# Patient Record
Sex: Female | Born: 1960
Health system: Southern US, Community
[De-identification: ages and names within clinical notes are randomized; demographics above are authoritative.]

## PROBLEM LIST (undated history)

## (undated) DIAGNOSIS — Z9221 Personal history of antineoplastic chemotherapy: Secondary | ICD-10-CM

## (undated) DIAGNOSIS — R682 Dry mouth, unspecified: Secondary | ICD-10-CM

## (undated) DIAGNOSIS — C8590 Non-Hodgkin lymphoma, unspecified, unspecified site: Secondary | ICD-10-CM

## (undated) DIAGNOSIS — Z923 Personal history of irradiation: Secondary | ICD-10-CM

## (undated) HISTORY — PX: TONSILLECTOMY: SUR1361

## (undated) HISTORY — PX: BREAST EXCISIONAL BIOPSY: SUR124

## (undated) HISTORY — PX: CHOLECYSTECTOMY: SHX55

---

## 2001-11-02 ENCOUNTER — Encounter: Admission: RE | Admit: 2001-11-02 | Discharge: 2001-11-02 | Payer: Self-pay | Admitting: Obstetrics and Gynecology

## 2001-11-02 ENCOUNTER — Encounter: Payer: Self-pay | Admitting: Obstetrics and Gynecology

## 2001-12-27 ENCOUNTER — Other Ambulatory Visit: Admission: RE | Admit: 2001-12-27 | Discharge: 2001-12-27 | Payer: Self-pay | Admitting: Obstetrics and Gynecology

## 2002-12-04 ENCOUNTER — Encounter: Admission: RE | Admit: 2002-12-04 | Discharge: 2002-12-04 | Payer: Self-pay | Admitting: Family Medicine

## 2003-01-09 ENCOUNTER — Other Ambulatory Visit: Admission: RE | Admit: 2003-01-09 | Discharge: 2003-01-09 | Payer: Self-pay | Admitting: Obstetrics and Gynecology

## 2004-02-04 ENCOUNTER — Encounter: Admission: RE | Admit: 2004-02-04 | Discharge: 2004-02-04 | Payer: Self-pay | Admitting: Family Medicine

## 2004-10-05 ENCOUNTER — Other Ambulatory Visit: Admission: RE | Admit: 2004-10-05 | Discharge: 2004-10-05 | Payer: Self-pay | Admitting: Obstetrics and Gynecology

## 2005-05-12 ENCOUNTER — Encounter: Admission: RE | Admit: 2005-05-12 | Discharge: 2005-05-12 | Payer: Self-pay | Admitting: Family Medicine

## 2005-06-16 ENCOUNTER — Encounter: Admission: RE | Admit: 2005-06-16 | Discharge: 2005-06-16 | Payer: Self-pay | Admitting: Family Medicine

## 2006-07-05 ENCOUNTER — Encounter: Admission: RE | Admit: 2006-07-05 | Discharge: 2006-07-05 | Payer: Self-pay | Admitting: Family Medicine

## 2006-11-12 ENCOUNTER — Emergency Department (HOSPITAL_COMMUNITY): Admission: EM | Admit: 2006-11-12 | Discharge: 2006-11-12 | Payer: Self-pay | Admitting: Emergency Medicine

## 2007-07-13 ENCOUNTER — Encounter: Admission: RE | Admit: 2007-07-13 | Discharge: 2007-07-13 | Payer: Self-pay | Admitting: Family Medicine

## 2010-02-15 ENCOUNTER — Encounter: Payer: Self-pay | Admitting: Family Medicine

## 2010-11-04 LAB — CBC
HCT: 35.5 — ABNORMAL LOW
MCHC: 33.1
MCV: 87.2
Platelets: 275
WBC: 5.4

## 2010-11-04 LAB — I-STAT 8, (EC8 V) (CONVERTED LAB)
Acid-Base Excess: 1
Chloride: 104
HCT: 38
Operator id: 265201
pCO2, Ven: 50.1 — ABNORMAL HIGH

## 2010-11-04 LAB — DIFFERENTIAL
Basophils Relative: 1
Eosinophils Relative: 2
Lymphocytes Relative: 48 — ABNORMAL HIGH
Monocytes Absolute: 0.4
Monocytes Relative: 7

## 2010-11-04 LAB — POCT CARDIAC MARKERS
CKMB, poc: 4.8
Myoglobin, poc: 35
Operator id: 265201
Troponin i, poc: <0.05

## 2010-11-04 LAB — POCT I-STAT CREATININE: Operator id: 265201

## 2010-12-22 ENCOUNTER — Other Ambulatory Visit: Payer: Self-pay | Admitting: Family Medicine

## 2010-12-22 ENCOUNTER — Ambulatory Visit
Admission: RE | Admit: 2010-12-22 | Discharge: 2010-12-22 | Disposition: A | Discharge status: Home / Self Care | Payer: Federal, State, Local not specified - PPO | Source: Ambulatory Visit | Attending: Family Medicine | Admitting: Family Medicine

## 2010-12-22 DIAGNOSIS — R059 Cough, unspecified: Secondary | ICD-10-CM

## 2010-12-22 DIAGNOSIS — R05 Cough: Secondary | ICD-10-CM

## 2011-03-11 ENCOUNTER — Other Ambulatory Visit: Payer: Self-pay | Admitting: Family Medicine

## 2011-03-11 DIAGNOSIS — Z1231 Encounter for screening mammogram for malignant neoplasm of breast: Secondary | ICD-10-CM

## 2011-03-17 ENCOUNTER — Ambulatory Visit
Admission: RE | Admit: 2011-03-17 | Discharge: 2011-03-17 | Disposition: A | Discharge status: Home / Self Care | Payer: Federal, State, Local not specified - PPO | Source: Ambulatory Visit | Attending: Family Medicine | Admitting: Family Medicine

## 2011-03-17 DIAGNOSIS — Z1231 Encounter for screening mammogram for malignant neoplasm of breast: Secondary | ICD-10-CM

## 2011-04-28 ENCOUNTER — Ambulatory Visit (INDEPENDENT_AMBULATORY_CARE_PROVIDER_SITE_OTHER): Payer: Federal, State, Local not specified - PPO | Admitting: Obstetrics and Gynecology

## 2011-04-28 DIAGNOSIS — Z01419 Encounter for gynecological examination (general) (routine) without abnormal findings: Secondary | ICD-10-CM

## 2011-04-28 DIAGNOSIS — Z124 Encounter for screening for malignant neoplasm of cervix: Secondary | ICD-10-CM

## 2012-08-28 ENCOUNTER — Other Ambulatory Visit: Payer: Self-pay

## 2012-08-28 DIAGNOSIS — Z1231 Encounter for screening mammogram for malignant neoplasm of breast: Secondary | ICD-10-CM

## 2012-09-08 ENCOUNTER — Ambulatory Visit
Admission: RE | Admit: 2012-09-08 | Discharge: 2012-09-08 | Disposition: A | Discharge status: Home / Self Care | Payer: Federal, State, Local not specified - PPO | Source: Ambulatory Visit

## 2012-09-08 DIAGNOSIS — Z1231 Encounter for screening mammogram for malignant neoplasm of breast: Secondary | ICD-10-CM

## 2013-04-18 ENCOUNTER — Ambulatory Visit (INDEPENDENT_AMBULATORY_CARE_PROVIDER_SITE_OTHER): Payer: Federal, State, Local not specified - PPO | Admitting: Podiatry

## 2013-04-18 ENCOUNTER — Encounter: Payer: Self-pay | Admitting: Podiatry

## 2013-04-18 VITALS — BP 122/73 | HR 69 | Resp 20 | Ht 63.0 in | Wt 198.0 lb

## 2013-04-18 DIAGNOSIS — M722 Plantar fascial fibromatosis: Secondary | ICD-10-CM

## 2013-04-18 NOTE — Patient Instructions (Addendum)

## 2013-04-19 NOTE — Progress Notes (Signed)
Subjective:     Patient ID: Charlotte Davidson, female   DOB: 1960/11/21, 53 y.o.   MRN: [id number]  HPI patient states that she is still getting discomfort in her heels and that she admits she shouldn't been here several months ago she forgot about the appointment and has not been able to pick up her orthotics   Review of Systems     Objective:   Physical Exam Neurovascular status intact with discomfort in the plantar heel region of both feet at the insertion of the tendon the calcaneus of a moderate nature    Assessment:     Continued chronic plantar fasciitis of the heel region both feet that has not responded so far to conservative care    Plan:     Orthotics dispensed with instructions and reviewed physical therapy and stretching exercises. Dispensed night splint with instructions on usage to try to reduce the inflammation she continues to experience on a chronic basis reappoint 4 weeks

## 2013-05-16 ENCOUNTER — Encounter: Payer: Self-pay | Admitting: Podiatry

## 2013-05-16 ENCOUNTER — Ambulatory Visit (INDEPENDENT_AMBULATORY_CARE_PROVIDER_SITE_OTHER): Payer: Federal, State, Local not specified - PPO | Admitting: Podiatry

## 2013-05-16 VITALS — BP 122/73 | HR 76 | Resp 12

## 2013-05-16 DIAGNOSIS — M722 Plantar fascial fibromatosis: Secondary | ICD-10-CM

## 2013-05-17 NOTE — Progress Notes (Signed)
Subjective:     Patient ID: Charlotte Davidson, female   DOB: [birth year], 53 y.o.   MRN: [id number]  HPI patient states that both of my heels are feeling quite a bit better with mild discomfort with excessive ambulation but I am walking with a better heel toe gait   Review of Systems     Objective:   Physical Exam Neurovascular status intact with minimal discomfort to palpation plantar heel bilateral and orthotics fitting well    Assessment:     Plantar fasciitis bilateral which is improving with conservative care    Plan:     Advised on physical therapy continued anti-inflammatories and orthotics and gradual increase in activity. Reappoint as needed

## 2013-10-18 ENCOUNTER — Other Ambulatory Visit: Payer: Self-pay

## 2013-10-18 DIAGNOSIS — Z1231 Encounter for screening mammogram for malignant neoplasm of breast: Secondary | ICD-10-CM

## 2013-10-25 ENCOUNTER — Ambulatory Visit
Admission: RE | Admit: 2013-10-25 | Discharge: 2013-10-25 | Disposition: A | Discharge status: Home / Self Care | Payer: Federal, State, Local not specified - PPO | Source: Ambulatory Visit

## 2013-10-25 DIAGNOSIS — Z1231 Encounter for screening mammogram for malignant neoplasm of breast: Secondary | ICD-10-CM

## 2013-10-26 ENCOUNTER — Ambulatory Visit: Payer: Federal, State, Local not specified - PPO

## 2014-10-29 ENCOUNTER — Other Ambulatory Visit: Payer: Self-pay

## 2014-10-29 DIAGNOSIS — Z1231 Encounter for screening mammogram for malignant neoplasm of breast: Secondary | ICD-10-CM

## 2014-11-08 ENCOUNTER — Ambulatory Visit
Admission: RE | Admit: 2014-11-08 | Discharge: 2014-11-08 | Disposition: A | Discharge status: Home / Self Care | Payer: 59 | Source: Ambulatory Visit

## 2014-11-08 DIAGNOSIS — Z1231 Encounter for screening mammogram for malignant neoplasm of breast: Secondary | ICD-10-CM

## 2015-01-22 ENCOUNTER — Other Ambulatory Visit: Payer: Self-pay | Admitting: Adult Health

## 2015-01-22 DIAGNOSIS — N6310 Unspecified lump in the right breast, unspecified quadrant: Secondary | ICD-10-CM

## 2015-01-24 ENCOUNTER — Other Ambulatory Visit: Payer: Self-pay | Admitting: Adult Health

## 2015-01-24 ENCOUNTER — Ambulatory Visit
Admission: RE | Admit: 2015-01-24 | Discharge: 2015-01-24 | Disposition: A | Discharge status: Home / Self Care | Payer: 59 | Source: Ambulatory Visit | Attending: Internal Medicine | Admitting: Internal Medicine

## 2015-01-24 DIAGNOSIS — N6310 Unspecified lump in the right breast, unspecified quadrant: Secondary | ICD-10-CM

## 2015-01-24 DIAGNOSIS — N6001 Solitary cyst of right breast: Secondary | ICD-10-CM

## 2015-01-24 HISTORY — PX: BREAST CYST ASPIRATION: SHX578

## 2015-01-27 LAB — CULTURE, ROUTINE-ABSCESS

## 2015-02-07 ENCOUNTER — Other Ambulatory Visit: Payer: 59

## 2015-02-10 ENCOUNTER — Ambulatory Visit
Admission: RE | Admit: 2015-02-10 | Discharge: 2015-02-10 | Disposition: A | Discharge status: Home / Self Care | Payer: 59 | Source: Ambulatory Visit | Attending: Internal Medicine | Admitting: Internal Medicine

## 2015-02-10 ENCOUNTER — Other Ambulatory Visit: Payer: Self-pay | Admitting: Adult Health

## 2015-02-10 ENCOUNTER — Ambulatory Visit: Payer: Self-pay | Admitting: Surgery

## 2015-02-10 DIAGNOSIS — N6310 Unspecified lump in the right breast, unspecified quadrant: Secondary | ICD-10-CM

## 2015-02-10 NOTE — H&P (Signed)
[facility]  Location: [facility] Patient #: [id number] DOB: 12/05/1960 Married / Language: English / Race: Black or African American Female   History of Present Illness    The patient is a 55 year old female who presents with a breast abscess.  Her PCP is Dr. Susie Cassette.  She comes by herself.   The patient was seen in [place] on 24 January 2015 for a right breast mass. She she underwent an ultrasound and mammogram. There was a 3.5 x 2.2 cm hypoechoic fluid collection at the 12 o'clock position felt to be secondary to an abscess. She underwent an aspiration. She was placed on doxycycline. Her final cultures revealed Proteus mirabilis.   She was seen back at the [place] today (02/10/2015). Repeat US showed 4.2 cm x 4.3 cm mass felt to be consistent with an abscess. She also had some enlarged right axillary lymph nodes felt to be reactive. She was referred to our office. I am seeing her in the Urgent Office. The [facility] did not push her [place] to Epic.  She has no history of prior breast abscess. She had a negative mammogram in Oct 2016 at the [facility]. She has no family history of breast cancer.  I discussed with her about continued medical vs surgical options. She has failed aspiration and antibiotics and I think needs open drainage. The mass is big enough and deep enough that I think this would best be done in the OR. I spoke with Dr. Dalbert Batman, who is our surgeon of the week at [facility], and he will do her tomorrow. Because of her allergies, I put her on cipro x 10 days. I discussed the potential complications of surgery, including, but not limited to, bleeding, recurrent abscess, nerve injury, and cosmetic changes.  Past Medical History: 1. Morbid obesity 2. Pulled her back yesterday at church. It is still bothering her. She has not seen anyone for this. 3. Remote history of cholecystectomy by Dr.  Bubba Camp  Allergies: Sulfa and Penicillin - these are remote  Social History: Married. She works at the [employer]. She has two children  Other Problems Charlotte Davidson, Charlotte Davidson; 02/10/2015 4:23 PM) Arthritis Asthma Bladder Problems Cholelithiasis Heart murmur  Past Surgical History Charlotte Davidson, Charlotte Davidson; 02/10/2015 4:23 PM) Cesarean Section - 1 Gallbladder Surgery - Laparoscopic Oral Surgery Tonsillectomy  Diagnostic Studies History Charlotte Davidson, Charlotte Davidson; 02/10/2015 4:23 PM) Colonoscopy 1-5 years ago Mammogram within last year  Allergies Charlotte Davidson, Charlotte Davidson; 02/10/2015 4:24 PM) Latex Exam Gloves *MEDICAL DEVICES AND SUPPLIES* Sulfa Antibiotics  Medication History Charlotte Davidson, Charlotte Davidson; 02/10/2015 4:25 PM) ZyrTEC Allergy (10MG  Capsule, Oral) Active. Flonase (50MCG/ACT Suspension, Nasal) Active. Medications Reconciled Multivitamins (Oral) Active.  Social History Charlotte Davidson, Charlotte Davidson; 02/10/2015 4:23 PM) Alcohol use Remotely quit alcohol use. Caffeine use Coffee, Tea. No drug use Tobacco use Never smoker.  Family History Charlotte Davidson, [place]; 02/10/2015 4:23 PM) Arthritis Mother. Cerebrovascular Accident Mother. Cervical Cancer Mother. Heart disease in female family member before age 60 Hypertension Mother. Migraine Headache Daughter.  Pregnancy / Birth History Charlotte Davidson, Charlotte Davidson; 02/10/2015 4:23 PM) Age at menarche 77 years. Gravida 2 Maternal age 62-30 Para 2  Review of Systems (Charlotte Davidson; 02/10/2015 4:23 PM) General Not Present- Appetite Loss, Chills, Fatigue, Fever, Night Sweats, Weight Gain and Weight Loss. Skin Not Present- Change in Wart/Mole, Dryness, Hives, Jaundice, New Lesions, Non-Healing Wounds, Rash and Ulcer. HEENT Present- Sinus Pain and Wears glasses/contact lenses. Not Present- Earache, Hearing Loss,  Hoarseness, Nose Bleed, Oral Ulcers, Ringing in the Ears, Seasonal Allergies, Sore Throat, Visual Disturbances and Yellow Eyes. Respiratory  Present- Snoring. Not Present- Bloody sputum, Chronic Cough, Difficulty Breathing and Wheezing. Breast Present- Breast Pain. Not Present- Breast Mass, Nipple Discharge and Skin Changes. Cardiovascular Not Present- Chest Pain, Difficulty Breathing Lying Down, Leg Cramps, Palpitations, Rapid Heart Rate, Shortness of Breath and Swelling of Extremities. Gastrointestinal Not Present- Abdominal Pain, Bloating, Bloody Stool, Change in Bowel Habits, Chronic diarrhea, Constipation, Difficulty Swallowing, Excessive gas, Gets full quickly at meals, Hemorrhoids, Indigestion, Nausea, Rectal Pain and Vomiting. Female Genitourinary Present- Frequency. Not Present- Nocturia, Painful Urination, Pelvic Pain and Urgency. Musculoskeletal Present- Back Pain. Not Present- Joint Pain, Joint Stiffness, Muscle Pain, Muscle Weakness and Swelling of Extremities. Neurological Present- Headaches. Not Present- Decreased Memory, Fainting, Numbness, Seizures, Tingling, Tremor, Trouble walking and Weakness. Psychiatric Not Present- Anxiety, Bipolar, Change in Sleep Pattern, Depression, Fearful and Frequent crying. Endocrine Present- Hot flashes. Not Present- Cold Intolerance, Excessive Hunger, Hair Changes, Heat Intolerance and New Diabetes. Hematology Not Present- Easy Bruising, Excessive bleeding, Gland problems, HIV and Persistent Infections.  Vitals (Charlotte Davidson; 02/10/2015 4:23 PM) 02/10/2015 4:23 PM Weight: 216 lb Height: 64in Body Surface Area: 2.02 m Body Mass Index: 37.08 kg/m  Temp.: 66F(Temporal)  Pulse: 81 (Regular)  BP: 124/76 (Sitting, Left Arm, Standard)  Physical Exam  General: Obese AA obese F alert and generally healthy appearing. HEENT: Normal. Pupils equal.  Neck: Supple. No mass. No thyroid mass. Lymph Nodes: No supraclavicular or cervical or axillary nodes.  Lungs: Clear to auscultation and symmetric breath sounds. Heart: RRR. No murmur or rub.  Breasts: Right - 5 cm mass in upper  right breast just beyond the areola. The skin is red, the mass is tender. Consistent with an abscess.  But I think some of this is chronic indurated tissue. Left - no mass  Abdomen: Soft. No mass. No tenderness. No hernia. Normal bowel sounds.  Extremities: Good strength and ROM in upper and lower extremities.  Neurologic: Grossly intact to motor and sensory function. Psychiatric: Has normal mood and affect. Behavior is normal.  Assessment & Plan  1.  ABSCESS OF BREAST, RIGHT (N61.1)  Impression: Failed aspiration and antibiotics.   For I&D tomorrow at [facility] with Dr. Dalbert Batman.  Current Plans:  Started Ciprofloxacin HCl 500MG , 1 (one) Tablet two times daily, #20, 02/10/2015, No Refill.   Go to short stay at [facility] at 7 AM tomorrow. Your surgery is on for 9 Am tomorrow with Dr. Leane Para.  2.  Back pain - acute  Alphonsa Overall, MD, [facility] Surgery Pager: [phone] Office phone:  [phone]

## 2015-02-11 ENCOUNTER — Encounter (HOSPITAL_COMMUNITY): Payer: Self-pay | Admitting: *Deleted

## 2015-02-11 ENCOUNTER — Ambulatory Visit (HOSPITAL_COMMUNITY): Payer: 59 | Admitting: Anesthesiology

## 2015-02-11 ENCOUNTER — Encounter (HOSPITAL_COMMUNITY)
Admission: RE | Disposition: A | Discharge status: Home / Self Care | Payer: Self-pay | Source: Ambulatory Visit | Attending: General Surgery

## 2015-02-11 ENCOUNTER — Ambulatory Visit (HOSPITAL_COMMUNITY)
Admission: RE | Admit: 2015-02-11 | Discharge: 2015-02-11 | Disposition: A | Discharge status: Home / Self Care | Payer: 59 | Source: Ambulatory Visit | Attending: General Surgery | Admitting: General Surgery

## 2015-02-11 DIAGNOSIS — M1990 Unspecified osteoarthritis, unspecified site: Secondary | ICD-10-CM | POA: Insufficient documentation

## 2015-02-11 DIAGNOSIS — Z6837 Body mass index (BMI) 37.0-37.9, adult: Secondary | ICD-10-CM | POA: Diagnosis not present

## 2015-02-11 DIAGNOSIS — N611 Abscess of the breast and nipple: Secondary | ICD-10-CM | POA: Diagnosis present

## 2015-02-11 DIAGNOSIS — J45909 Unspecified asthma, uncomplicated: Secondary | ICD-10-CM | POA: Insufficient documentation

## 2015-02-11 DIAGNOSIS — M549 Dorsalgia, unspecified: Secondary | ICD-10-CM | POA: Insufficient documentation

## 2015-02-11 HISTORY — PX: BREAST SURGERY: SHX581

## 2015-02-11 HISTORY — PX: INCISION AND DRAINAGE ABSCESS: SHX5864

## 2015-02-11 LAB — CBC WITH DIFFERENTIAL/PLATELET
BASOS ABS: 0 10*3/uL (ref 0.0–0.1)
BASOS PCT: 0 %
Eosinophils Absolute: 0.1 10*3/uL (ref 0.0–0.7)
Eosinophils Relative: 1 %
HEMATOCRIT: 38.6 % (ref 36.0–46.0)
HEMOGLOBIN: 12.5 g/dL (ref 12.0–15.0)
LYMPHS PCT: 32 %
Lymphs Abs: 2.4 10*3/uL (ref 0.7–4.0)
MCH: 27.5 pg (ref 26.0–34.0)
MCHC: 32.4 g/dL (ref 30.0–36.0)
MCV: 85 fL (ref 78.0–100.0)
MONO ABS: 0.5 10*3/uL (ref 0.1–1.0)
MONOS PCT: 7 %
NEUTROS ABS: 4.3 10*3/uL (ref 1.7–7.7)
NEUTROS PCT: 60 %
Platelets: 291 10*3/uL (ref 150–400)
RBC: 4.54 MIL/uL (ref 3.87–5.11)
RDW: 13.1 % (ref 11.5–15.5)
WBC: 7.3 10*3/uL (ref 4.0–10.5)

## 2015-02-11 SURGERY — INCISION AND DRAINAGE, ABSCESS
Anesthesia: General | Site: Breast | Laterality: Right

## 2015-02-11 MED ORDER — CHLORHEXIDINE GLUCONATE 4 % EX LIQD: 1.0000 "application " | Freq: Once | CUTANEOUS | Status: DC

## 2015-02-11 MED ORDER — LACTATED RINGERS IV SOLN
INTRAVENOUS | Status: DC
  Administered 2015-02-11: 11:00:00 via INTRAVENOUS
  Administered 2015-02-11: 1000 mL via INTRAVENOUS

## 2015-02-11 MED ORDER — ONDANSETRON HCL 4 MG/2ML IJ SOLN
INTRAMUSCULAR | Status: AC
  Filled 2015-02-11: qty 2

## 2015-02-11 MED ORDER — MIDAZOLAM HCL 5 MG/5ML IJ SOLN
INTRAMUSCULAR | Status: DC | PRN
  Administered 2015-02-11 (×3): 1 mg via INTRAVENOUS

## 2015-02-11 MED ORDER — MIDAZOLAM HCL 2 MG/2ML IJ SOLN
INTRAMUSCULAR | Status: AC
  Filled 2015-02-11: qty 2

## 2015-02-11 MED ORDER — PROPOFOL 10 MG/ML IV BOLUS
INTRAVENOUS | Status: AC
  Filled 2015-02-11: qty 20

## 2015-02-11 MED ORDER — FENTANYL CITRATE (PF) 100 MCG/2ML IJ SOLN
INTRAMUSCULAR | Status: AC
  Filled 2015-02-11: qty 2

## 2015-02-11 MED ORDER — FENTANYL CITRATE (PF) 100 MCG/2ML IJ SOLN: 25.0000 ug | INTRAMUSCULAR | Status: DC | PRN

## 2015-02-11 MED ORDER — 0.9 % SODIUM CHLORIDE (POUR BTL) OPTIME
TOPICAL | Status: DC | PRN
  Administered 2015-02-11: 2000 mL

## 2015-02-11 MED ORDER — ONDANSETRON HCL 4 MG/2ML IJ SOLN
INTRAMUSCULAR | Status: DC | PRN
  Administered 2015-02-11 (×2): 2 mg via INTRAVENOUS

## 2015-02-11 MED ORDER — LIDOCAINE HCL (CARDIAC) 20 MG/ML IV SOLN
INTRAVENOUS | Status: DC | PRN
  Administered 2015-02-11: 75 mg via INTRAVENOUS

## 2015-02-11 MED ORDER — SODIUM CHLORIDE 0.9 % IV SOLN: INTRAVENOUS | Status: DC

## 2015-02-11 MED ORDER — ACETAMINOPHEN 650 MG RE SUPP
650.0000 mg | RECTAL | Status: DC | PRN
  Filled 2015-02-11: qty 1

## 2015-02-11 MED ORDER — FENTANYL CITRATE (PF) 100 MCG/2ML IJ SOLN
INTRAMUSCULAR | Status: DC | PRN
  Administered 2015-02-11 (×2): 50 ug via INTRAVENOUS

## 2015-02-11 MED ORDER — FENTANYL CITRATE (PF) 100 MCG/2ML IJ SOLN
25.0000 ug | INTRAMUSCULAR | Status: DC | PRN
  Administered 2015-02-11: 25 ug via INTRAVENOUS

## 2015-02-11 MED ORDER — DEXAMETHASONE SODIUM PHOSPHATE 10 MG/ML IJ SOLN
INTRAMUSCULAR | Status: AC
  Filled 2015-02-11: qty 1

## 2015-02-11 MED ORDER — OXYCODONE HCL 5 MG PO TABS: 5.0000 mg | ORAL_TABLET | Freq: Once | ORAL | Status: DC | PRN

## 2015-02-11 MED ORDER — ACETAMINOPHEN 325 MG PO TABS: 650.0000 mg | ORAL_TABLET | ORAL | Status: DC | PRN

## 2015-02-11 MED ORDER — PROPOFOL 10 MG/ML IV BOLUS
INTRAVENOUS | Status: DC | PRN
  Administered 2015-02-11: 200 mg via INTRAVENOUS

## 2015-02-11 MED ORDER — CIPROFLOXACIN IN D5W 400 MG/200ML IV SOLN
INTRAVENOUS | Status: AC
  Filled 2015-02-11: qty 200

## 2015-02-11 MED ORDER — OXYCODONE HCL 5 MG/5ML PO SOLN
5.0000 mg | Freq: Once | ORAL | Status: DC | PRN
  Filled 2015-02-11: qty 5

## 2015-02-11 MED ORDER — ONDANSETRON HCL 4 MG/2ML IJ SOLN
4.0000 mg | Freq: Once | INTRAMUSCULAR | Status: AC | PRN
  Administered 2015-02-11: 4 mg via INTRAVENOUS
  Filled 2015-02-11: qty 2

## 2015-02-11 MED ORDER — SODIUM CHLORIDE 0.9 % IJ SOLN: 3.0000 mL | Freq: Two times a day (BID) | INTRAMUSCULAR | Status: DC

## 2015-02-11 MED ORDER — SODIUM CHLORIDE 0.9 % IJ SOLN: 3.0000 mL | INTRAMUSCULAR | Status: DC | PRN

## 2015-02-11 MED ORDER — CIPROFLOXACIN IN D5W 400 MG/200ML IV SOLN
400.0000 mg | INTRAVENOUS | Status: AC
  Administered 2015-02-11: 400 mg via INTRAVENOUS

## 2015-02-11 MED ORDER — OXYCODONE HCL 5 MG PO TABS: 5.0000 mg | ORAL_TABLET | ORAL | Status: DC | PRN

## 2015-02-11 MED ORDER — HYDROCODONE-ACETAMINOPHEN 5-325 MG PO TABS: 1.0000 | ORAL_TABLET | Freq: Four times a day (QID) | ORAL | Status: DC | PRN

## 2015-02-11 MED ORDER — DEXAMETHASONE SODIUM PHOSPHATE 10 MG/ML IJ SOLN
INTRAMUSCULAR | Status: DC | PRN
  Administered 2015-02-11: 10 mg via INTRAVENOUS

## 2015-02-11 MED ORDER — BUPIVACAINE-EPINEPHRINE (PF) 0.5% -1:200000 IJ SOLN
INTRAMUSCULAR | Status: AC
  Filled 2015-02-11: qty 30

## 2015-02-11 MED ORDER — FENTANYL CITRATE (PF) 250 MCG/5ML IJ SOLN
INTRAMUSCULAR | Status: AC
Start: 2015-02-11 — End: 2015-02-11
  Filled 2015-02-11: qty 5

## 2015-02-11 MED ORDER — SODIUM CHLORIDE 0.9 % IV SOLN: 250.0000 mL | INTRAVENOUS | Status: DC | PRN

## 2015-02-11 MED ORDER — BUPIVACAINE-EPINEPHRINE 0.5% -1:200000 IJ SOLN
INTRAMUSCULAR | Status: DC | PRN
  Administered 2015-02-11: 10 mL

## 2015-02-11 MED ORDER — PHENYLEPHRINE HCL 10 MG/ML IJ SOLN
INTRAMUSCULAR | Status: DC | PRN
  Administered 2015-02-11 (×4): 40 ug via INTRAVENOUS

## 2015-02-11 MED ORDER — LIDOCAINE HCL (CARDIAC) 20 MG/ML IV SOLN
INTRAVENOUS | Status: AC
  Filled 2015-02-11: qty 5

## 2015-02-11 SURGICAL SUPPLY — 30 items
BINDER BREAST XLRG (GAUZE/BANDAGES/DRESSINGS) ×1 IMPLANT
BLADE SURG 15 STRL LF DISP TIS (BLADE) ×1 IMPLANT
BLADE SURG 15 STRL SS (BLADE) ×2
BNDG GAUZE ELAST 4 BULKY (GAUZE/BANDAGES/DRESSINGS) IMPLANT
COVER SURGICAL LIGHT HANDLE (MISCELLANEOUS) ×2 IMPLANT
DECANTER SPIKE VIAL GLASS SM (MISCELLANEOUS) IMPLANT
DRAPE LAPAROSCOPIC ABDOMINAL (DRAPES) IMPLANT
DRSG PAD ABDOMINAL 8X10 ST (GAUZE/BANDAGES/DRESSINGS) ×1 IMPLANT
ELECT PENCIL ROCKER SW 15FT (MISCELLANEOUS) ×2 IMPLANT
ELECT REM PT RETURN 9FT ADLT (ELECTROSURGICAL) ×2
ELECTRODE REM PT RTRN 9FT ADLT (ELECTROSURGICAL) ×1 IMPLANT
GAUZE SPONGE 4X4 12PLY STRL (GAUZE/BANDAGES/DRESSINGS) IMPLANT
GLOVE SURG SIGNA 7.5 PF LTX (GLOVE) ×2 IMPLANT
GOWN STRL REUS W/TWL XL LVL3 (GOWN DISPOSABLE) ×4 IMPLANT
KIT BASIN OR (CUSTOM PROCEDURE TRAY) ×2 IMPLANT
NDL HYPO 25X1 1.5 SAFETY (NEEDLE) IMPLANT
NEEDLE HYPO 25X1 1.5 SAFETY (NEEDLE) IMPLANT
PACK GENERAL/GYN (CUSTOM PROCEDURE TRAY) ×1 IMPLANT
PACKING VAGINAL (PACKING) ×1 IMPLANT
SPONGE LAP 18X18 X RAY DECT (DISPOSABLE) ×2 IMPLANT
SUT MNCRL AB 4-0 PS2 18 (SUTURE) IMPLANT
SUT VIC AB 3-0 SH 27 (SUTURE)
SUT VIC AB 3-0 SH 27XBRD (SUTURE) IMPLANT
SWAB COLLECTION DEVICE MRSA (MISCELLANEOUS) IMPLANT
SWAB CULTURE ESWAB REG 1ML (MISCELLANEOUS) IMPLANT
SYR 20CC LL (SYRINGE) ×2 IMPLANT
SYR BULB IRRIGATION 50ML (SYRINGE) ×2 IMPLANT
TOWEL OR 17X26 10 PK STRL BLUE (TOWEL DISPOSABLE) ×2 IMPLANT
TOWEL OR NON WOVEN STRL DISP B (DISPOSABLE) ×2 IMPLANT
YANKAUER SUCT BULB TIP NO VENT (SUCTIONS) ×2 IMPLANT

## 2015-02-11 NOTE — Transfer of Care (Signed)
Immediate Anesthesia Transfer of Care Note  Patient: Charlotte Davidson  Procedure(s) Performed: Procedure(s): INCISION AND DRAINAGE BREAST ABSCESS, RIGHT  BREAST BIOPSY (Right)  Patient Location: PACU  Anesthesia Type:General  Level of Consciousness: awake, oriented, patient cooperative, lethargic and responds to stimulation  Airway & Oxygen Therapy: Patient Spontanous Breathing and Patient connected to face mask oxygen  Post-op Assessment: Report given to RN, Post -op Vital signs reviewed and stable and Patient moving all extremities  Post vital signs: Reviewed and stable  Last Vitals:  Filed Vitals:   02/11/15 0727  BP: 127/81  Pulse: 88  Temp: 36.8 C  Resp: 18    Complications: No apparent anesthesia complications

## 2015-02-11 NOTE — Interval H&P Note (Signed)
History and Physical Interval Note:  02/11/2015 8:40 AM  Charlotte Davidson  has presented today for surgery, with the diagnosis of right breast abscess  The various methods of treatment have been discussed with the patient and family. After consideration of risks, benefits and other options for treatment, the patient has consented to  Procedure(s): INCISION AND DRAINAGE BREAST ABSCESS (Right) as a surgical intervention .  The patient's history has been reviewed, patient examined, no change in status, stable for surgery.  I have reviewed the patient's chart and labs.  Questions were answered to the patient's satisfaction.   She has a large area of cellulitis and induration in the right breast in the upper periareolar area.  This has not responded to multiple interventions.  Dr. Lucia Gaskins saw her in the office yesterday and asked me to drain this in the OR today. I have explained to her that there are risk of bleeding, recurrent infection, cosmetic deformity, skin necrosis, numbness, chronic pain. I also told her that we are going to do a biopsy although I do not think this is cancer.  Mammograms are reportedly normal in October 2016.  This has been a recent process and so is almost certainly inflammatory.  She understands these issues.  All of her questions are answered.  She agrees with this plan.   Adin Hector

## 2015-02-11 NOTE — Anesthesia Postprocedure Evaluation (Signed)
Anesthesia Post Note  Patient: Charlotte Davidson  Procedure(s) Performed: Procedure(s) (LRB): INCISION AND DRAINAGE BREAST ABSCESS, RIGHT  BREAST BIOPSY (Right)  Patient location during evaluation: PACU Anesthesia Type: General Level of consciousness: awake and awake and alert Pain management: pain level controlled Vital Signs Assessment: post-procedure vital signs reviewed and stable Respiratory status: spontaneous breathing and nonlabored ventilation Cardiovascular status: blood pressure returned to baseline and stable Anesthetic complications: no    Last Vitals:  Filed Vitals:   02/11/15 1117 02/11/15 1316  BP: 116/64 108/63  Pulse: 70 75  Temp: 36.7 C 36.6 C  Resp: 12 16    Last Pain:  Filed Vitals:   02/11/15 1316  PainSc: 4                  Adie Vilar COKER

## 2015-02-11 NOTE — Discharge Instructions (Signed)
Leave all of the bandages in place into you are seen in the office on Thursday or Friday  If there is any significant bleeding, give [place] a call  You will need to take sponge baths into you see Dr. Lucia Gaskins later this weekGeneral Anesthesia, Adult, Care After Refer to this sheet in the next few weeks. These instructions provide you with information on caring for yourself after your procedure. Your health care provider may also give you more specific instructions. Your treatment has been planned according to current medical practices, but problems sometimes occur. Call your health care provider if you have any problems or questions after your procedure. WHAT TO EXPECT AFTER THE PROCEDURE After the procedure, it is typical to experience:  Sleepiness.  Nausea and vomiting. HOME CARE INSTRUCTIONS  For the first 24 hours after general anesthesia:  Have a responsible person with you.  Do not drive a car. If you are alone, do not take public transportation.  Do not drink alcohol.  Do not take medicine that has not been prescribed by your health care provider.  Do not sign important papers or make important decisions.  You may resume a normal diet and activities as directed by your health care provider.  Change bandages (dressings) as directed.  If you have questions or problems that seem related to general anesthesia, call the hospital and ask for the anesthetist or anesthesiologist on call. SEEK MEDICAL CARE IF:  You have nausea and vomiting that continue the day after anesthesia.  You develop a rash. SEEK IMMEDIATE MEDICAL CARE IF:   You have difficulty breathing.  You have chest pain.  You have any allergic problems.   This information is not intended to replace advice given to you by your health care provider. Make sure you discuss any questions you have with your health care provider.   Document Released: 04/19/2000 Document Revised: 02/01/2014 Document Reviewed:  05/12/2011 Elsevier Interactive Patient Education [employer].

## 2015-02-11 NOTE — H&P (View-Only) (Signed)
[facility]  Location: [facility] Patient #: [id number] DOB: [birth year] Married / Language: English / Race: Black or African American Female   History of Present Illness    The patient is a 55 year old female who presents with a breast abscess.  Her PCP is Dr. Susie Cassette.  She comes by herself.   The patient was seen in [place] on 24 January 2015 for a right breast mass. She she underwent an ultrasound and mammogram. There was a 3.5 x 2.2 cm hypoechoic fluid collection at the 12 o'clock position felt to be secondary to an abscess. She underwent an aspiration. She was placed on doxycycline. Her final cultures revealed Proteus mirabilis.   She was seen back at the [place] today (02/10/2015). Repeat US showed 4.2 cm x 4.3 cm mass felt to be consistent with an abscess. She also had some enlarged right axillary lymph nodes felt to be reactive. She was referred to our office. I am seeing her in the Urgent Office. The [facility] Center did not push her [place] to Epic.  She has no history of prior breast abscess. She had a negative mammogram in Oct 2016 at the [facility]. She has no family history of breast cancer.  I discussed with her about continued medical vs surgical options. She has failed aspiration and antibiotics and I think needs open drainage. The mass is big enough and deep enough that I think this would best be done in the OR. I spoke with Dr. Dalbert Batman, who is our surgeon of the week at [facility], and he will do her tomorrow. Because of her allergies, I put her on cipro x 10 days. I discussed the potential complications of surgery, including, but not limited to, bleeding, recurrent abscess, nerve injury, and cosmetic changes.  Past Medical History: 1. Morbid obesity 2. Pulled her back yesterday at church. It is still bothering her. She has not seen anyone for this. 3. Remote history of cholecystectomy by Dr.  Bubba Camp  Allergies: Sulfa and Penicillin - these are remote  Social History: Married. She works at the [employer]. She has two children  Other Problems Marjean Donna, [place]; 02/10/2015 4:23 PM) Arthritis Asthma Bladder Problems Cholelithiasis Heart murmur  Past Surgical History Marjean Donna, CMA; 02/10/2015 4:23 PM) Cesarean Section - 1 Gallbladder Surgery - Laparoscopic Oral Surgery Tonsillectomy  Diagnostic Studies History Marjean Donna, CMA; 02/10/2015 4:23 PM) Colonoscopy 1-5 years ago Mammogram within last year  Allergies Marjean Donna, CMA; 02/10/2015 4:24 PM) Latex Exam Gloves *MEDICAL DEVICES AND SUPPLIES* Sulfa Antibiotics  Medication History Marjean Donna, CMA; 02/10/2015 4:25 PM) ZyrTEC Allergy (10MG  Capsule, Oral) Active. Flonase (50MCG/ACT Suspension, Nasal) Active. Medications Reconciled Multivitamins (Oral) Active.  Social History Marjean Donna, [place]; 02/10/2015 4:23 PM) Alcohol use Remotely quit alcohol use. Caffeine use Coffee, Tea. No drug use Tobacco use Never smoker.  Family History Marjean Donna, [place]; 02/10/2015 4:23 PM) Arthritis Mother. Cerebrovascular Accident Mother. Cervical Cancer Mother. Heart disease in female family member before age 72 Hypertension Mother. Migraine Headache Daughter.  Pregnancy / Birth History Marjean Donna, [place]; 02/10/2015 4:23 PM) Age at menarche 21 years. Gravida 2 Maternal age 68-30 Para 2  Review of Systems ([place]; 02/10/2015 4:23 PM) General Not Present- Appetite Loss, Chills, Fatigue, Fever, Night Sweats, Weight Gain and Weight Loss. Skin Not Present- Change in Wart/Mole, Dryness, Hives, Jaundice, New Lesions, Non-Healing Wounds, Rash and Ulcer. HEENT Present- Sinus Pain and Wears glasses/contact lenses. Not Present- Earache, Hearing Loss,  Hoarseness, Nose Bleed, Oral Ulcers, Ringing in the Ears, Seasonal Allergies, Sore Throat, Visual Disturbances and Yellow Eyes. Respiratory  Present- Snoring. Not Present- Bloody sputum, Chronic Cough, Difficulty Breathing and Wheezing. Breast Present- Breast Pain. Not Present- Breast Mass, Nipple Discharge and Skin Changes. Cardiovascular Not Present- Chest Pain, Difficulty Breathing Lying Down, Leg Cramps, Palpitations, Rapid Heart Rate, Shortness of Breath and Swelling of Extremities. Gastrointestinal Not Present- Abdominal Pain, Bloating, Bloody Stool, Change in Bowel Habits, Chronic diarrhea, Constipation, Difficulty Swallowing, Excessive gas, Gets full quickly at meals, Hemorrhoids, Indigestion, Nausea, Rectal Pain and Vomiting. Female Genitourinary Present- Frequency. Not Present- Nocturia, Painful Urination, Pelvic Pain and Urgency. Musculoskeletal Present- Back Pain. Not Present- Joint Pain, Joint Stiffness, Muscle Pain, Muscle Weakness and Swelling of Extremities. Neurological Present- Headaches. Not Present- Decreased Memory, Fainting, Numbness, Seizures, Tingling, Tremor, Trouble walking and Weakness. Psychiatric Not Present- Anxiety, Bipolar, Change in Sleep Pattern, Depression, Fearful and Frequent crying. Endocrine Present- Hot flashes. Not Present- Cold Intolerance, Excessive Hunger, Hair Changes, Heat Intolerance and New Diabetes. Hematology Not Present- Easy Bruising, Excessive bleeding, Gland problems, HIV and Persistent Infections.  Vitals (Sonya Bynum CMA; 02/10/2015 4:23 PM) 02/10/2015 4:23 PM Weight: 216 lb Height: 64in Body Surface Area: 2.02 m Body Mass Index: 37.08 kg/m  Temp.: 56F(Temporal)  Pulse: 81 (Regular)  BP: 124/76 (Sitting, Left Arm, Standard)  Physical Exam  General: Obese AA obese F alert and generally healthy appearing. HEENT: Normal. Pupils equal.  Neck: Supple. No mass. No thyroid mass. Lymph Nodes: No supraclavicular or cervical or axillary nodes.  Lungs: Clear to auscultation and symmetric breath sounds. Heart: RRR. No murmur or rub.  Breasts: Right - 5 cm mass in upper  right breast just beyond the areola. The skin is red, the mass is tender. Consistent with an abscess.  But I think some of this is chronic indurated tissue. Left - no mass  Abdomen: Soft. No mass. No tenderness. No hernia. Normal bowel sounds.  Extremities: Good strength and ROM in upper and lower extremities.  Neurologic: Grossly intact to motor and sensory function. Psychiatric: Has normal mood and affect. Behavior is normal.  Assessment & Plan  1.  ABSCESS OF BREAST, RIGHT (N61.1)  Impression: Failed aspiration and antibiotics.   For I&D tomorrow at [facility] with Dr. Dalbert Batman.  Current Plans:  Started Ciprofloxacin HCl 500MG , 1 (one) Tablet two times daily, #20, 02/10/2015, No Refill.   Go to short stay at [facility] at 7 AM tomorrow. Your surgery is on for 9 Am tomorrow with Dr. Leane Para.  2.  Back pain - acute  Alphonsa Overall, MD, [facility] Surgery Pager: [phone] Office phone:  [phone]

## 2015-02-11 NOTE — Anesthesia Procedure Notes (Signed)
Procedure Name: LMA Insertion Date/Time: 02/11/2015 9:28 AM Performed by: Ofilia Neas Pre-anesthesia Checklist: Patient identified, Emergency Drugs available, Suction available, Timeout performed and Patient being monitored Patient Re-evaluated:Patient Re-evaluated prior to inductionOxygen Delivery Method: Circle system utilized Preoxygenation: Pre-oxygenation with 100% oxygen Intubation Type: IV induction LMA: LMA inserted LMA Size: 4.0 Number of attempts: 1 Placement Confirmation: positive ETCO2 and breath sounds checked- equal and bilateral Tube secured with: Tape Dental Injury: Teeth and Oropharynx as per pre-operative assessment

## 2015-02-11 NOTE — Op Note (Signed)
Patient Name:           Charlotte Davidson   Date of Surgery:        02/11/2015  Pre op Diagnosis:    Recurrent right breast abscess    Post op Diagnosis:    Same  Procedure:                 Incision and drainage of right breast abscess, right breast biopsy  Surgeon:                     Edsel Petrin. Dalbert Batman, M.D., FACS  Assistant:                      Or staff  Operative Indications:   The patient was seen in [place] on 24 January 2015 for a right breast mass. She she underwent an ultrasound and mammogram. There was a 3.5 x 2.2 cm hypoechoic fluid collection at the 12 o'clock position felt to be secondary to an abscess. She underwent an aspiration. She was placed on doxycycline. Her final cultures revealed Proteus mirabilis.  She was seen back at the [facility] today (02/10/2015). Repeat US showed 4.2 cm x 4.3 cm mass felt to be consistent with an abscess. She also had some enlarged right axillary lymph nodes felt to be reactive. She was referred to our office.  She was seen in the office by Dr. Alphonsa Overall yesterday, who felt that she needed to undergo drainage in the operating room.  He called me and I agreed to assume her care today.  Dr. Lucia Gaskins has placed her on Cipro because she is allergic to penicillin and sulfa drugs.. She has no history of prior breast abscess. She had a negative mammogram in Oct 2016 at the [facility]. She has no family history of breast cancer.   Operative Findings:       The patient had a deep breast abscess which was yellowish tan cloudy purulent fluid.  I evacuated about 100 mL or more and cultures were taken.  I also biopsied the wall of the abscess cavity which felt like benign inflammatory tissue.  Procedure in Detail:          Following the induction of general LMA anesthesia the patient's right breast was prepped and draped in a sterile fashion.  Surgical timeout was performed.  Intravenous antibodies were given.   0.5% Marcaine with epinephrine was used as local infiltration anesthetic.  The abscess was palpable mostly above but a little bit below the areolar margin in the upper outer quadrant.  I made incision at the areolar margin and took the dissection down and found the abscess deep within the breast.  This was completely evacuated and all of the cultures were broken up and cultures were taken.  Hemostasis was very good and she was electrocautery.  I irrigated the wound extensively.  I did a generous biopsy of the upper rim of the breast parenchyma and sent that for routine histology.  Wound was irrigated further.  Once I was satisfied with hemostasis and wound exploration I packed it with 2 image vaginal packing.  Observation revealed hemostasis.  Bulky bandage and breast binder were placed.  The patient tolerated the procedure well was taken to PACU in stable condition.  EBL 25 mL.  Counts correct.  Complications none.     Edsel Petrin. Dalbert Batman, M.D., FACS General and Minimally Invasive Surgery Breast and Colorectal Surgery  02/11/2015  10:00 AM

## 2015-02-11 NOTE — Anesthesia Preprocedure Evaluation (Addendum)
Anesthesia Evaluation  Patient identified by MRN, date of birth, ID band Patient awake    Reviewed: Allergy & Precautions, NPO status   Airway Mallampati: II  TM Distance: >3 FB Neck ROM: Full    Dental  (+) Teeth Intact, Dental Advisory Given   Pulmonary    breath sounds clear to auscultation       Cardiovascular  Rhythm:Regular Rate:Normal     Neuro/Psych    GI/Hepatic   Endo/Other    Renal/GU      Musculoskeletal   Abdominal   Peds  Hematology   Anesthesia Other Findings   Reproductive/Obstetrics                            Anesthesia Physical Anesthesia Plan  ASA: III  Anesthesia Plan: General   Post-op Pain Management:    Induction: Intravenous  Airway Management Planned: LMA  Additional Equipment:   Intra-op Plan:   Post-operative Plan: Extubation in OR  Informed Consent: I have reviewed the patients History and Physical, chart, labs and discussed the procedure including the risks, benefits and alternatives for the proposed anesthesia with the patient or authorized representative who has indicated his/her understanding and acceptance.   Dental advisory given  Plan Discussed with: CRNA and Anesthesiologist  Anesthesia Plan Comments:        Anesthesia Quick Evaluation

## 2015-02-12 NOTE — Progress Notes (Signed)
Quick Note:  Inform patient of Pathology report,. Tell her that the breast tissue showed benign findings. No evidence of malignancy. This is good news. She had surgery on Tuesday to drain an abscess and biopsy of a breast mass.  hmi ______

## 2015-02-13 LAB — CULTURE, ROUTINE-ABSCESS

## 2015-02-28 ENCOUNTER — Other Ambulatory Visit: Payer: 59

## 2015-09-24 ENCOUNTER — Other Ambulatory Visit: Payer: Self-pay | Admitting: Otolaryngology

## 2015-09-24 DIAGNOSIS — J392 Other diseases of pharynx: Secondary | ICD-10-CM | POA: Insufficient documentation

## 2015-09-24 DIAGNOSIS — H6982 Other specified disorders of Eustachian tube, left ear: Secondary | ICD-10-CM | POA: Insufficient documentation

## 2015-09-24 DIAGNOSIS — R590 Localized enlarged lymph nodes: Secondary | ICD-10-CM | POA: Insufficient documentation

## 2015-09-24 DIAGNOSIS — J3489 Other specified disorders of nose and nasal sinuses: Secondary | ICD-10-CM | POA: Insufficient documentation

## 2015-09-26 ENCOUNTER — Other Ambulatory Visit: Payer: Self-pay | Admitting: Otolaryngology

## 2015-09-26 DIAGNOSIS — R590 Localized enlarged lymph nodes: Secondary | ICD-10-CM

## 2015-09-30 ENCOUNTER — Ambulatory Visit
Admission: RE | Admit: 2015-09-30 | Discharge: 2015-09-30 | Disposition: A | Discharge status: Home / Self Care | Payer: 59 | Source: Ambulatory Visit | Attending: Otolaryngology | Admitting: Otolaryngology

## 2015-09-30 ENCOUNTER — Other Ambulatory Visit: Payer: Self-pay | Admitting: Hematology and Oncology

## 2015-09-30 ENCOUNTER — Telehealth: Payer: Self-pay | Admitting: Hematology and Oncology

## 2015-09-30 DIAGNOSIS — R590 Localized enlarged lymph nodes: Secondary | ICD-10-CM

## 2015-09-30 MED ORDER — IOPAMIDOL (ISOVUE-300) INJECTION 61%
75.0000 mL | Freq: Once | INTRAVENOUS | Status: AC | PRN
  Administered 2015-09-30: 75 mL via INTRAVENOUS

## 2015-09-30 NOTE — Telephone Encounter (Signed)
Pt aware of np appt on 10/01/15@11 :30

## 2015-10-01 ENCOUNTER — Ambulatory Visit (HOSPITAL_BASED_OUTPATIENT_CLINIC_OR_DEPARTMENT_OTHER): Payer: 59 | Admitting: Hematology and Oncology

## 2015-10-01 ENCOUNTER — Telehealth: Payer: Self-pay | Admitting: Hematology and Oncology

## 2015-10-01 ENCOUNTER — Encounter: Payer: Self-pay | Admitting: Hematology and Oncology

## 2015-10-01 ENCOUNTER — Telehealth: Payer: Self-pay | Admitting: *Deleted

## 2015-10-01 ENCOUNTER — Other Ambulatory Visit: Payer: Self-pay | Admitting: Hematology and Oncology

## 2015-10-01 DIAGNOSIS — C8331 Diffuse large B-cell lymphoma, lymph nodes of head, face, and neck: Secondary | ICD-10-CM | POA: Diagnosis not present

## 2015-10-01 NOTE — Assessment & Plan Note (Signed)
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 We discussed the importance of staging of disease. The patient is morbidly obese which would make bone marrow biopsy technically difficult. I placed order for PET CT scan for staging. If PET CT scan showed localized disease in the head and neck region, I would not proceed with bone marrow biopsy. I have placed order for PET scan, echocardiogram, blood work, chemotherapy teaching class, port placement and I plan to see her back next week to review all test results. I plan to start her on cycle one of RCHOP chemotherapy on 10/13/2015.

## 2015-10-01 NOTE — Telephone Encounter (Signed)
AVS REPORT AND SCHD GIVEN PER 10/01/15 LOS. MSG SENT TO CHEMO SCHD PER 10/13/15 rchop TO BE ADDED.

## 2015-10-01 NOTE — Progress Notes (Signed)
[facility] NOTE  Patient Care Team: Haywood Pao, MD as PCP - General (Internal Medicine) Heath Lark, MD as Consulting Physician (Hematology and Oncology) Jodi Marble, MD as Consulting Physician (Otolaryngology)  CHIEF COMPLAINTS/PURPOSE OF CONSULTATION:  Newly diagnosed lymphoma  HISTORY OF PRESENTING ILLNESS:  Charlotte Davidson 55 y.o. female is here because of recent diagnosis of lymphoma. She is here today accompanied by her husband, Charlotte Davidson.  The patient is in excellent health until approximately 3 weeks ago. She complained of congestion with mild hearing problem over the left ear, diffuse swelling over the right neck and some nasal drainage. These symptoms has been going on for about 3 weeks. She was subsequently referred to ENT for evaluation. She had evaluation on 09/24/2015 with flexible laryngoscopy. A nasopharyngeal mass is noted and biopsy was obtained. She also have fine-needle aspirate of the right neck mass. Subsequently, CT imaging was performed. Summary of oncologic history is as follows:   Diffuse large B-cell lymphoma of lymph nodes of neck ([place])   09/24/2015 Pathology Results    Final Cytologic Interpretation [id number]: Right level II neck mass, Fine Needle Aspiration I (smears and Thinprep): Malignant cells present, most consistent with large cell lymphoma.       09/24/2015 Pathology Results    [phone] The nasopharyngeal biopsy shows lymphoid tissue with diffuse effacement of the normal nodal architecture by large cells with vesicular nuclei, prominent nucleoli, and scant cytoplasm. The large cells are positive for CD20, BCL-6, BCL-2, CD5, and MUM1 by immunohistochemistry. They are negative for CD30, CD3, CD10, cyclinD1 and SOX11. In situ hybridization for EBV mRNA is negative. Ki-67 reveals a proliferation rate of approximately 60%. By flow cytometry, the atypical cells express CD19, CD5, and CD23 (see below). The expression of CD5 and  CD23 by flow cytometry is suggestive of small lymphocytic lymphoma/ chronic lymphocytic leukemia (SLL/CLL) transforming to a diffuse large B-cell lymphoma (DLBCL); however, there was no evidence of SLL/CLL in the flow sample. Correlation with peripheral blood/ marrow findings would be useful to determine if this lymphoma arose from background SLL/CLL or is a de novo CD5+ DLBCL. De novo CD5+ DLBCL is frequently associated with an aggressive clinical course and poor response to chemotherapy. A pleomorphic mantle cell lymphoma is less likely since the cells are negative for Cyclin D1 and SOX11. FISH studies are pending and will be reported as an addendum.       09/30/2015 Imaging    CT scan of neck showed bulky RIGHT neck lymphadenopathy concerning for metastatic disease. 8 mm submucosal RIGHT base of tongue mass, considering ipsilateral lymphadenopathy, this is suspicious for primary head and neck cancer. Isodense LEFT palatine tonsillar mass versus focal lymphoid hyperplasia resulting in LEFT postobstructive middle ear/mastoid effusion.       Currently, she continues to have very mild deficit of hearing over the left side. She denies recent cough. She denies diagnosed dysphagia, abnormal night sweats, weight loss or skin itching. She denies other areas of lymphadenopathy  MEDICAL HISTORY:  No past medical history on file.  SURGICAL HISTORY: Past Surgical History:  Procedure Laterality Date  . BREAST SURGERY  01.17.2017   incise and drain right breast abscess  . CESAREAN SECTION  09.10.1996  . CHOLECYSTECTOMY    . INCISION AND DRAINAGE ABSCESS Right 02/11/2015   Procedure: INCISION AND DRAINAGE BREAST ABSCESS, RIGHT  BREAST BIOPSY;  Surgeon: Fanny Skates, MD;  Location: [facility];  Service: General;  Laterality: Right;  . TONSILLECTOMY  SOCIAL HISTORY: Social History   Social History  . Marital status: Married    Spouse name: Charlotte Davidson  . Number of children: 2  . Years of education:  N/A   Occupational History  . Tour manager    Social History Main Topics  . Smoking status: Never Smoker  . Smokeless tobacco: Never Used  . Alcohol use No  . Drug use: No  . Sexual activity: Not on file   Other Topics Concern  . Not on file   Social History Narrative  . No narrative on file    FAMILY HISTORY: Family History  Problem Relation Age of Onset  . Cancer Father     ?throat ca  . Stroke Mother   . Hypertension Mother     ALLERGIES:  is allergic to latex; penicillins; and sulfa antibiotics.  MEDICATIONS:  Current Outpatient Prescriptions  Medication Sig Dispense Refill  . Ascorbic Acid (VITAMIN C PO) Take 1 tablet by mouth daily.    . Cholecalciferol (VITAMIN D PO) Take 1 tablet by mouth daily.    . Multiple Vitamins-Minerals (MULTIVITAMIN WITH MINERALS) tablet Take 1 tablet by mouth daily.     No current facility-administered medications for this visit.     REVIEW OF SYSTEMS:   Constitutional: Denies fevers, chills or abnormal night sweats Eyes: Denies blurriness of vision, double vision or watery eyes Ears, nose, mouth, throat, and face: Denies mucositis or sore throat Respiratory: Denies cough, dyspnea or wheezes Cardiovascular: Denies palpitation, chest discomfort or lower extremity swelling Gastrointestinal:  Denies nausea, heartburn or change in bowel habits Skin: Denies abnormal skin rashes Neurological:Denies numbness, tingling or new weaknesses Behavioral/Psych: Mood is stable, no new changes  All other systems were reviewed with the patient and are negative.  PHYSICAL EXAMINATION: ECOG PERFORMANCE STATUS: 1 - Symptomatic but completely ambulatory  Vitals:   10/01/15 1152  BP: 129/78  Pulse: 70  Resp: 18  Temp: 98 F (36.7 C)   Filed Weights   10/01/15 1152  Weight: 214 lb (97.1 kg)    GENERAL:alert, no distress and comfortable. She is obese SKIN: skin color, texture, turgor are normal, no rashes or significant lesions EYES:  normal, conjunctiva are pink and non-injected, sclera clear OROPHARYNX:no exudate, no erythema and lips, buccal mucosa, and tongue normal  NECK: supple, thyroid normal size, non-tender, without nodularity LYMPH:  She has significant palpable lymphadenopathy bilaterally and also fullness on examination over the left axilla  LUNGS: clear to auscultation and percussion with normal breathing effort HEART: regular rate & rhythm and no murmurs and no lower extremity edema ABDOMEN:abdomen soft, non-tender and normal bowel sounds Musculoskeletal:no cyanosis of digits and no clubbing  PSYCH: alert & oriented x 3 with fluent speech NEURO: no focal motor/sensory deficits  LABORATORY DATA:  I have reviewed the data as listed Lab Results  Component Value Date   WBC 7.3 02/11/2015   HGB 12.5 02/11/2015   HCT 38.6 02/11/2015   MCV 85.0 02/11/2015   PLT 291 02/11/2015   No results for input(s): NA, K, CL, CO2, GLUCOSE, BUN, CREATININE, CALCIUM, GFRNONAA, GFRAA, PROT, ALBUMIN, AST, ALT, ALKPHOS, BILITOT, BILIDIR, IBILI in the last 8760 hours.  RADIOGRAPHIC STUDIES: I have personally reviewed the radiological images as listed and agreed with the findings in the report. Ct Soft Tissue Neck W Contrast  Result Date: 09/30/2015 CLINICAL DATA:  RIGHT submandibular swelling for 2 weeks, sinonasal mass seen on endoscopy. Evaluate cervical lymphadenopathy. EXAM: CT NECK WITH CONTRAST TECHNIQUE: Multidetector CT imaging of the  neck was performed using the standard protocol following the bolus administration of intravenous contrast. CONTRAST:  21m ISOVUE-300 IOPAMIDOL (ISOVUE-300) INJECTION 61% COMPARISON:  None. FINDINGS: Pharynx and larynx: Enlarged adenoidal soft tissues. LEFT palatine tonsillar submucosal isodense mass, approximate 21 x 19 x 28 mm (volume = 5800 mm^3). There is partial effacement of the airway, which remains patent. 8 mm hypodense mass RIGHT base of tongue (axial 45/96). Normal appearance of the  larynx. Salivary glands: Normal. Thyroid: Normal. Lymph nodes: Bulky predominantly RIGHT-sided lymphadenopathy, including RIGHT level IIa 28 x 29 mm nodal conglomeration and 22 x 30 mm RIGHT level IIb nodal conglomeration with necrosis. 11 mm short axis RIGHT lateral pharyngeal lymph node. Smaller though pathologically enlarged RIGHT level 3 lymph nodes. Vascular: Mass effect and partial effacement of the RIGHT internal jugular vein which remains patent. Medial displacement of the RIGHT carotid artery due to mass effect. Limited intracranial: Normal. Visualized orbits: Normal. Mastoids and visualized paranasal sinuses: LEFT middle ear in mastoid effusion. Mucosal thickening bilateral sphenoid sinuses. Skeleton: Moderate C3-4 thru C5-6 degenerative discs. No destructive bony lesions. Upper chest: Lung apices are clear. No superior mediastinal lymphadenopathy. IMPRESSION: Bulky RIGHT neck lymphadenopathy concerning for metastatic disease. 8 mm submucosal RIGHT base of tongue mass, considering ipsilateral lymphadenopathy, this is suspicious for primary head and neck cancer. Isodense LEFT palatine tonsillar mass versus focal lymphoid hyperplasia resulting in LEFT postobstructive middle ear/mastoid effusion. These results will be called to the ordering clinician or representative by the Radiologist Assistant, and communication documented in the PACS or zVision Dashboard. Electronically Signed   By: CElon AlasM.D.   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 We discussed the importance of staging of disease. The patient is morbidly obese which would make bone marrow biopsy technically difficult. I placed order for PET CT scan for staging. If PET CT scan showed localized disease in the head and neck region, I would not proceed  with bone marrow biopsy. I have placed order for PET scan, echocardiogram, blood work, chemotherapy teaching class, port placement and I plan to see her back next week to review all test results. I plan to start her on cycle one of RCHOP chemotherapy on 10/13/2015.   Orders Placed This Encounter  Procedures  . NM PET Image Initial (PI) Skull Base To Thigh    Standing Status:   Future    Standing Expiration Date:   11/04/2016    Order Specific Question:   Reason for exam:    Answer:   lymphoma staging    Order Specific Question:   Preferred imaging location?    Answer:   [facility]  . IR Fluoro Guide CV Line Right    Indicate type of CVC ordering    Standing Status:   Future    Standing Expiration Date:   11/30/2016    Order Specific Question:   Reason for exam:    Answer:   need port for chemo    Order Specific Question:   Is the patient pregnant?    Answer:   No    Order Specific Question:   [facility]    [facility] CBC with Differential/Platelet    Standing Status:   Future    Standing Expiration Date:   11/04/2016  . Comprehensive metabolic  panel    Standing Status:   Future    Standing Expiration Date:   11/04/2016  . Lactate dehydrogenase    Standing Status:   Future    Standing Expiration Date:   11/04/2016  . Hepatitis B core antibody, IgM    Standing Status:   Future    Standing Expiration Date:   11/04/2016  . Hepatitis B surface antibody    Standing Status:   Future    Standing Expiration Date:   11/04/2016  . Hepatitis B surface antigen    Standing Status:   Future    Standing Expiration Date:   11/04/2016  . Uric acid    Standing Status:   Future    Standing Expiration Date:   11/04/2016  . Flow Cytometry    CLL    Standing Status:   Future    Standing Expiration Date:   11/04/2016  . ECHOCARDIOGRAM COMPLETE    Standing Status:   Future    Standing Expiration Date:   12/30/2016    Order Specific Question:    Where should this test be performed    Answer:   [facility]    Order Specific Question:   Complete or Limited study?    Answer:   Limited    Order Specific Question:   With Image Enhancing Agent or without Image Enhancing Agent?    Answer:   Without Image Enhancing Agent    Order Specific Question:   Contraindication for Image Enhancing Agent?    Answer:   Per Appointment Conversion    Order Specific Question:   Reason for exam-Echo    Answer:   Chemo  V67.2 / Z09     All questions were answered. The patient knows to call the clinic with any problems, questions or concerns. I spent 60 minutes counseling the patient face to face. The total time spent in the appointment was 80 minutes and more than 50% was on counseling.     [facility], [place], MD 10/01/2015 2:54 PM

## 2015-10-01 NOTE — Telephone Encounter (Signed)
Per LOS I have scheduled appts and notified scheduler 

## 2015-10-02 ENCOUNTER — Telehealth: Payer: Self-pay | Admitting: *Deleted

## 2015-10-02 NOTE — Telephone Encounter (Signed)
1. PET Scan scheduled at [facility] 9/13 at 10:30 am.  NPO after Midnight (including no sugar, candy),  Arrive at [facility] Admitting desk in the [place] at 10 am.   2.  [facility] scheduled at [facility] IR on [place] 9/14 at 8:30 am.  NPO after MIdnight.  Arrive to [place] in [employer] at 7 am.  Need a driver home.  S/w Heather in IR at [facility].  They will draw labs ordered by Dr. Alvy Bimler for 9/15.   Lab appt here on 9/15 canceled.  3.  LVM for Echo to schedule same day as PAC

## 2015-10-02 NOTE — Telephone Encounter (Signed)
Echo scheduled for tomorrow at [place].  Pt to arrive to Admitting at 10:45 am.   Called pt and gave her information on all her appts dates/time/ locations and instructions.  Pt verbalized understanding.

## 2015-10-02 NOTE — Telephone Encounter (Signed)
Duplicate, opened note in error

## 2015-10-03 ENCOUNTER — Telehealth: Payer: Self-pay | Admitting: *Deleted

## 2015-10-03 ENCOUNTER — Ambulatory Visit (HOSPITAL_COMMUNITY)
Admission: RE | Admit: 2015-10-03 | Discharge: 2015-10-03 | Disposition: A | Discharge status: Home / Self Care | Payer: 59 | Source: Ambulatory Visit | Attending: Hematology and Oncology | Admitting: Hematology and Oncology

## 2015-10-03 DIAGNOSIS — C8331 Diffuse large B-cell lymphoma, lymph nodes of head, face, and neck: Secondary | ICD-10-CM | POA: Diagnosis not present

## 2015-10-03 DIAGNOSIS — J45909 Unspecified asthma, uncomplicated: Secondary | ICD-10-CM | POA: Diagnosis not present

## 2015-10-03 DIAGNOSIS — R011 Cardiac murmur, unspecified: Secondary | ICD-10-CM | POA: Insufficient documentation

## 2015-10-03 DIAGNOSIS — I517 Cardiomegaly: Secondary | ICD-10-CM | POA: Insufficient documentation

## 2015-10-03 DIAGNOSIS — Z09 Encounter for follow-up examination after completed treatment for conditions other than malignant neoplasm: Secondary | ICD-10-CM | POA: Diagnosis present

## 2015-10-03 NOTE — Progress Notes (Signed)
  Echocardiogram 2D Echocardiogram has been performed.  Darlina Sicilian M 10/03/2015, 11:34 AM

## 2015-10-03 NOTE — Telephone Encounter (Signed)
Spoke with [facility] IR. They will draw labs for Dr Alvy Bimler on 9/14 with port placement

## 2015-10-05 ENCOUNTER — Emergency Department (HOSPITAL_COMMUNITY)
Admission: EM | Admit: 2015-10-05 | Discharge: 2015-10-05 | Disposition: A | Discharge status: Home / Self Care | Payer: 59 | Attending: Emergency Medicine | Admitting: Emergency Medicine

## 2015-10-05 ENCOUNTER — Emergency Department (HOSPITAL_COMMUNITY): Payer: 59

## 2015-10-05 ENCOUNTER — Encounter (HOSPITAL_COMMUNITY): Payer: Self-pay | Admitting: Oncology

## 2015-10-05 DIAGNOSIS — Z91040 Latex allergy status: Secondary | ICD-10-CM | POA: Diagnosis not present

## 2015-10-05 DIAGNOSIS — Z8572 Personal history of non-Hodgkin lymphomas: Secondary | ICD-10-CM | POA: Insufficient documentation

## 2015-10-05 DIAGNOSIS — R0981 Nasal congestion: Secondary | ICD-10-CM | POA: Diagnosis present

## 2015-10-05 DIAGNOSIS — J069 Acute upper respiratory infection, unspecified: Secondary | ICD-10-CM | POA: Diagnosis not present

## 2015-10-05 HISTORY — DX: Non-Hodgkin lymphoma, unspecified, unspecified site: C85.90

## 2015-10-05 MED ORDER — PREDNISONE 20 MG PO TABS: 40.0000 mg | ORAL_TABLET | Freq: Every day | ORAL | 0 refills | Status: DC

## 2015-10-05 NOTE — ED Provider Notes (Signed)
[facility] DEPT Provider Note   CSN: [id number] Arrival date & time: 10/05/15  [id number]  By signing my name below, I, Georgette Shell, attest that this documentation has been prepared under the direction and in the presence of Orpah Greek, MD. Electronically Signed: Georgette Shell, ED Scribe. 10/05/15. 2:51 AM.  History   Chief Complaint Chief Complaint  Patient presents with  . Nasal Congestion   HPI Comments: ORNA LUSCH is a 55 y.o. female with h/o lymphoma who presents to the Emergency Department complaining of gradual onset, constant congestion onset approximately one hour ago. Pt also has associated sinus pressure and drainage, sore throat, and shortness of breath. Pt reports her symptoms woke her up out of her sleep. No alleviating factors noted. Pt denies cough, fever, or any other associated symptoms.   The history is provided by the patient. No language interpreter was used.    Past Medical History:  Diagnosis Date  . Lymphoma [facility])     Patient Active Problem List   Diagnosis Date Noted  . Diffuse large B-cell lymphoma of lymph nodes of neck ([place]) 10/01/2015  . Left breast abscess 02/11/2015    Past Surgical History:  Procedure Laterality Date  . BREAST SURGERY  01.17.2017   incise and drain right breast abscess  . CESAREAN SECTION  09.10.1996  . CHOLECYSTECTOMY    . INCISION AND DRAINAGE ABSCESS Right 02/11/2015   Procedure: INCISION AND DRAINAGE BREAST ABSCESS, RIGHT  BREAST BIOPSY;  Surgeon: Fanny Skates, MD;  Location: [facility] ORS;  Service: General;  Laterality: Right;  . TONSILLECTOMY      OB History    No data available       Home Medications    Prior to Admission medications   Medication Sig Start Date End Date Taking? Authorizing Provider  Ascorbic Acid (VITAMIN C PO) Take 1 tablet by mouth daily.    Historical Provider, MD  Cholecalciferol (VITAMIN D PO) Take 1 tablet by mouth daily.    Historical Provider, MD  Multiple Vitamins-Minerals  (MULTIVITAMIN WITH MINERALS) tablet Take 1 tablet by mouth daily.    Historical Provider, MD  predniSONE (DELTASONE) 20 MG tablet Take 2 tablets (40 mg total) by mouth daily with breakfast. 10/05/15   Orpah Greek, MD    Family History Family History  Problem Relation Age of Onset  . Cancer Father     ?throat ca  . Stroke Mother   . Hypertension Mother     Social History Social History  Substance Use Topics  . Smoking status: Never Smoker  . Smokeless tobacco: Never Used  . Alcohol use No     Allergies   Latex; Penicillins; and Sulfa antibiotics   Review of Systems Review of Systems  Constitutional: Negative for fever.  HENT: Positive for congestion and sore throat.   Respiratory: Positive for shortness of breath. Negative for cough.      Physical Exam Updated Vital Signs BP 155/87 (BP Location: Left Arm)   Pulse 77   Temp 98.8 F (37.1 C) (Oral)   Resp 16   Ht 5\' 3"  (1.6 m)   Wt 214 lb (97.1 kg)   SpO2 99%   BMI 37.91 kg/m   Physical Exam  Constitutional: She is oriented to person, place, and time. She appears well-developed and well-nourished. No distress.  HENT:  Head: Normocephalic and atraumatic.  Right Ear: Hearing normal.  Left Ear: Hearing normal.  Nose: Nose normal.  Mouth/Throat: Oropharynx is clear and moist and mucous membranes  are normal.  Right upper cervical lymphadenopathy.  Eyes: Conjunctivae and EOM are normal. Pupils are equal, round, and reactive to light.  Neck: Normal range of motion. Neck supple.  Cardiovascular: Regular rhythm, S1 normal and S2 normal.  Exam reveals no gallop and no friction rub.   No murmur heard. Pulmonary/Chest: Effort normal and breath sounds normal. No respiratory distress. She exhibits no tenderness.  Abdominal: Soft. Normal appearance and bowel sounds are normal. There is no hepatosplenomegaly. There is no tenderness. There is no rebound, no guarding, no tenderness at McBurney's point and negative  Murphy's sign. No hernia.  Musculoskeletal: Normal range of motion.  Neurological: She is alert and oriented to person, place, and time. She has normal strength. No cranial nerve deficit or sensory deficit. Coordination normal. GCS eye subscore is 4. GCS verbal subscore is 5. GCS motor subscore is 6.  Skin: Skin is warm, dry and intact. No rash noted. No cyanosis.  Psychiatric: She has a normal mood and affect. Her speech is normal and behavior is normal. Thought content normal.  Nursing note and vitals reviewed.    ED Treatments / Results  DIAGNOSTIC STUDIES: Oxygen Saturation is 99% on RA, normal by my interpretation.    COORDINATION OF CARE: 2:50 AM Discussed treatment plan with pt at bedside which includes x-ray and pt agreed to plan.  Labs (all labs ordered are listed, but only abnormal results are displayed) Labs Reviewed - No data to display  EKG  EKG Interpretation None       Radiology Dg Neck Soft Tissue  Result Date: 10/05/2015 CLINICAL DATA:  Acute onset of worsening congestion, sinus pressure and drainage, sore throat and shortness of breath. Initial encounter. EXAM: NECK SOFT TISSUES - 1+ VIEW COMPARISON:  CT of the soft tissues of the neck performed 09/30/2015 FINDINGS: The known mass at the base of the tongue is partially characterized, with narrowing of the hypopharynx. Prevertebral soft tissues are within normal limits. The proximal trachea is unremarkable. Mild multilevel disc space narrowing is noted along the cervical spine, with small anterior and posterior disc osteophyte complexes. The visualized paranasal sinuses and mastoid air cells are well-aerated. The visualized lung apices are grossly clear. IMPRESSION: The known mass at the base of the tongue is partially characterized, with narrowing of the hypopharynx. This was better assessed on recent CT of the neck, and remains suspicious for primary head and neck cancer. Underlying metastatic nodal disease is less  well seen on radiograph. Electronically Signed   By: Garald Balding M.D.   On: 10/05/2015 03:31    Procedures Procedures (including critical care time)  Medications Ordered in ED Medications - No data to display   Initial Impression / Assessment and Plan / ED Course  I have reviewed the triage vital signs and the nursing notes.  Pertinent labs & imaging results that were available during my care of the patient were reviewed by me and considered in my medical decision making (see chart for details).  Clinical Course   Patient presents to the emergency department with complaints of dry mouth, nasal congestion and soreness of the throat. Patient is concerned about the symptoms because she was recently diagnosed with lymphoma in her head and neck. Examination is unremarkable. No stridor. Oropharyngeal examination was clear, no swelling. Soft tissue neck does show the mass, but no significant airway impingement. No masses are palpated on the right side of the neck, but she reports that they have not enlarged at all. Will treat with  short course of prednisone, follow-up with primary care and oncology as outpatient.  Final Clinical Impressions(s) / ED Diagnoses   Final diagnoses:  URI (upper respiratory infection)    New Prescriptions New Prescriptions   PREDNISONE (DELTASONE) 20 MG TABLET    Take 2 tablets (40 mg total) by mouth daily with breakfast.  I personally performed the services described in this documentation, which was scribed in my presence. The recorded information has been reviewed and is accurate.     Orpah Greek, MD 10/05/15 [phone]

## 2015-10-05 NOTE — ED Triage Notes (Signed)
Pt recently dx w/ lymphoma.  Pt presents tonight d/t congestion, sinus pressure/drainage, cough and shob that started approximately 1 hour ago.  Pt can speak in full sentences w/o difficulty.  Pt is A&O x 4.

## 2015-10-07 ENCOUNTER — Other Ambulatory Visit (HOSPITAL_COMMUNITY): Payer: Self-pay | Admitting: Otolaryngology

## 2015-10-07 ENCOUNTER — Other Ambulatory Visit: Payer: Self-pay | Admitting: General Surgery

## 2015-10-07 ENCOUNTER — Other Ambulatory Visit: Payer: Self-pay | Admitting: Radiology

## 2015-10-07 ENCOUNTER — Other Ambulatory Visit: Payer: Self-pay | Admitting: Otolaryngology

## 2015-10-07 DIAGNOSIS — D490 Neoplasm of unspecified behavior of digestive system: Secondary | ICD-10-CM

## 2015-10-07 DIAGNOSIS — C8331 Diffuse large B-cell lymphoma, lymph nodes of head, face, and neck: Secondary | ICD-10-CM

## 2015-10-08 ENCOUNTER — Ambulatory Visit
Admission: RE | Admit: 2015-10-08 | Discharge: 2015-10-08 | Disposition: A | Discharge status: Home / Self Care | Payer: No Typology Code available for payment source | Source: Ambulatory Visit | Attending: Otolaryngology | Admitting: Otolaryngology

## 2015-10-08 ENCOUNTER — Ambulatory Visit: Admission: RE | Admit: 2015-10-08 | Payer: 59 | Source: Ambulatory Visit

## 2015-10-08 ENCOUNTER — Other Ambulatory Visit: Payer: Self-pay | Admitting: Radiology

## 2015-10-08 ENCOUNTER — Other Ambulatory Visit: Payer: Self-pay | Admitting: General Surgery

## 2015-10-08 DIAGNOSIS — R220 Localized swelling, mass and lump, head: Secondary | ICD-10-CM | POA: Insufficient documentation

## 2015-10-08 DIAGNOSIS — R590 Localized enlarged lymph nodes: Secondary | ICD-10-CM | POA: Diagnosis not present

## 2015-10-08 DIAGNOSIS — C8331 Diffuse large B-cell lymphoma, lymph nodes of head, face, and neck: Secondary | ICD-10-CM | POA: Diagnosis not present

## 2015-10-08 DIAGNOSIS — D3705 Neoplasm of uncertain behavior of pharynx: Secondary | ICD-10-CM | POA: Insufficient documentation

## 2015-10-08 DIAGNOSIS — D490 Neoplasm of unspecified behavior of digestive system: Secondary | ICD-10-CM

## 2015-10-08 LAB — GLUCOSE, CAPILLARY: GLUCOSE-CAPILLARY: 98 mg/dL (ref 65–99)

## 2015-10-08 MED ORDER — FLUDEOXYGLUCOSE F - 18 (FDG) INJECTION
12.6600 | Freq: Once | INTRAVENOUS | Status: AC | PRN
  Administered 2015-10-08: 12.66 via INTRAVENOUS

## 2015-10-09 ENCOUNTER — Other Ambulatory Visit: Payer: Self-pay | Admitting: Hematology and Oncology

## 2015-10-09 ENCOUNTER — Encounter (HOSPITAL_COMMUNITY): Payer: Self-pay

## 2015-10-09 ENCOUNTER — Telehealth: Payer: Self-pay | Admitting: *Deleted

## 2015-10-09 ENCOUNTER — Other Ambulatory Visit: Payer: Self-pay | Admitting: *Deleted

## 2015-10-09 ENCOUNTER — Ambulatory Visit (HOSPITAL_COMMUNITY)
Admission: RE | Admit: 2015-10-09 | Discharge: 2015-10-09 | Disposition: A | Discharge status: Home / Self Care | Payer: 59 | Source: Ambulatory Visit | Attending: Hematology and Oncology | Admitting: Hematology and Oncology

## 2015-10-09 DIAGNOSIS — Z882 Allergy status to sulfonamides status: Secondary | ICD-10-CM | POA: Diagnosis not present

## 2015-10-09 DIAGNOSIS — C8331 Diffuse large B-cell lymphoma, lymph nodes of head, face, and neck: Secondary | ICD-10-CM

## 2015-10-09 DIAGNOSIS — Z880 Allergy status to penicillin: Secondary | ICD-10-CM | POA: Insufficient documentation

## 2015-10-09 DIAGNOSIS — Z823 Family history of stroke: Secondary | ICD-10-CM | POA: Insufficient documentation

## 2015-10-09 DIAGNOSIS — C8330 Diffuse large B-cell lymphoma, unspecified site: Secondary | ICD-10-CM | POA: Diagnosis present

## 2015-10-09 DIAGNOSIS — Z91040 Latex allergy status: Secondary | ICD-10-CM | POA: Diagnosis not present

## 2015-10-09 DIAGNOSIS — Z8249 Family history of ischemic heart disease and other diseases of the circulatory system: Secondary | ICD-10-CM | POA: Diagnosis not present

## 2015-10-09 HISTORY — PX: IR GENERIC HISTORICAL: IMG1180011

## 2015-10-09 LAB — CBC WITH DIFFERENTIAL/PLATELET
BASOS ABS: 0 10*3/uL (ref 0.0–0.1)
BASOS PCT: 0 %
EOS PCT: 3 %
Eosinophils Absolute: 0.1 10*3/uL (ref 0.0–0.7)
HCT: 40.3 % (ref 36.0–46.0)
Hemoglobin: 13.1 g/dL (ref 12.0–15.0)
LYMPHS PCT: 37 %
Lymphs Abs: 1.7 10*3/uL (ref 0.7–4.0)
MCH: 28.3 pg (ref 26.0–34.0)
MCHC: 32.5 g/dL (ref 30.0–36.0)
MCV: 87 fL (ref 78.0–100.0)
MONO ABS: 0.5 10*3/uL (ref 0.1–1.0)
Monocytes Relative: 10 %
Neutro Abs: 2.3 10*3/uL (ref 1.7–7.7)
Neutrophils Relative %: 50 %
PLATELETS: 252 10*3/uL (ref 150–400)
RBC: 4.63 MIL/uL (ref 3.87–5.11)
RDW: 13.3 % (ref 11.5–15.5)
WBC: 4.5 10*3/uL (ref 4.0–10.5)

## 2015-10-09 LAB — COMPREHENSIVE METABOLIC PANEL
ALBUMIN: 3.7 g/dL (ref 3.5–5.0)
ALT: 25 U/L (ref 14–54)
ANION GAP: 10 (ref 5–15)
AST: 27 U/L (ref 15–41)
Alkaline Phosphatase: 61 U/L (ref 38–126)
BUN: 8 mg/dL (ref 6–20)
CALCIUM: 9.2 mg/dL (ref 8.9–10.3)
CHLORIDE: 103 mmol/L (ref 101–111)
CO2: 27 mmol/L (ref 22–32)
Creatinine, Ser: 0.76 mg/dL (ref 0.44–1.00)
GFR calc non Af Amer: >60 mL/min (ref >60)
GLUCOSE: 103 mg/dL — AB (ref 65–99)
POTASSIUM: 3.6 mmol/L (ref 3.5–5.1)
SODIUM: 140 mmol/L (ref 135–145)
Total Bilirubin: 0.8 mg/dL (ref 0.3–1.2)
Total Protein: 7.7 g/dL (ref 6.5–8.1)

## 2015-10-09 LAB — BASIC METABOLIC PANEL
ANION GAP: 8 (ref 5–15)
BUN: 9 mg/dL (ref 6–20)
CALCIUM: 9.3 mg/dL (ref 8.9–10.3)
CO2: 28 mmol/L (ref 22–32)
Chloride: 103 mmol/L (ref 101–111)
Creatinine, Ser: 0.73 mg/dL (ref 0.44–1.00)
GLUCOSE: 102 mg/dL — AB (ref 65–99)
POTASSIUM: 3.6 mmol/L (ref 3.5–5.1)
Sodium: 139 mmol/L (ref 135–145)

## 2015-10-09 LAB — PROTIME-INR
INR: 1.09
PROTHROMBIN TIME: 14.1 s (ref 11.4–15.2)

## 2015-10-09 LAB — LACTATE DEHYDROGENASE: LDH: 205 U/L — ABNORMAL HIGH (ref 98–192)

## 2015-10-09 LAB — APTT: APTT: 29 s (ref 24–36)

## 2015-10-09 LAB — URIC ACID: URIC ACID, SERUM: 5.4 mg/dL (ref 2.3–6.6)

## 2015-10-09 MED ORDER — HEPARIN SOD (PORK) LOCK FLUSH 100 UNIT/ML IV SOLN
INTRAVENOUS | Status: AC
  Administered 2015-10-09: 500 [IU]
  Filled 2015-10-09: qty 5

## 2015-10-09 MED ORDER — FENTANYL CITRATE (PF) 100 MCG/2ML IJ SOLN
INTRAMUSCULAR | Status: AC
  Filled 2015-10-09: qty 2

## 2015-10-09 MED ORDER — FENTANYL CITRATE (PF) 100 MCG/2ML IJ SOLN
INTRAMUSCULAR | Status: AC | PRN
  Administered 2015-10-09: 100 ug via INTRAVENOUS

## 2015-10-09 MED ORDER — IOPAMIDOL (ISOVUE-300) INJECTION 61%
INTRAVENOUS | Status: AC
  Administered 2015-10-09: 25 mL
  Filled 2015-10-09: qty 50

## 2015-10-09 MED ORDER — MIDAZOLAM HCL 2 MG/2ML IJ SOLN
INTRAMUSCULAR | Status: AC | PRN
  Administered 2015-10-09: 2 mg via INTRAVENOUS

## 2015-10-09 MED ORDER — VANCOMYCIN HCL IN DEXTROSE 1-5 GM/200ML-% IV SOLN
1000.0000 mg | INTRAVENOUS | Status: AC
  Administered 2015-10-09: 1000 mg via INTRAVENOUS

## 2015-10-09 MED ORDER — MIDAZOLAM HCL 2 MG/2ML IJ SOLN
INTRAMUSCULAR | Status: AC
  Filled 2015-10-09: qty 2

## 2015-10-09 MED ORDER — LIDOCAINE-EPINEPHRINE (PF) 1 %-1:200000 IJ SOLN
INTRAMUSCULAR | Status: AC
  Administered 2015-10-09: 10 mL
  Filled 2015-10-09: qty 30

## 2015-10-09 MED ORDER — SODIUM CHLORIDE 0.9 % IV SOLN: INTRAVENOUS | Status: DC

## 2015-10-09 MED ORDER — VANCOMYCIN HCL IN DEXTROSE 1-5 GM/200ML-% IV SOLN
INTRAVENOUS | Status: AC
  Filled 2015-10-09: qty 200

## 2015-10-09 NOTE — Sedation Documentation (Signed)
Patient is resting comfortably. 

## 2015-10-09 NOTE — Telephone Encounter (Signed)
Call from [facility] with [facility] asking how to complete Flow Ctytometyry.  Call transferred to ext [phone].

## 2015-10-09 NOTE — H&P (Signed)
Chief Complaint: Patient was seen in consultation today for [facility] a Cath placement at the request of [place]  Referring Physician(s): Heath Lark  Supervising Physician: Sandi Mariscal  Patient Status: Outpatient  History of Present Illness: Charlotte Davidson is a 55 y.o. female   New diagnosis Lymphoma Scheduled for [facility] placement per Dr Alvy Bimler   Past Medical History:  Diagnosis Date  . Lymphoma [facility])     Past Surgical History:  Procedure Laterality Date  . BREAST SURGERY  01.17.2017   incise and drain right breast abscess  . CESAREAN SECTION  09.10.1996  . CHOLECYSTECTOMY    . INCISION AND DRAINAGE ABSCESS Right 02/11/2015   Procedure: INCISION AND DRAINAGE BREAST ABSCESS, RIGHT  BREAST BIOPSY;  Surgeon: Fanny Skates, MD;  Location: [facility];  Service: General;  Laterality: Right;  . TONSILLECTOMY      Allergies: Latex; Penicillins; and Sulfa antibiotics  Medications: Prior to Admission medications   Medication Sig Start Date End Date Taking? Authorizing Provider  Ascorbic Acid (VITAMIN C PO) Take 1 tablet by mouth daily.   Yes Historical Provider, MD  Cholecalciferol (VITAMIN D PO) Take 1 tablet by mouth daily.   Yes Historical Provider, MD  Multiple Vitamins-Minerals (MULTIVITAMIN WITH MINERALS) tablet Take 1 tablet by mouth daily.   Yes Historical Provider, MD  naproxen sodium (ANAPROX) 220 MG tablet Take 220 mg by mouth 2 (two) times daily as needed (for pain).   Yes Historical Provider, MD  predniSONE (DELTASONE) 20 MG tablet Take 2 tablets (40 mg total) by mouth daily with breakfast. Patient not taking: Reported on 10/06/2015 10/05/15   Orpah Greek, MD     Family History  Problem Relation Age of Onset  . Cancer Father     ?throat ca  . Stroke Mother   . Hypertension Mother     Social History   Social History  . Marital status: Married    Spouse name: Edwards  . Number of children: 2  . Years of education: N/A   Occupational History  .  Tour manager    Social History Main Topics  . Smoking status: Never Smoker  . Smokeless tobacco: Never Used  . Alcohol use No  . Drug use: No  . Sexual activity: Not Asked   Other Topics Concern  . None   Social History Narrative  . None    Review of Systems: A 12 point ROS discussed and pertinent positives are indicated in the HPI above.  All other systems are negative.  Review of Systems  Constitutional: Negative for activity change, appetite change, fatigue and fever.  Respiratory: Negative for cough and shortness of breath.   Cardiovascular: Negative for chest pain.  Gastrointestinal: Negative for abdominal pain.  Musculoskeletal: Negative for back pain.  Psychiatric/Behavioral: Negative for behavioral problems and confusion.    Vital Signs: BP 140/84   Pulse 80   Temp 97.7 F (36.5 C) (Oral)   Resp 18   Ht 5\' 4"  (1.626 m)   Wt 214 lb (97.1 kg)   SpO2 99%   BMI 36.73 kg/m   Physical Exam  Constitutional: She is oriented to person, place, and time.  Cardiovascular: Normal rate, regular rhythm and normal heart sounds.   Pulmonary/Chest: Effort normal and breath sounds normal. No respiratory distress.  Abdominal: Soft. Bowel sounds are normal. There is no tenderness.  Musculoskeletal: Normal range of motion.  Neurological: She is alert and oriented to person, place, and time.  Skin: Skin is warm and  dry.  Psychiatric: She has a normal mood and affect. Her behavior is normal. Judgment and thought content normal.  Nursing note and vitals reviewed.   Mallampati Score:  MD Evaluation Airway: WNL Heart: WNL Abdomen: WNL Chest/ Lungs: WNL ASA  Classification: 3 Mallampati/Airway Score: One  Imaging: Dg Neck Soft Tissue  Result Date: 10/05/2015 CLINICAL DATA:  Acute onset of worsening congestion, sinus pressure and drainage, sore throat and shortness of breath. Initial encounter. EXAM: NECK SOFT TISSUES - 1+ VIEW COMPARISON:  CT of the soft tissues of the  neck performed 09/30/2015 FINDINGS: The known mass at the base of the tongue is partially characterized, with narrowing of the hypopharynx. Prevertebral soft tissues are within normal limits. The proximal trachea is unremarkable. Mild multilevel disc space narrowing is noted along the cervical spine, with small anterior and posterior disc osteophyte complexes. The visualized paranasal sinuses and mastoid air cells are well-aerated. The visualized lung apices are grossly clear. IMPRESSION: The known mass at the base of the tongue is partially characterized, with narrowing of the hypopharynx. This was better assessed on recent CT of the neck, and remains suspicious for primary head and neck cancer. Underlying metastatic nodal disease is less well seen on radiograph. Electronically Signed   By: Garald Balding M.D.   On: 10/05/2015 03:31   Ct Soft Tissue Neck W Contrast  Result Date: 09/30/2015 CLINICAL DATA:  RIGHT submandibular swelling for 2 weeks, sinonasal mass seen on endoscopy. Evaluate cervical lymphadenopathy. EXAM: CT NECK WITH CONTRAST TECHNIQUE: Multidetector CT imaging of the neck was performed using the standard protocol following the bolus administration of intravenous contrast. CONTRAST:  11mL ISOVUE-300 IOPAMIDOL (ISOVUE-300) INJECTION 61% COMPARISON:  None. FINDINGS: Pharynx and larynx: Enlarged adenoidal soft tissues. LEFT palatine tonsillar submucosal isodense mass, approximate 21 x 19 x 28 mm (volume = 5800 mm^3). There is partial effacement of the airway, which remains patent. 8 mm hypodense mass RIGHT base of tongue (axial 45/96). Normal appearance of the larynx. Salivary glands: Normal. Thyroid: Normal. Lymph nodes: Bulky predominantly RIGHT-sided lymphadenopathy, including RIGHT level IIa 28 x 29 mm nodal conglomeration and 22 x 30 mm RIGHT level IIb nodal conglomeration with necrosis. 11 mm short axis RIGHT lateral pharyngeal lymph node. Smaller though pathologically enlarged RIGHT level 3  lymph nodes. Vascular: Mass effect and partial effacement of the RIGHT internal jugular vein which remains patent. Medial displacement of the RIGHT carotid artery due to mass effect. Limited intracranial: Normal. Visualized orbits: Normal. Mastoids and visualized paranasal sinuses: LEFT middle ear in mastoid effusion. Mucosal thickening bilateral sphenoid sinuses. Skeleton: Moderate C3-4 thru C5-6 degenerative discs. No destructive bony lesions. Upper chest: Lung apices are clear. No superior mediastinal lymphadenopathy. IMPRESSION: Bulky RIGHT neck lymphadenopathy concerning for metastatic disease. 8 mm submucosal RIGHT base of tongue mass, considering ipsilateral lymphadenopathy, this is suspicious for primary head and neck cancer. Isodense LEFT palatine tonsillar mass versus focal lymphoid hyperplasia resulting in LEFT postobstructive middle ear/mastoid effusion. These results will be called to the ordering clinician or representative by the Radiologist Assistant, and communication documented in the PACS or zVision Dashboard. Electronically Signed   By: Elon Alas M.D.   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 EXAM: NUCLEAR MEDICINE PET SKULL BASE TO THIGH TECHNIQUE: 12.7 mCi F-18 FDG was injected intravenously. Full-ring PET imaging was performed from the skull base to thigh after the radiotracer.  CT data was obtained and used for attenuation correction and anatomic localization. FASTING BLOOD GLUCOSE:  Value: 98 mg/dl COMPARISON:  CT neck 09/30/2015. FINDINGS: NECK Nasopharyngeal hypermetabolism corresponds to a predominantly left-sided tonsillar mass, better measured on 09/30/2015, with an SUV max of 20.6. Hypermetabolic soft tissue at the base of the tongue has an SUV max of 14.5 and is difficult to measure on noncontrast imaging. Hypermetabolic adenopathy is seen in  the neck bilaterally, predominantly within the level 2 stations. Index right level 2 lymph node measures 2.2 cm (CT image 26) with an SUV max of 16.5. Left level-II lymph nodes measure up to 1.3 cm (CT image 30) with an SUV max of 13.2. Right neck adenopathy extends inferiorly into the low right internal jugular station. CHEST No hypermetabolic mediastinal, hilar or axillary lymph nodes. CT images show no pericardial or pleural effusion. 3 mm left lower lobe nodule (series 3, image 95), too small for PET resolution. ABDOMEN/PELVIS No abnormal hypermetabolism in the liver, adrenal glands, spleen or pancreas. No hypermetabolic lymph nodes. Liver is grossly unremarkable. Cholecystectomy. Adrenal glands, kidneys, spleen, pancreas, stomach and bowel are grossly unremarkable. SKELETON No abnormal osseous hypermetabolism. IMPRESSION: Hypermetabolic left nasopharyngeal and tongue base masses with hypermetabolic bilateral neck adenopathy. Right level 2 adenopathy extends inferiorly into the low right internal jugular station. No additional evidence of metastatic disease in the chest, abdomen or pelvis. Electronically Signed   By: Lorin Picket M.D.   On: 10/08/2015 13:40    Labs:  CBC:  Recent Labs  02/11/15 0750  WBC 7.3  HGB 12.5  HCT 38.6  PLT 291    COAGS: No results for input(s): INR, APTT in the last 8760 hours.  BMP: No results for input(s): NA, K, CL, CO2, GLUCOSE, BUN, CALCIUM, CREATININE, GFRNONAA, GFRAA in the last 8760 hours.  Invalid input(s): CMP  LIVER FUNCTION TESTS: No results for input(s): BILITOT, AST, ALT, ALKPHOS, PROT, ALBUMIN in the last 8760 hours.  TUMOR MARKERS: No results for input(s): AFPTM, CEA, CA199, CHROMGRNA in the last 8760 hours.  Assessment and Plan:  Lymphoma Port a cath placement today for chemotherapy Risks and Benefits discussed with the patient including, but not limited to bleeding, infection, pneumothorax, or fibrin sheath development and need for  additional procedures. All of the patient's questions were answered, patient is agreeable to proceed. Consent signed and in chart.   Thank you for this interesting consult.  I greatly enjoyed meeting Charlotte Davidson and look forward to participating in their care.  A copy of this report was sent to the requesting provider on this date.  Electronically Signed: Casi Westerfeld A 10/09/2015, 7:51 AM   I spent a total of  30 Minutes   in face to face in clinical consultation, greater than 50% of which was counseling/coordinating care for [facility] placement

## 2015-10-09 NOTE — Discharge Instructions (Signed)
Return to Work ___[employer]_______________________________________________ was treated at our facility. INJURY OR ILLNESS WAS: _____ Work related ___xx__ Not work related _____ Undetermined if work related RETURN TO WORK  Employee may return to work on ____9/17/17________________.  Employee may return to modified work on ____________________. WORK ACTIVITY RESTRICTIONS Work activities that are not tolerated include: _____ Bending _____ Prolonged sitting _____ Lifting more than ____________________ lb _____ Squatting _____ Prolonged standing _____ Climbing _____ Reaching _____ Pushing and pulling _____ Walking _____ Other ____________________ These restrictions are effective until ____________________. Show this Return to Work statement to your supervisor at work as soon as possible. Your employer should be aware of your condition and can help with the necessary work activity restrictions. If you wish to return to work sooner than the date that is listed above, or if you have further problems that make it difficult for you to return at that time, please call our clinic or your health care provider. ___jodette Marta Antu ______________________________________ Health Care Provider Name (printed) ___Dr Pascal Lux ______________________________________ Health Care Provider (signature)  ___9/14/17______________________________________ Date   This information is not intended to replace advice given to you by your health care provider. Make sure you discuss any questions you have with your health care provider.   Document Released: 01/11/2005 Document Revised: 02/01/2014 Document Reviewed: 08/10/2013 Elsevier Interactive Patient Education 2016 [place] An implanted port is a type of central line that is placed under the skin. Central lines are used to provide IV access when treatment or nutrition needs to be given through a person's veins. Implanted ports  are used for long-term IV access. An implanted port may be placed because:   You need IV medicine that would be irritating to the small veins in your hands or arms.   You need long-term IV medicines, such as antibiotics.   You need IV nutrition for a long period.   You need frequent blood draws for lab tests.   You need dialysis.  Implanted ports are usually placed in the chest area, but they can also be placed in the upper arm, the abdomen, or the leg. An implanted port has two main parts:   Reservoir. The reservoir is round and will appear as a small, raised area under your skin. The reservoir is the part where a needle is inserted to give medicines or draw blood.   Catheter. The catheter is a thin, flexible tube that extends from the reservoir. The catheter is placed into a large vein. Medicine that is inserted into the reservoir goes into the catheter and then into the vein.  HOW WILL I CARE FOR MY INCISION SITE? Do not get the incision site wet. Bathe or shower as directed by your health care provider.  HOW IS MY PORT ACCESSED? Special steps must be taken to access the port:   Before the port is accessed, a numbing cream can be placed on the skin. This helps numb the skin over the port site.   Your health care provider uses a sterile technique to access the port.  Your health care provider must put on a mask and sterile gloves.  The skin over your port is cleaned carefully with an antiseptic and allowed to dry.  The port is gently pinched between sterile gloves, and a needle is inserted into the port.  Only "non-coring" port needles should be used to access the port. Once the port is accessed, a blood return should be checked. This helps ensure that the  port is in the vein and is not clogged.   If your port needs to remain accessed for a constant infusion, a clear (transparent) bandage will be placed over the needle site. The bandage and needle will need to be changed  every week, or as directed by your health care provider.   Keep the bandage covering the needle clean and dry. Do not get it wet. Follow your health care provider's instructions on how to take a shower or bath while the port is accessed.   If your port does not need to stay accessed, no bandage is needed over the port.  WHAT IS FLUSHING? Flushing helps keep the port from getting clogged. Follow your health care provider's instructions on how and when to flush the port. Ports are usually flushed with saline solution or a medicine called heparin. The need for flushing will depend on how the port is used.   If the port is used for intermittent medicines or blood draws, the port will need to be flushed:   After medicines have been given.   After blood has been drawn.   As part of routine maintenance.   If a constant infusion is running, the port may not need to be flushed.  HOW LONG WILL MY PORT STAY IMPLANTED? The port can stay in for as long as your health care provider thinks it is needed. When it is time for the port to come out, surgery will be done to remove it. The procedure is similar to the one performed when the port was put in.  WHEN SHOULD I SEEK IMMEDIATE MEDICAL CARE? When you have an implanted port, you should seek immediate medical care if:   You notice a bad smell coming from the incision site.   You have swelling, redness, or drainage at the incision site.   You have more swelling or pain at the port site or the surrounding area.   You have a fever that is not controlled with medicine.   This information is not intended to replace advice given to you by your health care provider. Make sure you discuss any questions you have with your health care provider.   Document Released: 01/11/2005 Document Revised: 11/01/2012 Document Reviewed: 09/18/2012 Elsevier Interactive Patient Education [employer].

## 2015-10-09 NOTE — Procedures (Signed)
Successful placement of left IJ approach port-a-cath with tip at the superior caval atrial junction. The catheter is ready for immediate use. Estimated Blood Loss: Minimal No immediate post procedural complications.  Jay Noriah Osgood, MD Pager #: [phone]   

## 2015-10-10 ENCOUNTER — Telehealth: Payer: Self-pay | Admitting: Hematology and Oncology

## 2015-10-10 ENCOUNTER — Encounter: Payer: Self-pay | Admitting: Hematology and Oncology

## 2015-10-10 ENCOUNTER — Other Ambulatory Visit: Payer: 59

## 2015-10-10 ENCOUNTER — Ambulatory Visit (HOSPITAL_BASED_OUTPATIENT_CLINIC_OR_DEPARTMENT_OTHER): Payer: 59 | Admitting: Hematology and Oncology

## 2015-10-10 VITALS — BP 139/86 | HR 86 | Temp 98.2°F | Resp 18 | Ht 64.0 in | Wt 212.6 lb

## 2015-10-10 DIAGNOSIS — C8331 Diffuse large B-cell lymphoma, lymph nodes of head, face, and neck: Secondary | ICD-10-CM

## 2015-10-10 DIAGNOSIS — Z789 Other specified health status: Secondary | ICD-10-CM | POA: Insufficient documentation

## 2015-10-10 LAB — HEPATITIS B SURFACE ANTIBODY,QUALITATIVE: HEP B S AB: NONREACTIVE

## 2015-10-10 LAB — HEPATITIS B CORE ANTIBODY, IGM: HEP B C IGM: NEGATIVE

## 2015-10-10 LAB — HEPATITIS B SURFACE ANTIGEN: Hepatitis B Surface Ag: NEGATIVE

## 2015-10-10 MED ORDER — PREDNISONE 20 MG PO TABS: 60.0000 mg | ORAL_TABLET | Freq: Every day | ORAL | 0 refills | Status: DC

## 2015-10-10 MED ORDER — LIDOCAINE-PRILOCAINE 2.5-2.5 % EX CREA: TOPICAL_CREAM | CUTANEOUS | 3 refills | Status: DC

## 2015-10-10 MED ORDER — ONDANSETRON HCL 8 MG PO TABS: 8.0000 mg | ORAL_TABLET | Freq: Three times a day (TID) | ORAL | 1 refills | Status: DC | PRN

## 2015-10-10 MED ORDER — PROCHLORPERAZINE MALEATE 10 MG PO TABS: 10.0000 mg | ORAL_TABLET | Freq: Four times a day (QID) | ORAL | 6 refills | Status: DC | PRN

## 2015-10-10 MED ORDER — ALLOPURINOL 300 MG PO TABS: 300.0000 mg | ORAL_TABLET | Freq: Every day | ORAL | 0 refills | Status: DC

## 2015-10-10 MED FILL — LIDOCAINE-PRILOCAINE CREAM: 2.5-2.5 | 10 days supply | Qty: 30 | Fill #0

## 2015-10-10 MED FILL — predniSONE 20 MG TABS: 20 | 12 days supply | Qty: 36 | Fill #0

## 2015-10-10 MED FILL — ALLOPURINOL 300 MG TABLET: 300 | 30 days supply | Qty: 30 | Fill #0

## 2015-10-10 MED FILL — ONDANSETRON HCL 8 MG TABLET: 8 | 10 days supply | Qty: 30 | Fill #0

## 2015-10-10 MED FILL — PROCHLORPERAZINE 10 MG TAB: 10 | 7 days supply | Qty: 30 | Fill #0

## 2015-10-10 NOTE — Telephone Encounter (Signed)
Gave patient avs report and appointments for September.  °

## 2015-10-10 NOTE — Progress Notes (Signed)
Called pt on 10/10/15 to introduce myself as her FA and to talk about copay assistance.  We discussed the [employer], LLS and the [employer] program.  Pt gave me consent to apply in her behalf. She is approved for $25,000 for Rituxan from 10/10/15 to 10/08/16.  I emailed a copy of the approval letter to [place] in billing & gave a copy to HIM to scan in pt's chart. 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  Pt will also be enrolled in the [employer] program on her next visit.

## 2015-10-10 NOTE — Progress Notes (Signed)
[facility] OFFICE PROGRESS NOTE  Patient Care Team: Haywood Pao, MD as PCP - General (Internal Medicine) Heath Lark, MD as Consulting Physician (Hematology and Oncology) Jodi Marble, MD as Consulting Physician (Otolaryngology)  SUMMARY OF ONCOLOGIC HISTORY:   Diffuse large B-cell lymphoma of lymph nodes of neck ([place])   09/24/2015 Pathology Results    Final Cytologic Interpretation [id number]: Right level II neck mass, Fine Needle Aspiration I (smears and Thinprep): Malignant cells present, most consistent with large cell lymphoma.       09/24/2015 Pathology Results    [phone] The nasopharyngeal biopsy shows lymphoid tissue with diffuse effacement of the normal nodal architecture by large cells with vesicular nuclei, prominent nucleoli, and scant cytoplasm. The large cells are positive for CD20, BCL-6, BCL-2, CD5, and MUM1 by immunohistochemistry. They are negative for CD30, CD3, CD10, cyclinD1 and SOX11. In situ hybridization for EBV mRNA is negative. Ki-67 reveals a proliferation rate of approximately 60%. By flow cytometry, the atypical cells express CD19, CD5, and CD23 (see below). The expression of CD5 and CD23 by flow cytometry is suggestive of small lymphocytic lymphoma/ chronic lymphocytic leukemia (SLL/CLL) transforming to a diffuse large B-cell lymphoma (DLBCL); however, there was no evidence of SLL/CLL in the flow sample. Correlation with peripheral blood/ marrow findings would be useful to determine if this lymphoma arose from background SLL/CLL or is a de novo CD5+ DLBCL. De novo CD5+ DLBCL is frequently associated with an aggressive clinical course and poor response to chemotherapy. A pleomorphic mantle cell lymphoma is less likely since the cells are negative for Cyclin D1 and SOX11. FISH studies are pending and will be reported as an addendum.       09/30/2015 Imaging    CT scan of neck showed bulky RIGHT neck lymphadenopathy concerning for metastatic  disease. 8 mm submucosal RIGHT base of tongue mass, considering ipsilateral lymphadenopathy, this is suspicious for primary head and neck cancer. Isodense LEFT palatine tonsillar mass versus focal lymphoid hyperplasia resulting in LEFT postobstructive middle ear/mastoid effusion.       10/03/2015 Imaging    ECHO showed normal EF      10/08/2015 PET scan    Hypermetabolic left nasopharyngeal and tongue base masses with hypermetabolic bilateral neck adenopathy. Right level 2 adenopathy extends inferiorly into the low right internal jugular station. No additional evidence of metastatic disease in the chest, abdomen or pelvis.      10/09/2015 Procedure    Successful placement of a left internal jugular approach power injectable Port-A-Cath. The catheter is ready for immediate use.       INTERVAL HISTORY: Please see below for problem oriented charting. She returns today with her husband to discuss plan of care. She feels well. Denies pain. REVIEW OF SYSTEMS:   Constitutional: Denies fevers, chills or abnormal weight loss Eyes: Denies blurriness of vision Ears, nose, mouth, throat, and face: Denies mucositis or sore throat Respiratory: Denies cough, dyspnea or wheezes Cardiovascular: Denies palpitation, chest discomfort or lower extremity swelling Gastrointestinal:  Denies nausea, heartburn or change in bowel habits Skin: Denies abnormal skin rashes Neurological:Denies numbness, tingling or new weaknesses Behavioral/Psych: Mood is stable, no new changes  All other systems were reviewed with the patient and are negative.  I have reviewed the past medical history, past surgical history, social history and family history with the patient and they are unchanged from previous note.  ALLERGIES:  is allergic to latex; penicillins; and sulfa antibiotics.  MEDICATIONS:  Current Outpatient Prescriptions  Medication Sig  Dispense Refill  . allopurinol (ZYLOPRIM) 300 MG tablet Take 1 tablet (300  mg total) by mouth daily. 30 tablet 0  . Ascorbic Acid (VITAMIN C PO) Take 1 tablet by mouth daily.    . Cholecalciferol (VITAMIN D PO) Take 1 tablet by mouth daily.    Marland Kitchen lidocaine-prilocaine (EMLA) cream Apply to affected area once 30 g 3  . Multiple Vitamins-Minerals (MULTIVITAMIN WITH MINERALS) tablet Take 1 tablet by mouth daily.    . naproxen sodium (ANAPROX) 220 MG tablet Take 220 mg by mouth 2 (two) times daily as needed (for pain).    . ondansetron (ZOFRAN) 8 MG tablet Take 1 tablet (8 mg total) by mouth every 8 (eight) hours as needed for refractory nausea / vomiting. 30 tablet 1  . predniSONE (DELTASONE) 20 MG tablet Take 3 tablets (60 mg total) by mouth daily. Take on days 2-5 of chemotherapy. 36 tablet 0  . prochlorperazine (COMPAZINE) 10 MG tablet Take 1 tablet (10 mg total) by mouth every 6 (six) hours as needed (Nausea or vomiting). 30 tablet 6   No current facility-administered medications for this visit.     PHYSICAL EXAMINATION: ECOG PERFORMANCE STATUS: 0 - Asymptomatic  Vitals:   10/10/15 1258  BP: 139/86  Pulse: 86  Resp: 18  Temp: 98.2 F (36.8 C)   Filed Weights   10/10/15 1258  Weight: 212 lb 9.6 oz (96.4 kg)    GENERAL:alert, no distress and comfortable SKIN: skin color, texture, turgor are normal, no rashes or significant lesions EYES: normal, Conjunctiva are pink and non-injected, sclera clear OROPHARYNX:no exudate, no erythema and lips, buccal mucosa, and tongue normal  NECK: supple, thyroid normal size, non-tender, without nodularity LYMPH:  She has visible lymphadenopathy on her neck NEURO: alert & oriented x 3 with fluent speech, no focal motor/sensory deficits  LABORATORY DATA:  I have reviewed the data as listed    Component Value Date/Time   NA 140 10/09/2015 0900   K 3.6 10/09/2015 0900   CL 103 10/09/2015 0900   CO2 27 10/09/2015 0900   GLUCOSE 103 (H) 10/09/2015 0900   BUN 8 10/09/2015 0900   CREATININE 0.76 10/09/2015 0900   CALCIUM  9.2 10/09/2015 0900   PROT 7.7 10/09/2015 0900   ALBUMIN 3.7 10/09/2015 0900   AST 27 10/09/2015 0900   ALT 25 10/09/2015 0900   ALKPHOS 61 10/09/2015 0900   BILITOT 0.8 10/09/2015 0900   GFRNONAA >60 10/09/2015 0900   GFRAA >60 10/09/2015 0900    No results found for: SPEP, UPEP  Lab Results  Component Value Date   WBC 4.5 10/09/2015   NEUTROABS 2.3 10/09/2015   HGB 13.1 10/09/2015   HCT 40.3 10/09/2015   MCV 87.0 10/09/2015   PLT 252 10/09/2015      Chemistry      Component Value Date/Time   NA 140 10/09/2015 0900   K 3.6 10/09/2015 0900   CL 103 10/09/2015 0900   CO2 27 10/09/2015 0900   BUN 8 10/09/2015 0900   CREATININE 0.76 10/09/2015 0900      Component Value Date/Time   CALCIUM 9.2 10/09/2015 0900   ALKPHOS 61 10/09/2015 0900   AST 27 10/09/2015 0900   ALT 25 10/09/2015 0900   BILITOT 0.8 10/09/2015 0900       RADIOGRAPHIC STUDIES:I reviewed PET CT scan with her and her husband I have personally reviewed the radiological images as listed and agreed with the findings in the report. Ir US Guide Vasc  Access Left  Result Date: 10/09/2015 INDICATION: History of lymphoma. In need of durable intravenous access for chemotherapy administration. EXAM: IMPLANTED PORT A CATH PLACEMENT WITH ULTRASOUND AND FLUOROSCOPIC GUIDANCE COMPARISON:  PET-CT - 10/08/2015 MEDICATIONS: Vancomycin 1 gm IV; The antibiotic was administered within an appropriate time interval prior to skin puncture. ANESTHESIA/SEDATION: Moderate (conscious) sedation was employed during this procedure. A total of Versed 2 mg and Fentanyl 100 mcg was administered intravenously. Moderate Sedation Time: 45 minutes. The patient's level of consciousness and vital signs were monitored continuously by radiology nursing throughout the procedure under my direct supervision. CONTRAST:  10 cc Isovue-300 FLUOROSCOPY TIME:  2 minutes, 30 seconds (58 mGy) COMPLICATIONS: None immediate. PROCEDURE: The procedure, risks,  benefits, and alternatives were explained to the patient. Questions regarding the procedure were encouraged and answered. The patient understands and consents to the procedure. Given the presence of the patient's known bulky hypermetabolic right cervical lymphadenopathy, the decision was made to place a left internal jugular approach port a catheter. The left neck and chest were prepped with chlorhexidine in a sterile fashion, and a sterile drape was applied covering the operative field. Maximum barrier sterile technique with sterile gowns and gloves were used for the procedure. A timeout was performed prior to the initiation of the procedure. Local anesthesia was provided with 1% lidocaine with epinephrine. After creating a small venotomy incision, a micropuncture kit was utilized to access the internal jugular vein under direct, real-time ultrasound guidance. Ultrasound image documentation was performed. As there was difficulty advancing the micro wire centrally, a limited venogram was performed with via the inner 3 French sheath of the micro puncture kit demonstrating tortuosity but patency of the central aspect the left internal jugular vein. Ultimately, the micro wire was advanced to the level of the superior cavoatrial junction. The microwire was kinked to measure appropriate catheter length. A subcutaneous port pocket was then created along the upper chest wall utilizing a combination of sharp and blunt dissection. The pocket was irrigated with sterile saline. A single lumen ISP power injectable port was chosen for placement. The 8 Fr catheter was tunneled from the port pocket site to the venotomy incision. The port was placed in the pocket. The external catheter was trimmed to appropriate length. At the venotomy, an 8 Fr peel-away sheath was placed over a guidewire under fluoroscopic guidance. The catheter was then placed through the sheath and the sheath was removed. Final catheter positioning was confirmed  and documented with a fluoroscopic spot radiograph. The port was accessed with a Huber needle, aspirated and flushed with heparinized saline. The venotomy site was closed with an interrupted 4-0 Vicryl suture. The port pocket incision was closed with interrupted 2-0 Vicryl suture and the skin was opposed with a running subcuticular 4-0 Vicryl suture. Dermabond and Steri-strips were applied to both incisions. Dressings were placed. The patient tolerated the procedure well without immediate post procedural complication. FINDINGS: After catheter placement, the tip lies within the superior cavoatrial junction. The catheter aspirates and flushes normally and is ready for immediate use. IMPRESSION: Successful placement of a left internal jugular approach power injectable Port-A-Cath. The catheter is ready for immediate use. Electronically Signed   By: Sandi Mariscal M.D.   On: 10/09/2015 10:57   Ir Fluoro Guide Port Insertion Left  Result Date: 10/09/2015 INDICATION: History of lymphoma. In need of durable intravenous access for chemotherapy administration. EXAM: IMPLANTED PORT A CATH PLACEMENT WITH ULTRASOUND AND FLUOROSCOPIC GUIDANCE COMPARISON:  PET-CT - 10/08/2015 MEDICATIONS: Vancomycin 1  gm IV; The antibiotic was administered within an appropriate time interval prior to skin puncture. ANESTHESIA/SEDATION: Moderate (conscious) sedation was employed during this procedure. A total of Versed 2 mg and Fentanyl 100 mcg was administered intravenously. Moderate Sedation Time: 45 minutes. The patient's level of consciousness and vital signs were monitored continuously by radiology nursing throughout the procedure under my direct supervision. CONTRAST:  10 cc Isovue-300 FLUOROSCOPY TIME:  2 minutes, 30 seconds (58 mGy) COMPLICATIONS: None immediate. PROCEDURE: The procedure, risks, benefits, and alternatives were explained to the patient. Questions regarding the procedure were encouraged and answered. The patient understands  and consents to the procedure. Given the presence of the patient's known bulky hypermetabolic right cervical lymphadenopathy, the decision was made to place a left internal jugular approach port a catheter. The left neck and chest were prepped with chlorhexidine in a sterile fashion, and a sterile drape was applied covering the operative field. Maximum barrier sterile technique with sterile gowns and gloves were used for the procedure. A timeout was performed prior to the initiation of the procedure. Local anesthesia was provided with 1% lidocaine with epinephrine. After creating a small venotomy incision, a micropuncture kit was utilized to access the internal jugular vein under direct, real-time ultrasound guidance. Ultrasound image documentation was performed. As there was difficulty advancing the micro wire centrally, a limited venogram was performed with via the inner 3 French sheath of the micro puncture kit demonstrating tortuosity but patency of the central aspect the left internal jugular vein. Ultimately, the micro wire was advanced to the level of the superior cavoatrial junction. The microwire was kinked to measure appropriate catheter length. A subcutaneous port pocket was then created along the upper chest wall utilizing a combination of sharp and blunt dissection. The pocket was irrigated with sterile saline. A single lumen ISP power injectable port was chosen for placement. The 8 Fr catheter was tunneled from the port pocket site to the venotomy incision. The port was placed in the pocket. The external catheter was trimmed to appropriate length. At the venotomy, an 8 Fr peel-away sheath was placed over a guidewire under fluoroscopic guidance. The catheter was then placed through the sheath and the sheath was removed. Final catheter positioning was confirmed and documented with a fluoroscopic spot radiograph. The port was accessed with a Huber needle, aspirated and flushed with heparinized saline. The  venotomy site was closed with an interrupted 4-0 Vicryl suture. The port pocket incision was closed with interrupted 2-0 Vicryl suture and the skin was opposed with a running subcuticular 4-0 Vicryl suture. Dermabond and Steri-strips were applied to both incisions. Dressings were placed. The patient tolerated the procedure well without immediate post procedural complication. FINDINGS: After catheter placement, the tip lies within the superior cavoatrial junction. The catheter aspirates and flushes normally and is ready for immediate use. IMPRESSION: Successful placement of a left internal jugular approach power injectable Port-A-Cath. The catheter is ready for immediate use. Electronically Signed   By: Sandi Mariscal M.D.   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 I reviewed the PET CT scan with her and her husband. Overall, she has stage II disease, non-bulky. I do not believe she needs bone marrow biopsy. Her blood count is completely normal. We discussed the current guidelines. We discussed low risk disease and excellent prognosis in  the long run. She does not need CNS prophylaxis. We discussed the role of chemotherapy. The intent is for cure. The decision was made based on publication at the [facility]. It is a category 1 recommendation from NCCN.  CHOP Chemotherapy plus Rituximab Compared with CHOP Alone in Elderly Patients with Diffuse Large-B-Cell Lymphoma Jannet Askew, M.D., Rexene Edison, M.D., Ph.D., Farley Ly, M.D., Crawford Givens, M.D., Knox Royalty, M.D., Ricky Ala, M.D., Doree Fudge, M.D., Eppie Gibson Richarda Blade, M.D., Simona Huh, M.D., Ph.D., Jolene Schimke, M.D., Bishop Limbo, M.D., Evelene Croon, Evelene Croon, Ph.D., and Sallyanne Kuster, M.D. Alison Stalling J Med 2002; 346:235-242January  24, 2002DOI: 10.1056/[id number]  The rate of complete response was significantly higher in the group that received CHOP plus rituximab than in the group that received CHOP alone (76 percent vs. 63 percent, P=0.005). With a median follow-up of two years, event-free and overall survival times were significantly higher in the CHOP-plus-rituximab group (P<0.001 and P=0.007, respectively). The addition of rituximab to standard CHOP chemotherapy significantly reduced the risk of treatment failure and death (risk ratios, 0.58 [95 percent confidence interval, 0.44 to 0.77] and 0.64 [0.45 to 0.89], respectively). Clinically relevant toxicity was not significantly greater with CHOP plus rituximab.  We discussed some of the risks, benefits and side-effects of Rituximab,Cytoxan, Adriamycin,Vincristine and Solumedrol/Prednisone.   Some of the short term side-effects included, though not limited to, risk of fatigue, weight loss, tumor lysis syndrome, risk of allergic reactions, pancytopenia, life-threatening infections, need for transfusions of blood products, nausea, vomiting, change in bowel habits, hair loss, risk of congestive heart failure, admission to hospital for various reasons, and risks of death.   Long term side-effects are also discussed including permanent damage to nerve function, chronic fatigue, and rare secondary malignancy including bone marrow disorders.   The patient is aware that the response rates discussed earlier is not guaranteed.    After a long discussion, patient made an informed decision to proceed with the prescribed plan of care and went ahead to sign the consent form today.   Patient education material was dispensed I will provide G-CSF support to reduce risk of infection. I plan to repeat staging after 3 cycles of treatment. I recommend her not to work for 6 months while on treatment.   I filled several paperwork for application for FMLA/disability I provided allopurinol for  tumor lysis prophylaxis I will see her prior to cycle 2 of treatment.    Orders Placed This Encounter  Procedures  . CBC with Differential    Standing Status:   Standing    Number of Occurrences:   20    Standing Expiration Date:   10/10/2016  . Comprehensive metabolic panel    Standing Status:   Standing    Number of Occurrences:   20    Standing Expiration Date:   10/10/2016   All questions were answered. The patient knows to call the clinic with any problems, questions or concerns. No barriers to learning was detected. I spent 60 minutes counseling the patient face to face. The total time spent in the appointment was 80 minutes and more than 50% was on counseling and review of test results     [facility], [place], MD 10/10/2015 3:05 PM

## 2015-10-10 NOTE — Assessment & Plan Note (Signed)
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 I reviewed the PET CT scan with her and her husband. Overall, she has stage II disease, non-bulky. I do not believe she needs bone marrow biopsy. Her blood count is completely normal. We discussed the current guidelines. We discussed low risk disease and excellent prognosis in the long run. She does not need CNS prophylaxis. We discussed the role of chemotherapy. The intent is for cure. The decision was made based on publication at the NEJM. It is a category 1 recommendation from NCCN.  CHOP Chemotherapy plus Rituximab Compared with CHOP Alone in Elderly Patients with Diffuse Large-B-Cell Lymphoma Bertrand Coiffier, M.D., Eric Lepage, M.D., Ph.D., Josette Brire, M.D., Raoul Herbrecht, M.D., Herv Tilly, M.D., Reda Bouabdallah, M.D., Pierre Morel, M.D., Eric Van Den [place]este, M.D., Gilles Salles, M.D., Ph.D., Philippe Gaulard, M.D., Felix Reyes, M.D., Pierre Lederlin, Pierre Lederlin, Ph.D., and Christian Gisselbrecht, M.D. N Engl J Med 2002; 346:235-242January 24, 2002DOI: 10.1056/[email or web address]  The rate of complete response was significantly higher in the group that received CHOP plus rituximab than in the group that received CHOP alone (76 percent vs. 63 percent, P=0.005). With a median follow-up of two years, event-free and overall survival times were significantly higher in the CHOP-plus-rituximab group (P<0.001 and P=0.007, respectively). The addition of rituximab to standard CHOP chemotherapy significantly reduced the risk of treatment failure and death (risk ratios, 0.58 [95 percent confidence interval, 0.44 to 0.77] and 0.64 [0.45 to 0.89], respectively). Clinically relevant toxicity was not significantly greater with CHOP plus rituximab.  We discussed some of the risks, benefits and side-effects of Rituximab,Cytoxan, Adriamycin,Vincristine and Solumedrol/Prednisone.    Some of the short term side-effects included, though not limited to, risk of fatigue, weight loss, tumor lysis syndrome, risk of allergic reactions, pancytopenia, life-threatening infections, need for transfusions of blood products, nausea, vomiting, change in bowel habits, hair loss, risk of congestive heart failure, admission to hospital for various reasons, and risks of death.   Long term side-effects are also discussed including permanent damage to nerve function, chronic fatigue, and rare secondary malignancy including bone marrow disorders.   The patient is aware that the response rates discussed earlier is not guaranteed.    After a long discussion, patient made an informed decision to proceed with the prescribed plan of care and went ahead to sign the consent form today.   Patient education material was dispensed I will provide G-CSF support to reduce risk of infection. I plan to repeat staging after 3 cycles of treatment. I recommend her not to work for 6 months while on treatment.   I filled several paperwork for application for FMLA/disability I provided allopurinol for tumor lysis prophylaxis I will see her prior to cycle 2 of treatment.  

## 2015-10-10 NOTE — Telephone Encounter (Signed)
Added appointments for October. Spoke with patient she is aware and will get new schedule 9/18.

## 2015-10-13 ENCOUNTER — Other Ambulatory Visit: Payer: Self-pay | Admitting: Hematology and Oncology

## 2015-10-13 ENCOUNTER — Encounter: Payer: Self-pay | Admitting: Hematology and Oncology

## 2015-10-13 ENCOUNTER — Ambulatory Visit (HOSPITAL_BASED_OUTPATIENT_CLINIC_OR_DEPARTMENT_OTHER): Payer: 59

## 2015-10-13 VITALS — BP 129/65 | HR 90 | Temp 98.3°F | Resp 18

## 2015-10-13 DIAGNOSIS — Z5111 Encounter for antineoplastic chemotherapy: Secondary | ICD-10-CM | POA: Diagnosis not present

## 2015-10-13 DIAGNOSIS — C8331 Diffuse large B-cell lymphoma, lymph nodes of head, face, and neck: Secondary | ICD-10-CM

## 2015-10-13 DIAGNOSIS — Z5112 Encounter for antineoplastic immunotherapy: Secondary | ICD-10-CM | POA: Diagnosis not present

## 2015-10-13 MED ORDER — VINCRISTINE SULFATE CHEMO INJECTION 1 MG/ML
2.0000 mg | Freq: Once | INTRAVENOUS | Status: AC
  Administered 2015-10-13: 2 mg via INTRAVENOUS
  Filled 2015-10-13: qty 2

## 2015-10-13 MED ORDER — PALONOSETRON HCL INJECTION 0.25 MG/5ML
0.2500 mg | Freq: Once | INTRAVENOUS | Status: AC
  Administered 2015-10-13: 0.25 mg via INTRAVENOUS

## 2015-10-13 MED ORDER — SODIUM CHLORIDE 0.9 % IV SOLN
375.0000 mg/m2 | Freq: Once | INTRAVENOUS | Status: AC
  Administered 2015-10-13: 800 mg via INTRAVENOUS
  Filled 2015-10-13: qty 50

## 2015-10-13 MED ORDER — DIPHENHYDRAMINE HCL 25 MG PO CAPS
ORAL_CAPSULE | ORAL | Status: AC
  Filled 2015-10-13: qty 2

## 2015-10-13 MED ORDER — SODIUM CHLORIDE 0.9 % IV SOLN
750.0000 mg/m2 | Freq: Once | INTRAVENOUS | Status: AC
  Administered 2015-10-13: 1560 mg via INTRAVENOUS
  Filled 2015-10-13: qty 78

## 2015-10-13 MED ORDER — DIPHENHYDRAMINE HCL 25 MG PO CAPS
50.0000 mg | ORAL_CAPSULE | Freq: Once | ORAL | Status: AC
  Administered 2015-10-13: 50 mg via ORAL

## 2015-10-13 MED ORDER — DEXAMETHASONE SODIUM PHOSPHATE 100 MG/10ML IJ SOLN
10.0000 mg | Freq: Once | INTRAMUSCULAR | Status: AC
  Administered 2015-10-13: 10 mg via INTRAVENOUS
  Filled 2015-10-13: qty 1

## 2015-10-13 MED ORDER — ACETAMINOPHEN 325 MG PO TABS
650.0000 mg | ORAL_TABLET | Freq: Once | ORAL | Status: AC
  Administered 2015-10-13: 650 mg via ORAL

## 2015-10-13 MED ORDER — SODIUM CHLORIDE 0.9 % IV SOLN
Freq: Once | INTRAVENOUS | Status: AC
  Administered 2015-10-13: 10:00:00 via INTRAVENOUS

## 2015-10-13 MED ORDER — ACETAMINOPHEN 325 MG PO TABS
ORAL_TABLET | ORAL | Status: AC
  Filled 2015-10-13: qty 2

## 2015-10-13 MED ORDER — PALONOSETRON HCL INJECTION 0.25 MG/5ML
INTRAVENOUS | Status: AC
  Filled 2015-10-13: qty 5

## 2015-10-13 MED ORDER — HEPARIN SOD (PORK) LOCK FLUSH 100 UNIT/ML IV SOLN
500.0000 [IU] | Freq: Once | INTRAVENOUS | Status: AC | PRN
  Administered 2015-10-13: 500 [IU]
  Filled 2015-10-13: qty 5

## 2015-10-13 MED ORDER — SODIUM CHLORIDE 0.9% FLUSH
10.0000 mL | INTRAVENOUS | Status: DC | PRN
  Administered 2015-10-13: 10 mL
  Filled 2015-10-13: qty 10

## 2015-10-13 MED ORDER — DOXORUBICIN HCL CHEMO IV INJECTION 2 MG/ML
50.0000 mg/m2 | Freq: Once | INTRAVENOUS | Status: AC
  Administered 2015-10-13: 104 mg via INTRAVENOUS
  Filled 2015-10-13: qty 52

## 2015-10-13 NOTE — Patient Instructions (Signed)
[facility] Discharge Instructions for Patients Receiving Chemotherapy  Today you received the following chemotherapy agents: Adriamycin, Cytoxan, Vincristine and Rituxan.  To help prevent nausea and vomiting after your treatment, we encourage you to take your nausea medication as directed.   If you develop nausea and vomiting that is not controlled by your nausea medication, call the clinic.   BELOW ARE SYMPTOMS THAT SHOULD BE REPORTED IMMEDIATELY:  *FEVER GREATER THAN 100.5 F  *CHILLS WITH OR WITHOUT FEVER  NAUSEA AND VOMITING THAT IS NOT CONTROLLED WITH YOUR NAUSEA MEDICATION  *UNUSUAL SHORTNESS OF BREATH  *UNUSUAL BRUISING OR BLEEDING  TENDERNESS IN MOUTH AND THROAT WITH OR WITHOUT PRESENCE OF ULCERS  *URINARY PROBLEMS  *BOWEL PROBLEMS  UNUSUAL RASH Items with * indicate a potential emergency and should be followed up as soon as possible.  Feel free to call the clinic you have any questions or concerns. The clinic phone number is [phone].  Please show the [facility] at check-in to the Emergency Department and triage nurse. Cyclophosphamide injection What is this medicine? CYCLOPHOSPHAMIDE (sye kloe FOSS fa mide) is a chemotherapy drug. It slows the growth of cancer cells. This medicine is used to treat many types of cancer like lymphoma, myeloma, leukemia, breast cancer, and ovarian cancer, to name a few. This medicine may be used for other purposes; ask your health care provider or pharmacist if you have questions. What should I tell my health care provider before I take this medicine? They need to know if you have any of these conditions: -blood disorders -history of other chemotherapy -infection -kidney disease -liver disease -recent or ongoing radiation therapy -tumors in the bone marrow -an unusual or allergic reaction to cyclophosphamide, other chemotherapy, other medicines, foods, dyes, or preservatives -pregnant or trying to get  pregnant -breast-feeding How should I use this medicine? This drug is usually given as an injection into a vein or muscle or by infusion into a vein. It is administered in a hospital or clinic by a specially trained health care professional. Talk to your pediatrician regarding the use of this medicine in children. Special care may be needed. Overdosage: If you think you have taken too much of this medicine contact a poison control center or emergency room at once. NOTE: This medicine is only for you. Do not share this medicine with others. What if I miss a dose? It is important not to miss your dose. Call your doctor or health care professional if you are unable to keep an appointment. What may interact with this medicine? This medicine may interact with the following medications: -amiodarone -amphotericin B -azathioprine -certain antiviral medicines for HIV or AIDS such as protease inhibitors (e.g., indinavir, ritonavir) and zidovudine -certain blood pressure medications such as benazepril, captopril, enalapril, fosinopril, lisinopril, moexipril, monopril, perindopril, quinapril, ramipril, trandolapril -certain cancer medications such as anthracyclines (e.g., daunorubicin, doxorubicin), busulfan, cytarabine, paclitaxel, pentostatin, tamoxifen, trastuzumab -certain diuretics such as chlorothiazide, chlorthalidone, hydrochlorothiazide, indapamide, metolazone -certain medicines that treat or prevent blood clots like warfarin -certain muscle relaxants such as succinylcholine -cyclosporine -etanercept -indomethacin -medicines to increase blood counts like filgrastim, pegfilgrastim, sargramostim -medicines used as general anesthesia -metronidazole -natalizumab This list may not describe all possible interactions. Give your health care provider a list of all the medicines, herbs, non-prescription drugs, or dietary supplements you use. Also tell them if you smoke, drink alcohol, or use illegal  drugs. Some items may interact with your medicine. What should I watch for while using this medicine?  Visit your doctor for checks on your progress. This drug may make you feel generally unwell. This is not uncommon, as chemotherapy can affect healthy cells as well as cancer cells. Report any side effects. Continue your course of treatment even though you feel ill unless your doctor tells you to stop. Drink water or other fluids as directed. Urinate often, even at night. In some cases, you may be given additional medicines to help with side effects. Follow all directions for their use. Call your doctor or health care professional for advice if you get a fever, chills or sore throat, or other symptoms of a cold or flu. Do not treat yourself. This drug decreases your body's ability to fight infections. Try to avoid being around people who are sick. This medicine may increase your risk to bruise or bleed. Call your doctor or health care professional if you notice any unusual bleeding. Be careful brushing and flossing your teeth or using a toothpick because you may get an infection or bleed more easily. If you have any dental work done, tell your dentist you are receiving this medicine. You may get drowsy or dizzy. Do not drive, use machinery, or do anything that needs mental alertness until you know how this medicine affects you. Do not become pregnant while taking this medicine or for 1 year after stopping it. Women should inform their doctor if they wish to become pregnant or think they might be pregnant. Men should not father a child while taking this medicine and for 4 months after stopping it. There is a potential for serious side effects to an unborn child. Talk to your health care professional or pharmacist for more information. Do not breast-feed an infant while taking this medicine. This medicine may interfere with the ability to have a child. This medicine has caused ovarian failure in some women.  This medicine has caused reduced sperm counts in some men. You should talk with your doctor or health care professional if you are concerned about your fertility. If you are going to have surgery, tell your doctor or health care professional that you have taken this medicine. What side effects may I notice from receiving this medicine? Side effects that you should report to your doctor or health care professional as soon as possible: -allergic reactions like skin rash, itching or hives, swelling of the face, lips, or tongue -low blood counts - this medicine may decrease the number of white blood cells, red blood cells and platelets. You may be at increased risk for infections and bleeding. -signs of infection - fever or chills, cough, sore throat, pain or difficulty passing urine -signs of decreased platelets or bleeding - bruising, pinpoint red spots on the skin, black, tarry stools, blood in the urine -signs of decreased red blood cells - unusually weak or tired, fainting spells, lightheadedness -breathing problems -dark urine -dizziness -palpitations -swelling of the ankles, feet, hands -trouble passing urine or change in the amount of urine -weight gain -yellowing of the eyes or skin Side effects that usually do not require medical attention (report to your doctor or health care professional if they continue or are bothersome): -changes in nail or skin color -hair loss -missed menstrual periods -mouth sores -nausea, vomiting This list may not describe all possible side effects. Call your doctor for medical advice about side effects. You may report side effects to FDA at 1-800-FDA-1088. Where should I keep my medicine? This drug is given in a hospital or clinic and will not  be stored at home. NOTE: This sheet is a summary. It may not cover all possible information. If you have questions about this medicine, talk to your doctor, pharmacist, or health care provider.    2016,  Elsevier/Gold Standard. (2011-11-26 16:22:58) Doxorubicin injection What is this medicine? DOXORUBICIN (dox oh ROO bi sin) is a chemotherapy drug. It is used to treat many kinds of cancer like Hodgkin's disease, leukemia, non-Hodgkin's lymphoma, neuroblastoma, sarcoma, and Wilms' tumor. It is also used to treat bladder cancer, breast cancer, lung cancer, ovarian cancer, stomach cancer, and thyroid cancer. This medicine may be used for other purposes; ask your health care provider or pharmacist if you have questions. What should I tell my health care provider before I take this medicine? They need to know if you have any of these conditions: -blood disorders -heart disease, recent heart attack -infection (especially a virus infection such as chickenpox, cold sores, or herpes) -irregular heartbeat -liver disease -recent or ongoing radiation therapy -an unusual or allergic reaction to doxorubicin, other chemotherapy agents, other medicines, foods, dyes, or preservatives -pregnant or trying to get pregnant -breast-feeding How should I use this medicine? This drug is given as an infusion into a vein. It is administered in a hospital or clinic by a specially trained health care professional. If you have pain, swelling, burning or any unusual feeling around the site of your injection, tell your health care professional right away. Talk to your pediatrician regarding the use of this medicine in children. Special care may be needed. Overdosage: If you think you have taken too much of this medicine contact a poison control center or emergency room at once. NOTE: This medicine is only for you. Do not share this medicine with others. What if I miss a dose? It is important not to miss your dose. Call your doctor or health care professional if you are unable to keep an appointment. What may interact with this medicine? Do not take this medicine with any of the following  medications: -cisapride -droperidol -halofantrine -pimozide -zidovudine This medicine may also interact with the following medications: -chloroquine -chlorpromazine -clarithromycin -cyclophosphamide -cyclosporine -erythromycin -medicines for depression, anxiety, or psychotic disturbances -medicines for irregular heart beat like amiodarone, bepridil, dofetilide, encainide, flecainide, propafenone, quinidine -medicines for seizures like ethotoin, fosphenytoin, phenytoin -medicines for nausea, vomiting like dolasetron, ondansetron, palonosetron -medicines to increase blood counts like filgrastim, pegfilgrastim, sargramostim -methadone -methotrexate -pentamidine -progesterone -vaccines -verapamil Talk to your doctor or health care professional before taking any of these medicines: -acetaminophen -aspirin -ibuprofen -ketoprofen -naproxen This list may not describe all possible interactions. Give your health care provider a list of all the medicines, herbs, non-prescription drugs, or dietary supplements you use. Also tell them if you smoke, drink alcohol, or use illegal drugs. Some items may interact with your medicine. What should I watch for while using this medicine? Your condition will be monitored carefully while you are receiving this medicine. You will need important blood work done while you are taking this medicine. This drug may make you feel generally unwell. This is not uncommon, as chemotherapy can affect healthy cells as well as cancer cells. Report any side effects. Continue your course of treatment even though you feel ill unless your doctor tells you to stop. Your urine may turn red for a few days after your dose. This is not blood. If your urine is dark or brown, call your doctor. In some cases, you may be given additional medicines to help with side effects. Follow all  directions for their use. Call your doctor or health care professional for advice if you get a  fever, chills or sore throat, or other symptoms of a cold or flu. Do not treat yourself. This drug decreases your body's ability to fight infections. Try to avoid being around people who are sick. This medicine may increase your risk to bruise or bleed. Call your doctor or health care professional if you notice any unusual bleeding. Be careful brushing and flossing your teeth or using a toothpick because you may get an infection or bleed more easily. If you have any dental work done, tell your dentist you are receiving this medicine. Avoid taking products that contain aspirin, acetaminophen, ibuprofen, naproxen, or ketoprofen unless instructed by your doctor. These medicines may hide a fever. Men and women of childbearing age should use effective birth control methods while using taking this medicine. Do not become pregnant while taking this medicine. There is a potential for serious side effects to an unborn child. Talk to your health care professional or pharmacist for more information. Do not breast-feed an infant while taking this medicine. Do not let others touch your urine or other body fluids for 5 days after each treatment with this medicine. Caregivers should wear latex gloves to avoid touching body fluids during this time. There is a maximum amount of this medicine you should receive throughout your life. The amount depends on the medical condition being treated and your overall health. Your doctor will watch how much of this medicine you receive in your lifetime. Tell your doctor if you have taken this medicine before. What side effects may I notice from receiving this medicine? Side effects that you should report to your doctor or health care professional as soon as possible: -allergic reactions like skin rash, itching or hives, swelling of the face, lips, or tongue -low blood counts - this medicine may decrease the number of white blood cells, red blood cells and platelets. You may be at  increased risk for infections and bleeding. -signs of infection - fever or chills, cough, sore throat, pain or difficulty passing urine -signs of decreased platelets or bleeding - bruising, pinpoint red spots on the skin, black, tarry stools, blood in the urine -signs of decreased red blood cells - unusually weak or tired, fainting spells, lightheadedness -breathing problems -chest pain -fast, irregular heartbeat -mouth sores -nausea, vomiting -pain, swelling, redness at site where injected -pain, tingling, numbness in the hands or feet -swelling of ankles, feet, or hands -unusual bleeding or bruising Side effects that usually do not require medical attention (report to your doctor or health care professional if they continue or are bothersome): -diarrhea -facial flushing -hair loss -loss of appetite -missed menstrual periods -nail discoloration or damage -red or watery eyes -red colored urine -stomach upset This list may not describe all possible side effects. Call your doctor for medical advice about side effects. You may report side effects to FDA at 1-800-FDA-1088. Where should I keep my medicine? This drug is given in a hospital or clinic and will not be stored at home. NOTE: This sheet is a summary. It may not cover all possible information. If you have questions about this medicine, talk to your doctor, pharmacist, or health care provider.    2016, [employer]/Gold Standard. (2012-05-09 09:54:34) Rituximab injection What is this medicine? RITUXIMAB (ri TUX i mab) is a monoclonal antibody. It is used commonly to treat non-Hodgkin lymphoma and other conditions. It is also used to treat rheumatoid arthritis (  RA). In RA, this medicine slows the inflammatory process and help reduce joint pain and swelling. This medicine is often used with other cancer or arthritis medications. This medicine may be used for other purposes; ask your health care provider or pharmacist if you have  questions. What should I tell my health care provider before I take this medicine? They need to know if you have any of these conditions: -blood disorders -heart disease -history of hepatitis B -infection (especially a virus infection such as chickenpox, cold sores, or herpes) -irregular heartbeat -kidney disease -lung or breathing disease, like asthma -lupus -an unusual or allergic reaction to rituximab, mouse proteins, other medicines, foods, dyes, or preservatives -pregnant or trying to get pregnant -breast-feeding How should I use this medicine? This medicine is for infusion into a vein. It is administered in a hospital or clinic by a specially trained health care professional. A special MedGuide will be given to you by the pharmacist with each prescription and refill. Be sure to read this information carefully each time. Talk to your pediatrician regarding the use of this medicine in children. This medicine is not approved for use in children. Overdosage: If you think you have taken too much of this medicine contact a poison control center or emergency room at once. NOTE: This medicine is only for you. Do not share this medicine with others. What if I miss a dose? It is important not to miss a dose. Call your doctor or health care professional if you are unable to keep an appointment. What may interact with this medicine? -cisplatin -medicines for blood pressure -some other medicines for arthritis -vaccines This list may not describe all possible interactions. Give your health care provider a list of all the medicines, herbs, non-prescription drugs, or dietary supplements you use. Also tell them if you smoke, drink alcohol, or use illegal drugs. Some items may interact with your medicine. What should I watch for while using this medicine? Report any side effects that you notice during your treatment right away, such as changes in your breathing, fever, chills, dizziness or  lightheadedness. These effects are more common with the first dose. Visit your prescriber or health care professional for checks on your progress. You will need to have regular blood work. Report any other side effects. The side effects of this medicine can continue after you finish your treatment. Continue your course of treatment even though you feel ill unless your doctor tells you to stop. Call your doctor or health care professional for advice if you get a fever, chills or sore throat, or other symptoms of a cold or flu. Do not treat yourself. This drug decreases your body's ability to fight infections. Try to avoid being around people who are sick. This medicine may increase your risk to bruise or bleed. Call your doctor or health care professional if you notice any unusual bleeding. Be careful brushing and flossing your teeth or using a toothpick because you may get an infection or bleed more easily. If you have any dental work done, tell your dentist you are receiving this medicine. Avoid taking products that contain aspirin, acetaminophen, ibuprofen, naproxen, or ketoprofen unless instructed by your doctor. These medicines may hide a fever. Do not become pregnant while taking this medicine. Women should inform their doctor if they wish to become pregnant or think they might be pregnant. There is a potential for serious side effects to an unborn child. Talk to your health care professional or pharmacist for more  information. Do not breast-feed an infant while taking this medicine. What side effects may I notice from receiving this medicine? Side effects that you should report to your doctor or health care professional as soon as possible: -allergic reactions like skin rash, itching or hives, swelling of the face, lips, or tongue -low blood counts - this medicine may decrease the number of white blood cells, red blood cells and platelets. You may be at increased risk for infections and  bleeding. -signs of infection - fever or chills, cough, sore throat, pain or difficulty passing urine -signs of decreased platelets or bleeding - bruising, pinpoint red spots on the skin, black, tarry stools, blood in the urine -signs of decreased red blood cells - unusually weak or tired, fainting spells, lightheadedness -breathing problems -confused, not responsive -chest pain -fast, irregular heartbeat -feeling faint or lightheaded, falls -mouth sores -redness, blistering, peeling or loosening of the skin, including inside the mouth -stomach pain -swelling of the ankles, feet, or hands -trouble passing urine or change in the amount of urine Side effects that usually do not require medical attention (report to your doctor or other health care professional if they continue or are bothersome): -anxiety -headache -loss of appetite -muscle aches -nausea -night sweats This list may not describe all possible side effects. Call your doctor for medical advice about side effects. You may report side effects to FDA at 1-800-FDA-1088. Where should I keep my medicine? This drug is given in a hospital or clinic and will not be stored at home. NOTE: This sheet is a summary. It may not cover all possible information. If you have questions about this medicine, talk to your doctor, pharmacist, or health care provider.    2016, [employer]/Gold Standard. (2014-03-20 22:30:56) Vincristine injection What is this medicine? VINCRISTINE (vin KRIS teen) is a chemotherapy drug. It slows the growth of cancer cells. This medicine is used to treat many types of cancer like Hodgkin's disease, leukemia, non-Hodgkin's lymphoma, neuroblastoma (brain cancer), rhabdomyosarcoma, and Wilms' tumor. This medicine may be used for other purposes; ask your health care provider or pharmacist if you have questions. What should I tell my health care provider before I take this medicine? They need to know if you have any of  these conditions: -blood disorders -gout -infection (especially chickenpox, cold sores, or herpes) -kidney disease -liver disease -lung disease -nervous system disease like Charcot-Marie-Tooth (CMT) -recent or ongoing radiation therapy -an unusual or allergic reaction to vincristine, other chemotherapy agents, other medicines, foods, dyes, or preservatives -pregnant or trying to get pregnant -breast-feeding How should I use this medicine? This drug is given as an infusion into a vein. It is administered in a hospital or clinic by a specially trained health care professional. If you have pain, swelling, burning, or any unusual feeling around the site of your injection, tell your health care professional right away. Talk to your pediatrician regarding the use of this medicine in children. While this drug may be prescribed for selected conditions, precautions do apply. Overdosage: If you think you have taken too much of this medicine contact a poison control center or emergency room at once. NOTE: This medicine is only for you. Do not share this medicine with others. What if I miss a dose? It is important not to miss your dose. Call your doctor or health care professional if you are unable to keep an appointment. What may interact with this medicine? Do not take this medicine with any of the following medications: -itraconazole -mibefradil -  voriconazole This medicine may also interact with the following medications: -cyclosporine -erythromycin -fluconazole -ketoconazole -medicines for HIV like delavirdine, efavirenz, nevirapine -medicines for seizures like ethotoin, fosphenotoin, phenytoin -medicines to increase blood counts like filgrastim, pegfilgrastim, sargramostim -other chemotherapy drugs like cisplatin, L-asparaginase, methotrexate, mitomycin, paclitaxel -pegaspargase -vaccines -zalcitabine, ddC Talk to your doctor or health care professional before taking any of these  medicines: -acetaminophen -aspirin -ibuprofen -ketoprofen -naproxen This list may not describe all possible interactions. Give your health care provider a list of all the medicines, herbs, non-prescription drugs, or dietary supplements you use. Also tell them if you smoke, drink alcohol, or use illegal drugs. Some items may interact with your medicine. What should I watch for while using this medicine? Your condition will be monitored carefully while you are receiving this medicine. You will need important blood work done while you are taking this medicine. This drug may make you feel generally unwell. This is not uncommon, as chemotherapy can affect healthy cells as well as cancer cells. Report any side effects. Continue your course of treatment even though you feel ill unless your doctor tells you to stop. In some cases, you may be given additional medicines to help with side effects. Follow all directions for their use. Call your doctor or health care professional for advice if you get a fever, chills or sore throat, or other symptoms of a cold or flu. Do not treat yourself. Avoid taking products that contain aspirin, acetaminophen, ibuprofen, naproxen, or ketoprofen unless instructed by your doctor. These medicines may hide a fever. Do not become pregnant while taking this medicine. Women should inform their doctor if they wish to become pregnant or think they might be pregnant. There is a potential for serious side effects to an unborn child. Talk to your health care professional or pharmacist for more information. Do not breast-feed an infant while taking this medicine. Men may have a lower sperm count while taking this medicine. Talk to your doctor if you plan to father a child. What side effects may I notice from receiving this medicine? Side effects that you should report to your doctor or health care professional as soon as possible: -allergic reactions like skin rash, itching or hives,  swelling of the face, lips, or tongue -breathing problems -confusion or changes in emotions or moods -constipation -cough -mouth sores -muscle weakness -nausea and vomiting -pain, swelling, redness or irritation at the injection site -pain, tingling, numbness in the hands or feet -problems with balance, talking, walking -seizures -stomach pain -trouble passing urine or change in the amount of urine Side effects that usually do not require medical attention (report to your doctor or health care professional if they continue or are bothersome): -diarrhea -hair loss -jaw pain -loss of appetite This list may not describe all possible side effects. Call your doctor for medical advice about side effects. You may report side effects to FDA at 1-800-FDA-1088. Where should I keep my medicine? This drug is given in a hospital or clinic and will not be stored at home. NOTE: This sheet is a summary. It may not cover all possible information. If you have questions about this medicine, talk to your doctor, pharmacist, or health care provider.    2016, [employer]vier/Gold Standard. (2007-10-09 17:17:13)

## 2015-10-13 NOTE — Progress Notes (Signed)
Enrolled pt in the [employer] program.  Faxed signed form and activated card today.  °

## 2015-10-14 ENCOUNTER — Ambulatory Visit: Payer: 59

## 2015-10-14 ENCOUNTER — Telehealth: Payer: Self-pay | Admitting: *Deleted

## 2015-10-14 NOTE — Telephone Encounter (Signed)
Called patient for chemo follow. Denies any nausea or vomiting, bowels moving well. Eating and drinking without problems. No fever. No questions or concerns

## 2015-10-15 ENCOUNTER — Ambulatory Visit (HOSPITAL_BASED_OUTPATIENT_CLINIC_OR_DEPARTMENT_OTHER): Payer: 59

## 2015-10-15 VITALS — BP 124/81 | HR 77 | Temp 98.3°F | Resp 18

## 2015-10-15 DIAGNOSIS — C8331 Diffuse large B-cell lymphoma, lymph nodes of head, face, and neck: Secondary | ICD-10-CM | POA: Diagnosis not present

## 2015-10-15 DIAGNOSIS — Z5189 Encounter for other specified aftercare: Secondary | ICD-10-CM

## 2015-10-15 MED ORDER — PEGFILGRASTIM INJECTION 6 MG/0.6ML ~~LOC~~
6.0000 mg | PREFILLED_SYRINGE | Freq: Once | SUBCUTANEOUS | Status: AC
  Administered 2015-10-15: 6 mg via SUBCUTANEOUS
  Filled 2015-10-15: qty 0.6

## 2015-10-17 ENCOUNTER — Telehealth: Payer: Self-pay | Admitting: *Deleted

## 2015-10-17 NOTE — Telephone Encounter (Signed)
Patient called stating that she received chemotherapy this past Monday. Patient is now complaining of mouth tingling and white patches in mouth. Routed to providers.

## 2015-10-20 ENCOUNTER — Telehealth: Payer: Self-pay | Admitting: *Deleted

## 2015-10-20 NOTE — Telephone Encounter (Signed)
Can you call her? She may have thrush? If so, call in fluconazole 100 mg daily x 7 days If she is in severe pain or not able to swallow, she may need to be seen today

## 2015-10-20 NOTE — Telephone Encounter (Signed)
Pt states her daughter looked in her mouth and she does not have any white patches.  Pt denies any mouth pain or sores and says she is eating and drinking fine. Denies any needs.

## 2015-11-03 ENCOUNTER — Telehealth: Payer: Self-pay | Admitting: Hematology and Oncology

## 2015-11-03 ENCOUNTER — Ambulatory Visit (HOSPITAL_BASED_OUTPATIENT_CLINIC_OR_DEPARTMENT_OTHER): Payer: 59 | Admitting: Hematology and Oncology

## 2015-11-03 ENCOUNTER — Other Ambulatory Visit (HOSPITAL_BASED_OUTPATIENT_CLINIC_OR_DEPARTMENT_OTHER): Payer: 59

## 2015-11-03 ENCOUNTER — Telehealth: Payer: Self-pay | Admitting: *Deleted

## 2015-11-03 ENCOUNTER — Ambulatory Visit (HOSPITAL_BASED_OUTPATIENT_CLINIC_OR_DEPARTMENT_OTHER): Payer: 59

## 2015-11-03 ENCOUNTER — Encounter: Payer: Self-pay | Admitting: Hematology and Oncology

## 2015-11-03 ENCOUNTER — Ambulatory Visit: Payer: 59

## 2015-11-03 VITALS — BP 118/75 | HR 82 | Temp 98.6°F | Resp 16

## 2015-11-03 DIAGNOSIS — C8331 Diffuse large B-cell lymphoma, lymph nodes of head, face, and neck: Secondary | ICD-10-CM

## 2015-11-03 DIAGNOSIS — Z5112 Encounter for antineoplastic immunotherapy: Secondary | ICD-10-CM | POA: Diagnosis not present

## 2015-11-03 DIAGNOSIS — D702 Other drug-induced agranulocytosis: Secondary | ICD-10-CM | POA: Diagnosis not present

## 2015-11-03 DIAGNOSIS — R110 Nausea: Secondary | ICD-10-CM

## 2015-11-03 DIAGNOSIS — Z5111 Encounter for antineoplastic chemotherapy: Secondary | ICD-10-CM

## 2015-11-03 DIAGNOSIS — T451X5A Adverse effect of antineoplastic and immunosuppressive drugs, initial encounter: Secondary | ICD-10-CM

## 2015-11-03 DIAGNOSIS — Z95828 Presence of other vascular implants and grafts: Secondary | ICD-10-CM

## 2015-11-03 LAB — COMPREHENSIVE METABOLIC PANEL
ALBUMIN: 3.2 g/dL — AB (ref 3.5–5.0)
ALK PHOS: 68 U/L (ref 40–150)
ALT: 22 U/L (ref 0–55)
ANION GAP: 9 meq/L (ref 3–11)
AST: 17 U/L (ref 5–34)
BUN: 17.2 mg/dL (ref 7.0–26.0)
CALCIUM: 9 mg/dL (ref 8.4–10.4)
CHLORIDE: 108 meq/L (ref 98–109)
CO2: 25 mEq/L (ref 22–29)
CREATININE: 0.7 mg/dL (ref 0.6–1.1)
EGFR: >90 mL/min/{1.73_m2} (ref >90)
Glucose: 89 mg/dl (ref 70–140)
POTASSIUM: 3.9 meq/L (ref 3.5–5.1)
Sodium: 142 mEq/L (ref 136–145)
Total Bilirubin: <0.3 mg/dL (ref 0.20–1.20)
Total Protein: 6.8 g/dL (ref 6.4–8.3)

## 2015-11-03 LAB — CBC WITH DIFFERENTIAL/PLATELET
BASO%: 0.4 % (ref 0.0–2.0)
BASOS ABS: 0 10*3/uL (ref 0.0–0.1)
EOS%: 1.6 % (ref 0.0–7.0)
Eosinophils Absolute: 0 10*3/uL (ref 0.0–0.5)
HEMATOCRIT: 34.6 % — AB (ref 34.8–46.6)
HGB: 11.4 g/dL — ABNORMAL LOW (ref 11.6–15.9)
LYMPH%: 37.8 % (ref 14.0–49.7)
MCH: 28.3 pg (ref 25.1–34.0)
MCHC: 32.9 g/dL (ref 31.5–36.0)
MCV: 85.9 fL (ref 79.5–101.0)
MONO#: 0.5 10*3/uL (ref 0.1–0.9)
MONO%: 18.7 % — ABNORMAL HIGH (ref 0.0–14.0)
NEUT#: 1 10*3/uL — ABNORMAL LOW (ref 1.5–6.5)
NEUT%: 41.5 % (ref 38.4–76.8)
PLATELETS: 279 10*3/uL (ref 145–400)
RBC: 4.03 10*6/uL (ref 3.70–5.45)
RDW: 14.1 % (ref 11.2–14.5)
WBC: 2.5 10*3/uL — ABNORMAL LOW (ref 3.9–10.3)
lymph#: 1 10*3/uL (ref 0.9–3.3)

## 2015-11-03 LAB — LACTATE DEHYDROGENASE: LDH: 192 U/L (ref 125–245)

## 2015-11-03 LAB — URIC ACID: URIC ACID, SERUM: 2.6 mg/dL (ref 2.6–7.4)

## 2015-11-03 MED ORDER — HEPARIN SOD (PORK) LOCK FLUSH 100 UNIT/ML IV SOLN
500.0000 [IU] | Freq: Once | INTRAVENOUS | Status: DC
  Filled 2015-11-03: qty 5

## 2015-11-03 MED ORDER — DOXORUBICIN HCL CHEMO IV INJECTION 2 MG/ML
40.0000 mg/m2 | Freq: Once | INTRAVENOUS | Status: AC
  Administered 2015-11-03: 84 mg via INTRAVENOUS
  Filled 2015-11-03: qty 42

## 2015-11-03 MED ORDER — SODIUM CHLORIDE 0.9 % IV SOLN
600.0000 mg/m2 | Freq: Once | INTRAVENOUS | Status: AC
  Administered 2015-11-03: 1240 mg via INTRAVENOUS
  Filled 2015-11-03: qty 62

## 2015-11-03 MED ORDER — VINCRISTINE SULFATE CHEMO INJECTION 1 MG/ML
2.0000 mg | Freq: Once | INTRAVENOUS | Status: AC
  Administered 2015-11-03: 2 mg via INTRAVENOUS
  Filled 2015-11-03: qty 2

## 2015-11-03 MED ORDER — DIPHENHYDRAMINE HCL 25 MG PO CAPS
50.0000 mg | ORAL_CAPSULE | Freq: Once | ORAL | Status: AC
Start: 2015-11-03 — End: 2015-11-03
  Administered 2015-11-03: 50 mg via ORAL

## 2015-11-03 MED ORDER — ACETAMINOPHEN 325 MG PO TABS
650.0000 mg | ORAL_TABLET | Freq: Once | ORAL | Status: AC
  Administered 2015-11-03: 650 mg via ORAL

## 2015-11-03 MED ORDER — SODIUM CHLORIDE 0.9 % IV SOLN
Freq: Once | INTRAVENOUS | Status: AC
  Administered 2015-11-03: 09:00:00 via INTRAVENOUS

## 2015-11-03 MED ORDER — PALONOSETRON HCL INJECTION 0.25 MG/5ML
0.2500 mg | Freq: Once | INTRAVENOUS | Status: AC
  Administered 2015-11-03: 0.25 mg via INTRAVENOUS

## 2015-11-03 MED ORDER — ACETAMINOPHEN 325 MG PO TABS
ORAL_TABLET | ORAL | Status: AC
  Filled 2015-11-03: qty 2

## 2015-11-03 MED ORDER — DIPHENHYDRAMINE HCL 25 MG PO CAPS
ORAL_CAPSULE | ORAL | Status: AC
  Filled 2015-11-03: qty 2

## 2015-11-03 MED ORDER — SODIUM CHLORIDE 0.9% FLUSH
10.0000 mL | INTRAVENOUS | Status: DC | PRN
  Administered 2015-11-03: 10 mL
  Filled 2015-11-03: qty 10

## 2015-11-03 MED ORDER — HEPARIN SOD (PORK) LOCK FLUSH 100 UNIT/ML IV SOLN
500.0000 [IU] | Freq: Once | INTRAVENOUS | Status: AC | PRN
  Administered 2015-11-03: 500 [IU]
  Filled 2015-11-03: qty 5

## 2015-11-03 MED ORDER — PALONOSETRON HCL INJECTION 0.25 MG/5ML
INTRAVENOUS | Status: AC
  Filled 2015-11-03: qty 5

## 2015-11-03 MED ORDER — SODIUM CHLORIDE 0.9 % IV SOLN
375.0000 mg/m2 | Freq: Once | INTRAVENOUS | Status: AC
  Administered 2015-11-03: 800 mg via INTRAVENOUS
  Filled 2015-11-03: qty 50

## 2015-11-03 MED ORDER — SODIUM CHLORIDE 0.9 % IV SOLN
10.0000 mg | Freq: Once | INTRAVENOUS | Status: AC
  Administered 2015-11-03: 10 mg via INTRAVENOUS
  Filled 2015-11-03: qty 1

## 2015-11-03 MED ORDER — SODIUM CHLORIDE 0.9% FLUSH
10.0000 mL | INTRAVENOUS | Status: DC | PRN
  Administered 2015-11-03: 10 mL via INTRAVENOUS
  Filled 2015-11-03: qty 10

## 2015-11-03 NOTE — Patient Instructions (Signed)
[facility] Discharge Instructions for Patients Receiving Chemotherapy  Today you received the following chemotherapy agents: Adriamycin, Oncovin, Cytoxan, and Rituxan.   To help prevent nausea and vomiting after your treatment, we encourage you to take your nausea medication as directed. DO NOT take zofran until 11/06/15. You may continue to take your compazine as directed.   If you develop nausea and vomiting that is not controlled by your nausea medication, call the clinic.   BELOW ARE SYMPTOMS THAT SHOULD BE REPORTED IMMEDIATELY:  *FEVER GREATER THAN 100.5 F  *CHILLS WITH OR WITHOUT FEVER  NAUSEA AND VOMITING THAT IS NOT CONTROLLED WITH YOUR NAUSEA MEDICATION  *UNUSUAL SHORTNESS OF BREATH  *UNUSUAL BRUISING OR BLEEDING  TENDERNESS IN MOUTH AND THROAT WITH OR WITHOUT PRESENCE OF ULCERS  *URINARY PROBLEMS  *BOWEL PROBLEMS  UNUSUAL RASH Items with * indicate a potential emergency and should be followed up as soon as possible.  Feel free to call the clinic you have any questions or concerns. The clinic phone number is [phone].  Please show the [place] at check-in to the Emergency Department and triage nurse.

## 2015-11-03 NOTE — Telephone Encounter (Signed)
Message sent to chemo scheduler to add chemo per 11/03/15 los. 11/03/15

## 2015-11-03 NOTE — Progress Notes (Signed)
Per Dr. Alvy Bimler okay to treat with WBC of 2.5 and ANC of 1.

## 2015-11-03 NOTE — Telephone Encounter (Signed)
Avs report and appointment schedule given to patient, per 11/03/15 los °

## 2015-11-03 NOTE — Assessment & Plan Note (Signed)
This is related to recent treatment. It resolves with anti-emetics. I will reduce the dose of chemotherapy as above

## 2015-11-03 NOTE — Progress Notes (Signed)
[facility] OFFICE PROGRESS NOTE  Patient Care Team: Haywood Pao, MD as PCP - General (Internal Medicine) Heath Lark, MD as Consulting Physician (Hematology and Oncology) Jodi Marble, MD as Consulting Physician (Otolaryngology)  SUMMARY OF ONCOLOGIC HISTORY:   Diffuse large B-cell lymphoma of lymph nodes of neck ([place])   09/24/2015 Pathology Results    Final Cytologic Interpretation [id number]: Right level II neck mass, Fine Needle Aspiration I (smears and Thinprep): Malignant cells present, most consistent with large cell lymphoma.       09/24/2015 Pathology Results    [phone] The nasopharyngeal biopsy shows lymphoid tissue with diffuse effacement of the normal nodal architecture by large cells with vesicular nuclei, prominent nucleoli, and scant cytoplasm. The large cells are positive for CD20, BCL-6, BCL-2, CD5, and MUM1 by immunohistochemistry. They are negative for CD30, CD3, CD10, cyclinD1 and SOX11. In situ hybridization for EBV mRNA is negative. Ki-67 reveals a proliferation rate of approximately 60%. By flow cytometry, the atypical cells express CD19, CD5, and CD23 (see below). The expression of CD5 and CD23 by flow cytometry is suggestive of small lymphocytic lymphoma/ chronic lymphocytic leukemia (SLL/CLL) transforming to a diffuse large B-cell lymphoma (DLBCL); however, there was no evidence of SLL/CLL in the flow sample. Correlation with peripheral blood/ marrow findings would be useful to determine if this lymphoma arose from background SLL/CLL or is a de novo CD5+ DLBCL. De novo CD5+ DLBCL is frequently associated with an aggressive clinical course and poor response to chemotherapy. A pleomorphic mantle cell lymphoma is less likely since the cells are negative for Cyclin D1 and SOX11. FISH studies are pending and will be reported as an addendum.       09/30/2015 Imaging    CT scan of neck showed bulky RIGHT neck lymphadenopathy concerning for metastatic  disease. 8 mm submucosal RIGHT base of tongue mass, considering ipsilateral lymphadenopathy, this is suspicious for primary head and neck cancer. Isodense LEFT palatine tonsillar mass versus focal lymphoid hyperplasia resulting in LEFT postobstructive middle ear/mastoid effusion.       10/03/2015 Imaging    ECHO showed normal EF      10/08/2015 PET scan    Hypermetabolic left nasopharyngeal and tongue base masses with hypermetabolic bilateral neck adenopathy. Right level 2 adenopathy extends inferiorly into the low right internal jugular station. No additional evidence of metastatic disease in the chest, abdomen or pelvis.      10/09/2015 Procedure    Successful placement of a left internal jugular approach power injectable Port-A-Cath. The catheter is ready for immediate use.      10/13/2015 -  Chemotherapy    The patient had DOXOrubicin (ADRIAMYCIN) chemo injection 104 mg, 50 mg/m2 = 104 mg, Intravenous,  Once, 1 of 6 cycles  palonosetron (ALOXI) injection 0.25 mg, 0.25 mg, Intravenous,  Once, 1 of 6 cycles  pegfilgrastim (NEULASTA) injection 6 mg, 6 mg, Subcutaneous,  Once, 1 of 6 cycles  vinCRIStine (ONCOVIN) 2 mg in sodium chloride 0.9 % 50 mL chemo infusion, 2 mg, Intravenous,  Once, 1 of 6 cycles  riTUXimab (RITUXAN) 800 mg in sodium chloride 0.9 % 250 mL (2.4242 mg/mL) chemo infusion, 375 mg/m2 = 800 mg, Intravenous,  Once, 1 of 1 cycle  cyclophosphamide (CYTOXAN) 1,560 mg in sodium chloride 0.9 % 250 mL chemo infusion, 750 mg/m2 = 1,560 mg, Intravenous,  Once, 1 of 6 cycles  riTUXimab (RITUXAN) 800 mg in sodium chloride 0.9 % 170 mL chemo infusion, 375 mg/m2 = 800 mg, Intravenous,  Once, 0 of 5 cycles  for chemotherapy treatment.         INTERVAL HISTORY: Please see below for problem oriented charting. She returns prior to cycle 2 of treatment. She had one episode of nausea and vomiting after chemotherapy, resolved with anti-emetics. She has objective clinical  improvement. Her snoring and nasal congestion had resolved. She denies mucositis, change in bowel habits or recent fever or chills Lymphadenopathy has resolved  REVIEW OF SYSTEMS:   Constitutional: Denies fevers, chills or abnormal weight loss Eyes: Denies blurriness of vision Ears, nose, mouth, throat, and face: Denies mucositis or sore throat Respiratory: Denies cough, dyspnea or wheezes Cardiovascular: Denies palpitation, chest discomfort or lower extremity swelling Skin: Denies abnormal skin rashes Lymphatics: Denies new lymphadenopathy or easy bruising Neurological:Denies numbness, tingling or new weaknesses Behavioral/Psych: Mood is stable, no new changes  All other systems were reviewed with the patient and are negative.  I have reviewed the past medical history, past surgical history, social history and family history with the patient and they are unchanged from previous note.  ALLERGIES:  is allergic to latex; penicillins; and sulfa antibiotics.  MEDICATIONS:  Current Outpatient Prescriptions  Medication Sig Dispense Refill  . allopurinol (ZYLOPRIM) 300 MG tablet Take 1 tablet (300 mg total) by mouth daily. 30 tablet 0  . Ascorbic Acid (VITAMIN C PO) Take 1 tablet by mouth daily.    . Cholecalciferol (VITAMIN D PO) Take 1 tablet by mouth daily.    Marland Kitchen lidocaine-prilocaine (EMLA) cream Apply to affected area once 30 g 3  . Multiple Vitamins-Minerals (MULTIVITAMIN WITH MINERALS) tablet Take 1 tablet by mouth daily.    . naproxen sodium (ANAPROX) 220 MG tablet Take 220 mg by mouth 2 (two) times daily as needed (for pain).    . ondansetron (ZOFRAN) 8 MG tablet Take 1 tablet (8 mg total) by mouth every 8 (eight) hours as needed for refractory nausea / vomiting. 30 tablet 1  . predniSONE (DELTASONE) 20 MG tablet Take 3 tablets (60 mg total) by mouth daily. Take on days 2-5 of chemotherapy. 36 tablet 0  . prochlorperazine (COMPAZINE) 10 MG tablet Take 1 tablet (10 mg total) by mouth  every 6 (six) hours as needed (Nausea or vomiting). 30 tablet 6   No current facility-administered medications for this visit.     PHYSICAL EXAMINATION: ECOG PERFORMANCE STATUS: 1 - Symptomatic but completely ambulatory  Vitals:   11/03/15 0823  BP: 130/74  Pulse: 68  Resp: 18  Temp: 97.9 F (36.6 C)   Filed Weights   11/03/15 0823  Weight: 211 lb 3.2 oz (95.8 kg)    GENERAL:alert, no distress and comfortable SKIN: skin color, texture, turgor are normal, no rashes or significant lesions EYES: normal, Conjunctiva are pink and non-injected, sclera clear OROPHARYNX:no exudate, no erythema and lips, buccal mucosa, and tongue normal  NECK: supple, thyroid normal size, non-tender, without nodularity LYMPH:  no palpable lymphadenopathy in the cervical, axillary or inguinal LUNGS: clear to auscultation and percussion with normal breathing effort HEART: regular rate & rhythm and no murmurs and no lower extremity edema ABDOMEN:abdomen soft, non-tender and normal bowel sounds Musculoskeletal:no cyanosis of digits and no clubbing  NEURO: alert & oriented x 3 with fluent speech, no focal motor/sensory deficits  LABORATORY DATA:  I have reviewed the data as listed    Component Value Date/Time   NA 140 10/09/2015 0900   K 3.6 10/09/2015 0900   CL 103 10/09/2015 0900   CO2 27 10/09/2015 0900  GLUCOSE 103 (H) 10/09/2015 0900   BUN 8 10/09/2015 0900   CREATININE 0.76 10/09/2015 0900   CALCIUM 9.2 10/09/2015 0900   PROT 7.7 10/09/2015 0900   ALBUMIN 3.7 10/09/2015 0900   AST 27 10/09/2015 0900   ALT 25 10/09/2015 0900   ALKPHOS 61 10/09/2015 0900   BILITOT 0.8 10/09/2015 0900   GFRNONAA >60 10/09/2015 0900   GFRAA >60 10/09/2015 0900    No results found for: SPEP, UPEP  Lab Results  Component Value Date   WBC 2.5 (L) 11/03/2015   NEUTROABS 1.0 (L) 11/03/2015   HGB 11.4 (L) 11/03/2015   HCT 34.6 (L) 11/03/2015   MCV 85.9 11/03/2015   PLT 279 11/03/2015      Chemistry       Component Value Date/Time   NA 140 10/09/2015 0900   K 3.6 10/09/2015 0900   CL 103 10/09/2015 0900   CO2 27 10/09/2015 0900   BUN 8 10/09/2015 0900   CREATININE 0.76 10/09/2015 0900      Component Value Date/Time   CALCIUM 9.2 10/09/2015 0900   ALKPHOS 61 10/09/2015 0900   AST 27 10/09/2015 0900   ALT 25 10/09/2015 0900   BILITOT 0.8 10/09/2015 0900      ASSESSMENT & PLAN:  Diffuse large B-cell lymphoma of lymph nodes of neck ([facility]) Overall, she tolerated treatment well apart from recent nausea and neutropenia. Her nausea has resolved. I recommend we proceed with treatment today without delay along with G-CSF support. I will reduce the dose of Asian mycin and Cytoxan by 20%. Clinically, she is responding to treatment  Drug-induced neutropenia [facility]) This is related to recent treatment. I will adjust the dose of treatment as above and I will proceed with treatment today without delay We discussed neutropenic precautions  Chemotherapy-induced nausea This is related to recent treatment. It resolves with anti-emetics. I will reduce the dose of chemotherapy as above   No orders of the defined types were placed in this encounter.  All questions were answered. The patient knows to call the clinic with any problems, questions or concerns. No barriers to learning was detected. I spent 20 minutes counseling the patient face to face. The total time spent in the appointment was 25 minutes and more than 50% was on counseling and review of test results     Heath Lark, MD 11/03/2015 8:43 AM

## 2015-11-03 NOTE — Assessment & Plan Note (Signed)
Overall, she tolerated treatment well apart from recent nausea and neutropenia. Her nausea has resolved. I recommend we proceed with treatment today without delay along with G-CSF support. I will reduce the dose of Asian mycin and Cytoxan by 20%. Clinically, she is responding to treatment

## 2015-11-03 NOTE — Patient Instructions (Signed)

## 2015-11-03 NOTE — Assessment & Plan Note (Signed)
This is related to recent treatment. I will adjust the dose of treatment as above and I will proceed with treatment today without delay We discussed neutropenic precautions

## 2015-11-03 NOTE — Progress Notes (Signed)
Blood return noted before, every 3 cc and after Adriamycin and before and after Vincristine.

## 2015-11-03 NOTE — Telephone Encounter (Signed)
Per LOS I have scheduled appts ans notified the scheuler

## 2015-11-05 ENCOUNTER — Ambulatory Visit: Payer: 59

## 2015-11-05 ENCOUNTER — Ambulatory Visit (HOSPITAL_BASED_OUTPATIENT_CLINIC_OR_DEPARTMENT_OTHER): Payer: 59 | Admitting: Nurse Practitioner

## 2015-11-05 ENCOUNTER — Other Ambulatory Visit: Payer: Self-pay | Admitting: Hematology and Oncology

## 2015-11-05 VITALS — BP 141/82 | HR 64 | Temp 98.6°F | Resp 18

## 2015-11-05 VITALS — BP 132/78 | HR 70 | Temp 98.4°F | Resp 18

## 2015-11-05 DIAGNOSIS — R110 Nausea: Secondary | ICD-10-CM

## 2015-11-05 DIAGNOSIS — C8331 Diffuse large B-cell lymphoma, lymph nodes of head, face, and neck: Secondary | ICD-10-CM | POA: Diagnosis not present

## 2015-11-05 DIAGNOSIS — T451X5A Adverse effect of antineoplastic and immunosuppressive drugs, initial encounter: Principal | ICD-10-CM

## 2015-11-05 DIAGNOSIS — D702 Other drug-induced agranulocytosis: Secondary | ICD-10-CM | POA: Diagnosis not present

## 2015-11-05 MED ORDER — PROMETHAZINE HCL 25 MG/ML IJ SOLN
25.0000 mg | Freq: Once | INTRAMUSCULAR | Status: AC
  Administered 2015-11-05: 25 mg via INTRAVENOUS
  Filled 2015-11-05: qty 1

## 2015-11-05 MED ORDER — SODIUM CHLORIDE 0.9 % IV SOLN
10.0000 mg | Freq: Once | INTRAVENOUS | Status: AC
  Administered 2015-11-05: 10 mg via INTRAVENOUS
  Filled 2015-11-05: qty 1

## 2015-11-05 MED ORDER — SODIUM CHLORIDE 0.9 % IV SOLN
Freq: Once | INTRAVENOUS | Status: AC
  Administered 2015-11-05: 11:00:00 via INTRAVENOUS

## 2015-11-05 MED ORDER — SODIUM CHLORIDE 0.9 % IJ SOLN
10.0000 mL | INTRAMUSCULAR | Status: AC | PRN
  Administered 2015-11-05: 10 mL
  Filled 2015-11-05: qty 10

## 2015-11-05 MED ORDER — PEGFILGRASTIM INJECTION 6 MG/0.6ML ~~LOC~~
6.0000 mg | PREFILLED_SYRINGE | Freq: Once | SUBCUTANEOUS | Status: AC
  Administered 2015-11-05: 6 mg via SUBCUTANEOUS
  Filled 2015-11-05: qty 0.6

## 2015-11-05 MED ORDER — HEPARIN SOD (PORK) LOCK FLUSH 100 UNIT/ML IV SOLN
500.0000 [IU] | INTRAVENOUS | Status: AC | PRN
  Administered 2015-11-05: 500 [IU]
  Filled 2015-11-05: qty 5

## 2015-11-05 NOTE — Progress Notes (Signed)
Pt c/o nausea since last night, vomited once this morning.  Has not been able to eat or drink anything today.  Took compazine at home around 6 am but doesn't feel like it is helping.  Notified Dr. Alvy Bimler.  Taken to [facility] for infusion/ IVFs and anti emetics. Pt states she has not taken any prednisone today.  Instructed pt to not take today,  Will give IV Dex as ordered.  Can take prednisone again tomorrow. She verbalized understanding.

## 2015-11-05 NOTE — Patient Instructions (Signed)

## 2015-11-06 ENCOUNTER — Encounter: Payer: Self-pay | Admitting: Nurse Practitioner

## 2015-11-06 NOTE — Progress Notes (Signed)
Nurse visit only

## 2015-11-17 MED FILL — ONDANSETRON HCL 8 MG TABLET: 8 | 3 days supply | Qty: 9 | Fill #1

## 2015-11-24 ENCOUNTER — Ambulatory Visit: Payer: 59

## 2015-11-24 ENCOUNTER — Ambulatory Visit (HOSPITAL_BASED_OUTPATIENT_CLINIC_OR_DEPARTMENT_OTHER): Payer: 59

## 2015-11-24 ENCOUNTER — Telehealth: Payer: Self-pay | Admitting: *Deleted

## 2015-11-24 ENCOUNTER — Other Ambulatory Visit (HOSPITAL_BASED_OUTPATIENT_CLINIC_OR_DEPARTMENT_OTHER): Payer: 59

## 2015-11-24 ENCOUNTER — Encounter: Payer: Self-pay | Admitting: Hematology and Oncology

## 2015-11-24 ENCOUNTER — Ambulatory Visit (HOSPITAL_BASED_OUTPATIENT_CLINIC_OR_DEPARTMENT_OTHER): Payer: 59 | Admitting: Hematology and Oncology

## 2015-11-24 ENCOUNTER — Telehealth: Payer: Self-pay | Admitting: Hematology and Oncology

## 2015-11-24 VITALS — BP 118/69 | HR 69 | Temp 98.8°F | Resp 18

## 2015-11-24 VITALS — BP 118/70 | HR 68 | Temp 98.3°F | Resp 18 | Ht 64.0 in | Wt 210.4 lb

## 2015-11-24 DIAGNOSIS — Z5111 Encounter for antineoplastic chemotherapy: Secondary | ICD-10-CM | POA: Diagnosis not present

## 2015-11-24 DIAGNOSIS — T451X5A Adverse effect of antineoplastic and immunosuppressive drugs, initial encounter: Principal | ICD-10-CM

## 2015-11-24 DIAGNOSIS — D702 Other drug-induced agranulocytosis: Secondary | ICD-10-CM

## 2015-11-24 DIAGNOSIS — Z5112 Encounter for antineoplastic immunotherapy: Secondary | ICD-10-CM | POA: Diagnosis not present

## 2015-11-24 DIAGNOSIS — R110 Nausea: Secondary | ICD-10-CM

## 2015-11-24 DIAGNOSIS — C8331 Diffuse large B-cell lymphoma, lymph nodes of head, face, and neck: Secondary | ICD-10-CM

## 2015-11-24 LAB — CBC WITH DIFFERENTIAL/PLATELET
BASO%: 0.8 % (ref 0.0–2.0)
Basophils Absolute: 0 10*3/uL (ref 0.0–0.1)
EOS%: 3.1 % (ref 0.0–7.0)
Eosinophils Absolute: 0.1 10*3/uL (ref 0.0–0.5)
HCT: 33.8 % — ABNORMAL LOW (ref 34.8–46.6)
HGB: 11 g/dL — ABNORMAL LOW (ref 11.6–15.9)
LYMPH#: 1 10*3/uL (ref 0.9–3.3)
LYMPH%: 38.7 % (ref 14.0–49.7)
MCH: 27.9 pg (ref 25.1–34.0)
MCHC: 32.5 g/dL (ref 31.5–36.0)
MCV: 85.8 fL (ref 79.5–101.0)
MONO#: 0.4 10*3/uL (ref 0.1–0.9)
MONO%: 13.8 % (ref 0.0–14.0)
NEUT%: 43.6 % (ref 38.4–76.8)
NEUTROS ABS: 1.1 10*3/uL — AB (ref 1.5–6.5)
PLATELETS: 243 10*3/uL (ref 145–400)
RBC: 3.94 10*6/uL (ref 3.70–5.45)
RDW: 15.1 % — ABNORMAL HIGH (ref 11.2–14.5)
WBC: 2.6 10*3/uL — AB (ref 3.9–10.3)

## 2015-11-24 LAB — COMPREHENSIVE METABOLIC PANEL
ALT: 28 U/L (ref 0–55)
ANION GAP: 9 meq/L (ref 3–11)
AST: 24 U/L (ref 5–34)
Albumin: 3.3 g/dL — ABNORMAL LOW (ref 3.5–5.0)
Alkaline Phosphatase: 70 U/L (ref 40–150)
BILIRUBIN TOTAL: 0.3 mg/dL (ref 0.20–1.20)
BUN: 17.6 mg/dL (ref 7.0–26.0)
CO2: 24 meq/L (ref 22–29)
CREATININE: 0.7 mg/dL (ref 0.6–1.1)
Calcium: 9.3 mg/dL (ref 8.4–10.4)
Chloride: 108 mEq/L (ref 98–109)
EGFR: >90 mL/min/{1.73_m2} (ref >90)
GLUCOSE: 124 mg/dL (ref 70–140)
Potassium: 3.8 mEq/L (ref 3.5–5.1)
Sodium: 140 mEq/L (ref 136–145)
TOTAL PROTEIN: 6.9 g/dL (ref 6.4–8.3)

## 2015-11-24 MED ORDER — SODIUM CHLORIDE 0.9% FLUSH
10.0000 mL | INTRAVENOUS | Status: DC | PRN
  Administered 2015-11-24: 10 mL
  Filled 2015-11-24: qty 10

## 2015-11-24 MED ORDER — HEPARIN SOD (PORK) LOCK FLUSH 100 UNIT/ML IV SOLN
500.0000 [IU] | Freq: Once | INTRAVENOUS | Status: AC | PRN
  Administered 2015-11-24: 500 [IU]
  Filled 2015-11-24: qty 5

## 2015-11-24 MED ORDER — PALONOSETRON HCL INJECTION 0.25 MG/5ML
0.2500 mg | Freq: Once | INTRAVENOUS | Status: AC
  Administered 2015-11-24: 0.25 mg via INTRAVENOUS

## 2015-11-24 MED ORDER — SODIUM CHLORIDE 0.9 % IV SOLN
600.0000 mg/m2 | Freq: Once | INTRAVENOUS | Status: AC
  Administered 2015-11-24: 1240 mg via INTRAVENOUS
  Filled 2015-11-24: qty 62

## 2015-11-24 MED ORDER — SODIUM CHLORIDE 0.9 % IV SOLN
Freq: Once | INTRAVENOUS | Status: AC
  Administered 2015-11-24: 09:00:00 via INTRAVENOUS
  Filled 2015-11-24: qty 5

## 2015-11-24 MED ORDER — DOXORUBICIN HCL CHEMO IV INJECTION 2 MG/ML
40.0000 mg/m2 | Freq: Once | INTRAVENOUS | Status: AC
  Administered 2015-11-24: 84 mg via INTRAVENOUS
  Filled 2015-11-24: qty 42

## 2015-11-24 MED ORDER — VINCRISTINE SULFATE CHEMO INJECTION 1 MG/ML
2.0000 mg | Freq: Once | INTRAVENOUS | Status: AC
  Administered 2015-11-24: 2 mg via INTRAVENOUS
  Filled 2015-11-24: qty 2

## 2015-11-24 MED ORDER — SODIUM CHLORIDE 0.9 % IV SOLN
Freq: Once | INTRAVENOUS | Status: AC
  Administered 2015-11-24: 09:00:00 via INTRAVENOUS

## 2015-11-24 MED ORDER — SODIUM CHLORIDE 0.9 % IJ SOLN
10.0000 mL | INTRAMUSCULAR | Status: AC | PRN
  Administered 2015-11-24: 10 mL
  Filled 2015-11-24: qty 10

## 2015-11-24 MED ORDER — DIPHENHYDRAMINE HCL 25 MG PO CAPS
ORAL_CAPSULE | ORAL | Status: AC
  Filled 2015-11-24: qty 2

## 2015-11-24 MED ORDER — DIPHENHYDRAMINE HCL 25 MG PO CAPS
50.0000 mg | ORAL_CAPSULE | Freq: Once | ORAL | Status: AC
  Administered 2015-11-24: 50 mg via ORAL

## 2015-11-24 MED ORDER — SODIUM CHLORIDE 0.9 % IV SOLN
375.0000 mg/m2 | Freq: Once | INTRAVENOUS | Status: AC
  Administered 2015-11-24: 800 mg via INTRAVENOUS
  Filled 2015-11-24: qty 50

## 2015-11-24 MED ORDER — ACETAMINOPHEN 325 MG PO TABS
ORAL_TABLET | ORAL | Status: AC
  Filled 2015-11-24: qty 2

## 2015-11-24 MED ORDER — PALONOSETRON HCL INJECTION 0.25 MG/5ML
INTRAVENOUS | Status: AC
  Filled 2015-11-24: qty 5

## 2015-11-24 MED ORDER — ACETAMINOPHEN 325 MG PO TABS
650.0000 mg | ORAL_TABLET | Freq: Once | ORAL | Status: AC
  Administered 2015-11-24: 650 mg via ORAL

## 2015-11-24 NOTE — Telephone Encounter (Signed)
Per LOS I have scheduled appts and notified the scheudler

## 2015-11-24 NOTE — Assessment & Plan Note (Signed)
Overall, she tolerated treatment well apart from recent nausea and neutropenia. Her nausea has resolved. I recommend we proceed with treatment today without delay along with G-CSF support. I will reduce the dose of Adriamycin and Cytoxan by 20%. Clinically, she is responding to treatment Will order repeat PET scan prior to return appointment

## 2015-11-24 NOTE — Progress Notes (Signed)
[place] OFFICE PROGRESS NOTE  Patient Care Team: Haywood Pao, MD as PCP - General (Internal Medicine) Heath Lark, MD as Consulting Physician (Hematology and Oncology) Jodi Marble, MD as Consulting Physician (Otolaryngology)  SUMMARY OF ONCOLOGIC HISTORY:   Diffuse large B-cell lymphoma of lymph nodes of neck ([place])   09/24/2015 Pathology Results    Final Cytologic Interpretation [id number]: Right level II neck mass, Fine Needle Aspiration I (smears and Thinprep): Malignant cells present, most consistent with large cell lymphoma.       09/24/2015 Pathology Results    [phone] The nasopharyngeal biopsy shows lymphoid tissue with diffuse effacement of the normal nodal architecture by large cells with vesicular nuclei, prominent nucleoli, and scant cytoplasm. The large cells are positive for CD20, BCL-6, BCL-2, CD5, and MUM1 by immunohistochemistry. They are negative for CD30, CD3, CD10, cyclinD1 and SOX11. In situ hybridization for EBV mRNA is negative. Ki-67 reveals a proliferation rate of approximately 60%. By flow cytometry, the atypical cells express CD19, CD5, and CD23 (see below). The expression of CD5 and CD23 by flow cytometry is suggestive of small lymphocytic lymphoma/ chronic lymphocytic leukemia (SLL/CLL) transforming to a diffuse large B-cell lymphoma (DLBCL); however, there was no evidence of SLL/CLL in the flow sample. Correlation with peripheral blood/ marrow findings would be useful to determine if this lymphoma arose from background SLL/CLL or is a de novo CD5+ DLBCL. De novo CD5+ DLBCL is frequently associated with an aggressive clinical course and poor response to chemotherapy. A pleomorphic mantle cell lymphoma is less likely since the cells are negative for Cyclin D1 and SOX11. FISH studies are pending and will be reported as an addendum.       09/30/2015 Imaging    CT scan of neck showed bulky RIGHT neck lymphadenopathy concerning for metastatic  disease. 8 mm submucosal RIGHT base of tongue mass, considering ipsilateral lymphadenopathy, this is suspicious for primary head and neck cancer. Isodense LEFT palatine tonsillar mass versus focal lymphoid hyperplasia resulting in LEFT postobstructive middle ear/mastoid effusion.       10/03/2015 Imaging    ECHO showed normal EF      10/08/2015 PET scan    Hypermetabolic left nasopharyngeal and tongue base masses with hypermetabolic bilateral neck adenopathy. Right level 2 adenopathy extends inferiorly into the low right internal jugular station. No additional evidence of metastatic disease in the chest, abdomen or pelvis.      10/09/2015 Procedure    Successful placement of a left internal jugular approach power injectable Port-A-Cath. The catheter is ready for immediate use.      10/13/2015 -  Chemotherapy    The patient had DOXOrubicin (ADRIAMYCIN) chemo injection 104 mg, 50 mg/m2 = 104 mg, Intravenous,  Once, 1 of 6 cycles  palonosetron (ALOXI) injection 0.25 mg, 0.25 mg, Intravenous,  Once, 1 of 6 cycles  pegfilgrastim (NEULASTA) injection 6 mg, 6 mg, Subcutaneous,  Once, 1 of 6 cycles  vinCRIStine (ONCOVIN) 2 mg in sodium chloride 0.9 % 50 mL chemo infusion, 2 mg, Intravenous,  Once, 1 of 6 cycles  riTUXimab (RITUXAN) 800 mg in sodium chloride 0.9 % 250 mL (2.4242 mg/mL) chemo infusion, 375 mg/m2 = 800 mg, Intravenous,  Once, 1 of 1 cycle  cyclophosphamide (CYTOXAN) 1,560 mg in sodium chloride 0.9 % 250 mL chemo infusion, 750 mg/m2 = 1,560 mg, Intravenous,  Once, 1 of 6 cycles  riTUXimab (RITUXAN) 800 mg in sodium chloride 0.9 % 170 mL chemo infusion, 375 mg/m2 = 800 mg, Intravenous,  Once, 0 of 5 cycles  for chemotherapy treatment.         INTERVAL HISTORY: Please see below for problem oriented charting. She is seen prior to cycle 3 of treatment. She had one episode of nausea, resolved. With dose reduced treatment, she tolerated that better. Denies peripheral  neuropathy. No recent fever or chills.  REVIEW OF SYSTEMS:   Constitutional: Denies fevers, chills or abnormal weight loss Eyes: Denies blurriness of vision Ears, nose, mouth, throat, and face: Denies mucositis or sore throat Respiratory: Denies cough, dyspnea or wheezes Cardiovascular: Denies palpitation, chest discomfort or lower extremity swelling Gastrointestinal:  Denies nausea, heartburn or change in bowel habits Skin: Denies abnormal skin rashes Lymphatics: Denies new lymphadenopathy or easy bruising Neurological:Denies numbness, tingling or new weaknesses Behavioral/Psych: Mood is stable, no new changes  All other systems were reviewed with the patient and are negative.  I have reviewed the past medical history, past surgical history, social history and family history with the patient and they are unchanged from previous note.  ALLERGIES:  is allergic to latex; penicillins; and sulfa antibiotics.  MEDICATIONS:  Current Outpatient Prescriptions  Medication Sig Dispense Refill  . allopurinol (ZYLOPRIM) 300 MG tablet Take 1 tablet (300 mg total) by mouth daily. 30 tablet 0  . Ascorbic Acid (VITAMIN C PO) Take 1 tablet by mouth daily.    . Cholecalciferol (VITAMIN D PO) Take 1 tablet by mouth daily.    Marland Kitchen lidocaine-prilocaine (EMLA) cream Apply to affected area once 30 g 3  . Multiple Vitamins-Minerals (MULTIVITAMIN WITH MINERALS) tablet Take 1 tablet by mouth daily.    . naproxen sodium (ANAPROX) 220 MG tablet Take 220 mg by mouth 2 (two) times daily as needed (for pain).    . ondansetron (ZOFRAN) 8 MG tablet Take 1 tablet (8 mg total) by mouth every 8 (eight) hours as needed for refractory nausea / vomiting. 30 tablet 1  . predniSONE (DELTASONE) 20 MG tablet Take 3 tablets (60 mg total) by mouth daily. Take on days 2-5 of chemotherapy. 36 tablet 0  . prochlorperazine (COMPAZINE) 10 MG tablet Take 1 tablet (10 mg total) by mouth every 6 (six) hours as needed (Nausea or vomiting).  30 tablet 6   No current facility-administered medications for this visit.    Facility-Administered Medications Ordered in Other Visits  Medication Dose Route Frequency Provider Last Rate Last Dose  . 0.9 %  sodium chloride infusion   Intravenous Once Heath Lark, MD      . acetaminophen (TYLENOL) tablet 650 mg  650 mg Oral Once Heath Lark, MD      . cyclophosphamide (CYTOXAN) 1,240 mg in sodium chloride 0.9 % 250 mL chemo infusion  600 mg/m2 (Order-Specific) Intravenous Once Heath Lark, MD      . diphenhydrAMINE (BENADRYL) capsule 50 mg  50 mg Oral Once Heath Lark, MD      . DOXOrubicin (ADRIAMYCIN) chemo injection 84 mg  40 mg/m2 (Order-Specific) Intravenous Once Heath Lark, MD      . fosaprepitant (EMEND) 150 mg, dexamethasone (DECADRON) 12 mg in sodium chloride 0.9 % 145 mL IVPB   Intravenous Once Heath Lark, MD      . heparin lock flush 100 unit/mL  500 Units Intracatheter Once PRN Heath Lark, MD      . palonosetron (ALOXI) injection 0.25 mg  0.25 mg Intravenous Once Heath Lark, MD      . riTUXimab (RITUXAN) 800 mg in sodium chloride 0.9 % 170 mL chemo infusion  375  mg/m2 (Order-Specific) Intravenous Once Heath Lark, MD      . sodium chloride flush (NS) 0.9 % injection 10 mL  10 mL Intracatheter PRN Heath Lark, MD      . vinCRIStine (ONCOVIN) 2 mg in sodium chloride 0.9 % 50 mL chemo infusion  2 mg Intravenous Once Heath Lark, MD        PHYSICAL EXAMINATION: ECOG PERFORMANCE STATUS: 1 - Symptomatic but completely ambulatory  Vitals:   11/24/15 0820  BP: 118/70  Pulse: 68  Resp: 18  Temp: 98.3 F (36.8 C)   Filed Weights   11/24/15 0820  Weight: 210 lb 6.4 oz (95.4 kg)    GENERAL:alert, no distress and comfortable SKIN: skin color, texture, turgor are normal, no rashes or significant lesions EYES: normal, Conjunctiva are pink and non-injected, sclera clear OROPHARYNX:no exudate, no erythema and lips, buccal mucosa, and tongue normal  NECK: supple, thyroid normal size,  non-tender, without nodularity LYMPH:  no palpable lymphadenopathy in the cervical, axillary or inguinal LUNGS: clear to auscultation and percussion with normal breathing effort HEART: regular rate & rhythm and no murmurs and no lower extremity edema ABDOMEN:abdomen soft, non-tender and normal bowel sounds Musculoskeletal:no cyanosis of digits and no clubbing  NEURO: alert & oriented x 3 with fluent speech, no focal motor/sensory deficits  LABORATORY DATA:  I have reviewed the data as listed    Component Value Date/Time   NA 140 11/24/2015 0748   K 3.8 11/24/2015 0748   CL 103 10/09/2015 0900   CO2 24 11/24/2015 0748   GLUCOSE 124 11/24/2015 0748   BUN 17.6 11/24/2015 0748   CREATININE 0.7 11/24/2015 0748   CALCIUM 9.3 11/24/2015 0748   PROT 6.9 11/24/2015 0748   ALBUMIN 3.3 (L) 11/24/2015 0748   AST 24 11/24/2015 0748   ALT 28 11/24/2015 0748   ALKPHOS 70 11/24/2015 0748   BILITOT 0.30 11/24/2015 0748   GFRNONAA >60 10/09/2015 0900   GFRAA >60 10/09/2015 0900    No results found for: SPEP, UPEP  Lab Results  Component Value Date   WBC 2.6 (L) 11/24/2015   NEUTROABS 1.1 (L) 11/24/2015   HGB 11.0 (L) 11/24/2015   HCT 33.8 (L) 11/24/2015   MCV 85.8 11/24/2015   PLT 243 11/24/2015      Chemistry      Component Value Date/Time   NA 140 11/24/2015 0748   K 3.8 11/24/2015 0748   CL 103 10/09/2015 0900   CO2 24 11/24/2015 0748   BUN 17.6 11/24/2015 0748   CREATININE 0.7 11/24/2015 0748      Component Value Date/Time   CALCIUM 9.3 11/24/2015 0748   ALKPHOS 70 11/24/2015 0748   AST 24 11/24/2015 0748   ALT 28 11/24/2015 0748   BILITOT 0.30 11/24/2015 0748      ASSESSMENT & PLAN:  Diffuse large B-cell lymphoma of lymph nodes of neck ([facility]) Overall, she tolerated treatment well apart from recent nausea and neutropenia. Her nausea has resolved. I recommend we proceed with treatment today without delay along with G-CSF support. I will reduce the dose of Adriamycin  and Cytoxan by 20%. Clinically, she is responding to treatment Will order repeat PET scan prior to return appointment  Chemotherapy-induced nausea This is related to recent treatment. It resolves with anti-emetics. I will reduce the dose of chemotherapy as above  Drug-induced neutropenia ([place]) This is related to recent treatment. I will adjust the dose of treatment as above and I will proceed with treatment today without delay  We discussed neutropenic precautions   Orders Placed This Encounter  Procedures  . NM PET Image Restag (PS) Skull Base To Thigh    Standing Status:   Future    Standing Expiration Date:   12/28/2016    Order Specific Question:   Reason for exam:    Answer:   staging lymphoma assess response to Rx    Order Specific Question:   Preferred imaging location?    Answer:   [facility]   All questions were answered. The patient knows to call the clinic with any problems, questions or concerns. No barriers to learning was detected. I spent 20 minutes counseling the patient face to face. The total time spent in the appointment was 25 minutes and more than 50% was on counseling and review of test results     Heath Lark, MD 11/24/2015 9:19 AM

## 2015-11-24 NOTE — Assessment & Plan Note (Signed)
This is related to recent treatment. It resolves with anti-emetics. I will reduce the dose of chemotherapy as above

## 2015-11-24 NOTE — Assessment & Plan Note (Signed)
This is related to recent treatment. I will adjust the dose of treatment as above and I will proceed with treatment today without delay We discussed neutropenic precautions

## 2015-11-24 NOTE — Telephone Encounter (Signed)
Message sent to chemo scheduler to add chemo. Lab, Flush and follow up visit scheduled per 11/24/15 los. AVS report and appointment schedule given to patient, per 11/24/15 los.

## 2015-11-24 NOTE — Patient Instructions (Signed)

## 2015-11-24 NOTE — Patient Instructions (Signed)
[facility] Discharge Instructions for Patients Receiving Chemotherapy  Today you received the following chemotherapy agents:  Adriamycin, Vincristine, Cytoxan, Rituxan. To help prevent nausea and vomiting after your treatment, we encourage you to take your nausea medication as prescribed.  If you develop nausea and vomiting that is not controlled by your nausea medication, call the clinic.   BELOW ARE SYMPTOMS THAT SHOULD BE REPORTED IMMEDIATELY:  *FEVER GREATER THAN 100.5 F  *CHILLS WITH OR WITHOUT FEVER  NAUSEA AND VOMITING THAT IS NOT CONTROLLED WITH YOUR NAUSEA MEDICATION  *UNUSUAL SHORTNESS OF BREATH  *UNUSUAL BRUISING OR BLEEDING  TENDERNESS IN MOUTH AND THROAT WITH OR WITHOUT PRESENCE OF ULCERS  *URINARY PROBLEMS  *BOWEL PROBLEMS  UNUSUAL RASH Items with * indicate a potential emergency and should be followed up as soon as possible.  Feel free to call the clinic you have any questions or concerns. The clinic phone number is [phone].  Please show the CHEMO ALERT CARD at check-in to the Emergency Department and triage nurse.     

## 2015-11-26 ENCOUNTER — Ambulatory Visit (HOSPITAL_BASED_OUTPATIENT_CLINIC_OR_DEPARTMENT_OTHER): Payer: 59

## 2015-11-26 VITALS — BP 124/71 | HR 69 | Temp 98.2°F | Resp 18

## 2015-11-26 DIAGNOSIS — C8331 Diffuse large B-cell lymphoma, lymph nodes of head, face, and neck: Secondary | ICD-10-CM | POA: Diagnosis not present

## 2015-11-26 DIAGNOSIS — D702 Other drug-induced agranulocytosis: Secondary | ICD-10-CM

## 2015-11-26 MED ORDER — PEGFILGRASTIM INJECTION 6 MG/0.6ML ~~LOC~~
6.0000 mg | PREFILLED_SYRINGE | Freq: Once | SUBCUTANEOUS | Status: AC
  Administered 2015-11-26: 6 mg via SUBCUTANEOUS
  Filled 2015-11-26: qty 0.6

## 2015-11-28 ENCOUNTER — Telehealth: Payer: Self-pay | Admitting: *Deleted

## 2015-11-28 ENCOUNTER — Other Ambulatory Visit: Payer: Self-pay | Admitting: *Deleted

## 2015-11-28 ENCOUNTER — Ambulatory Visit (HOSPITAL_BASED_OUTPATIENT_CLINIC_OR_DEPARTMENT_OTHER): Payer: 59

## 2015-11-28 VITALS — BP 163/86 | HR 80 | Temp 99.4°F | Resp 18

## 2015-11-28 DIAGNOSIS — R110 Nausea: Secondary | ICD-10-CM

## 2015-11-28 DIAGNOSIS — C8331 Diffuse large B-cell lymphoma, lymph nodes of head, face, and neck: Secondary | ICD-10-CM | POA: Diagnosis not present

## 2015-11-28 DIAGNOSIS — T451X5A Adverse effect of antineoplastic and immunosuppressive drugs, initial encounter: Principal | ICD-10-CM

## 2015-11-28 MED ORDER — HEPARIN SOD (PORK) LOCK FLUSH 100 UNIT/ML IV SOLN
500.0000 [IU] | INTRAVENOUS | Status: AC | PRN
  Administered 2015-11-28: 500 [IU]
  Filled 2015-11-28: qty 5

## 2015-11-28 MED ORDER — SODIUM CHLORIDE 0.9 % IV SOLN
Freq: Once | INTRAVENOUS | Status: AC
  Administered 2015-11-28: 14:00:00 via INTRAVENOUS

## 2015-11-28 MED ORDER — ONDANSETRON HCL 4 MG/2ML IJ SOLN
8.0000 mg | Freq: Once | INTRAMUSCULAR | Status: AC
  Administered 2015-11-28: 8 mg via INTRAVENOUS

## 2015-11-28 MED ORDER — SODIUM CHLORIDE 0.9 % IV SOLN: 8.0000 mg | Freq: Once | INTRAVENOUS | Status: DC

## 2015-11-28 MED ORDER — ONDANSETRON HCL 4 MG/2ML IJ SOLN
INTRAMUSCULAR | Status: AC
  Filled 2015-11-28: qty 4

## 2015-11-28 MED ORDER — SODIUM CHLORIDE 0.9 % IJ SOLN
10.0000 mL | INTRAMUSCULAR | Status: AC | PRN
  Administered 2015-11-28: 10 mL
  Filled 2015-11-28: qty 10

## 2015-11-28 MED ORDER — PROMETHAZINE HCL 25 MG/ML IJ SOLN
25.0000 mg | Freq: Once | INTRAMUSCULAR | Status: AC
  Administered 2015-11-28: 25 mg via INTRAVENOUS
  Filled 2015-11-28: qty 1

## 2015-11-28 MED ORDER — ONDANSETRON HCL 4 MG/2ML IJ SOLN: 8.0000 mg | Freq: Once | INTRAMUSCULAR | Status: DC

## 2015-11-28 NOTE — Patient Instructions (Signed)

## 2015-11-28 NOTE — Telephone Encounter (Signed)
Husband called, states patient has not been feeling well since chemo. Not eating and drinking much. Denies fever or vomiting. Will bring in for IVF today a@ 1330 and Saturday @ 0800.

## 2015-11-29 ENCOUNTER — Ambulatory Visit (HOSPITAL_BASED_OUTPATIENT_CLINIC_OR_DEPARTMENT_OTHER): Payer: 59

## 2015-11-29 VITALS — BP 100/65 | HR 80 | Temp 98.4°F | Resp 18

## 2015-11-29 DIAGNOSIS — T451X5A Adverse effect of antineoplastic and immunosuppressive drugs, initial encounter: Secondary | ICD-10-CM

## 2015-11-29 DIAGNOSIS — R110 Nausea: Secondary | ICD-10-CM | POA: Diagnosis not present

## 2015-11-29 DIAGNOSIS — C8331 Diffuse large B-cell lymphoma, lymph nodes of head, face, and neck: Secondary | ICD-10-CM | POA: Diagnosis not present

## 2015-11-29 MED ORDER — HEPARIN SOD (PORK) LOCK FLUSH 100 UNIT/ML IV SOLN
500.0000 [IU] | Freq: Once | INTRAVENOUS | Status: AC
  Administered 2015-11-29: 500 [IU] via INTRAVENOUS
  Filled 2015-11-29: qty 5

## 2015-11-29 MED ORDER — ONDANSETRON HCL 4 MG/2ML IJ SOLN
INTRAMUSCULAR | Status: AC
  Filled 2015-11-29: qty 4

## 2015-11-29 MED ORDER — SODIUM CHLORIDE 0.9 % IV SOLN
8.0000 mg | Freq: Once | INTRAVENOUS | Status: AC
  Administered 2015-11-29: 8 mg via INTRAVENOUS

## 2015-11-29 MED ORDER — SODIUM CHLORIDE 0.9 % IV SOLN
INTRAVENOUS | Status: DC
  Administered 2015-11-29: 08:00:00 via INTRAVENOUS

## 2015-11-29 MED ORDER — SODIUM CHLORIDE 0.9% FLUSH
10.0000 mL | INTRAVENOUS | Status: DC | PRN
  Administered 2015-11-29: 10 mL via INTRAVENOUS
  Filled 2015-11-29: qty 10

## 2015-11-29 NOTE — Patient Instructions (Addendum)

## 2015-12-02 ENCOUNTER — Telehealth: Payer: Self-pay | Admitting: *Deleted

## 2015-12-02 NOTE — Telephone Encounter (Signed)
Pt requesting to delay all appts from 11/20 to the following week due to Thanksgiving. Does not want to fell bad during holiday.    Pending PET 11/17.Marland KitchenMarland Kitchen

## 2015-12-02 NOTE — Telephone Encounter (Signed)
I would recommend she keeps her return appt to see me on 11/20 and PET You may tell Sharyn Lull to cancel her infusion Rx on 11/20

## 2015-12-02 NOTE — Telephone Encounter (Signed)
[place] with Dr Charlotte Davidson to cancel treatment on 11/20. To keep appt for lab and MD. Pt verbalized understanding  PET pending

## 2015-12-15 ENCOUNTER — Other Ambulatory Visit (HOSPITAL_BASED_OUTPATIENT_CLINIC_OR_DEPARTMENT_OTHER): Payer: 59

## 2015-12-15 ENCOUNTER — Ambulatory Visit: Payer: 59

## 2015-12-15 ENCOUNTER — Encounter: Payer: Self-pay | Admitting: Hematology and Oncology

## 2015-12-15 ENCOUNTER — Telehealth: Payer: Self-pay | Admitting: *Deleted

## 2015-12-15 ENCOUNTER — Ambulatory Visit (HOSPITAL_COMMUNITY)
Admission: RE | Admit: 2015-12-15 | Discharge: 2015-12-15 | Disposition: A | Discharge status: Home / Self Care | Payer: 59 | Source: Ambulatory Visit | Attending: Hematology and Oncology | Admitting: Hematology and Oncology

## 2015-12-15 ENCOUNTER — Ambulatory Visit (HOSPITAL_BASED_OUTPATIENT_CLINIC_OR_DEPARTMENT_OTHER): Payer: 59 | Admitting: Hematology and Oncology

## 2015-12-15 VITALS — BP 127/68 | HR 67 | Temp 97.9°F | Resp 16 | Ht 64.0 in | Wt 209.3 lb

## 2015-12-15 DIAGNOSIS — C8331 Diffuse large B-cell lymphoma, lymph nodes of head, face, and neck: Secondary | ICD-10-CM | POA: Insufficient documentation

## 2015-12-15 DIAGNOSIS — D702 Other drug-induced agranulocytosis: Secondary | ICD-10-CM | POA: Diagnosis not present

## 2015-12-15 DIAGNOSIS — R110 Nausea: Secondary | ICD-10-CM

## 2015-12-15 DIAGNOSIS — T451X5A Adverse effect of antineoplastic and immunosuppressive drugs, initial encounter: Principal | ICD-10-CM

## 2015-12-15 LAB — CBC WITH DIFFERENTIAL/PLATELET
BASO%: 0.7 % (ref 0.0–2.0)
BASOS ABS: 0 10*3/uL (ref 0.0–0.1)
EOS ABS: 0.1 10*3/uL (ref 0.0–0.5)
EOS%: 2.3 % (ref 0.0–7.0)
HEMATOCRIT: 33.6 % — AB (ref 34.8–46.6)
HEMOGLOBIN: 11 g/dL — AB (ref 11.6–15.9)
LYMPH#: 0.9 10*3/uL (ref 0.9–3.3)
LYMPH%: 35.2 % (ref 14.0–49.7)
MCH: 28.7 pg (ref 25.1–34.0)
MCHC: 32.6 g/dL (ref 31.5–36.0)
MCV: 88.1 fL (ref 79.5–101.0)
MONO#: 0.6 10*3/uL (ref 0.1–0.9)
MONO%: 24.9 % — ABNORMAL HIGH (ref 0.0–14.0)
NEUT#: 0.9 10*3/uL — ABNORMAL LOW (ref 1.5–6.5)
NEUT%: 36.9 % — ABNORMAL LOW (ref 38.4–76.8)
Platelets: 311 10*3/uL (ref 145–400)
RBC: 3.81 10*6/uL (ref 3.70–5.45)
RDW: 17.3 % — AB (ref 11.2–14.5)
WBC: 2.6 10*3/uL — ABNORMAL LOW (ref 3.9–10.3)

## 2015-12-15 LAB — COMPREHENSIVE METABOLIC PANEL
ALBUMIN: 3.3 g/dL — AB (ref 3.5–5.0)
ALK PHOS: 67 U/L (ref 40–150)
ALT: 25 U/L (ref 0–55)
AST: 21 U/L (ref 5–34)
Anion Gap: 7 mEq/L (ref 3–11)
BUN: 16 mg/dL (ref 7.0–26.0)
CALCIUM: 9 mg/dL (ref 8.4–10.4)
CO2: 25 mEq/L (ref 22–29)
Chloride: 109 mEq/L (ref 98–109)
Creatinine: 0.7 mg/dL (ref 0.6–1.1)
Glucose: 111 mg/dl (ref 70–140)
POTASSIUM: 3.9 meq/L (ref 3.5–5.1)
Sodium: 141 mEq/L (ref 136–145)
Total Bilirubin: 0.25 mg/dL (ref 0.20–1.20)
Total Protein: 6.8 g/dL (ref 6.4–8.3)

## 2015-12-15 LAB — GLUCOSE, CAPILLARY: Glucose-Capillary: 117 mg/dL — ABNORMAL HIGH (ref 65–99)

## 2015-12-15 MED ORDER — HEPARIN SOD (PORK) LOCK FLUSH 100 UNIT/ML IV SOLN
500.0000 [IU] | INTRAVENOUS | Status: AC | PRN
  Administered 2015-12-15: 500 [IU]
  Filled 2015-12-15: qty 5

## 2015-12-15 MED ORDER — FLUDEOXYGLUCOSE F - 18 (FDG) INJECTION
9.2000 | Freq: Once | INTRAVENOUS | Status: AC | PRN
  Administered 2015-12-15: 9.2 via INTRAVENOUS

## 2015-12-15 MED ORDER — SODIUM CHLORIDE 0.9 % IJ SOLN
10.0000 mL | INTRAMUSCULAR | Status: AC | PRN
  Administered 2015-12-15: 10 mL
  Filled 2015-12-15: qty 10

## 2015-12-15 NOTE — Progress Notes (Addendum)
Lymphoma Location(s) / Histology:   09/24/2015 Pathology Results    Final Cytologic Interpretation [id number]: Right level II neck mass, Fine Needle Aspiration I (smears and Thinprep): Malignant cells present, most consistent with large cell [place] presented months ago with symptoms of: On 10/01/15 she presented to Dr. Alvy Bimler with a 3 week history of congestion with mild hearing problem over the left ear, diffuse swelling over the right neck and some nasal drainage.   Biopsies of Right level II neck mass revealed: Large Cell Lymphoma  Past/Anticipated interventions by medical oncology, if any:  Dr. Alvy Bimler documents 12/15/15 I will refer her to radiation oncologist to discuss consolidation treatment with radiation therapy. Plan to see her back next month during her radiation treatment for supportive care visits  She has received 3 Cycles of [facility] Chemotherapy 10/13/15- 11/24/15.   Weight changes, if any, over the past 6 months: loss 10 lbs   Recurrent fevers, or drenching night sweats, if any: has hot flashes from menopause  SAFETY ISSUES:NO  Prior radiation? No  Pacemaker/ICD? NO  Possible current pregnancy?  NO  Is the patient on methotrexate? NO  Current Complaints / other details:    09/30/2015 Imaging    CT scan of neck showed bulky RIGHT neck lymphadenopathy concerning for metastatic disease. 8 mm submucosal RIGHT base of tongue mass, considering ipsilateral lymphadenopathy, this is suspicious for primary head and neck cancer. Isodense LEFT palatine tonsillar mass versus focal lymphoid hyperplasia resulting in LEFT postobstructive middle ear/mastoid effusion.   12/15/2015 PET scan     Interval complete resolution of hypermetabolic activity associated with the left tonsillar mass and multiple cervical lymph nodes consistent with complete remission. No residual hypermetabolic activity demonstrated.    Allergies:PCN, Latex  Married, 2 children, Has  mouth ores using baking soad rinses  Maternal aunt lung cancer, smoker, father believes lung cancer, Mother gyn cancer, all deceased BP 111/75 (BP Location: Right Arm, Patient Position: Sitting, Cuff Size: Normal)   Pulse 75   Temp 98.2 F (36.8 C) (Oral)   Resp 20   Ht 5\' 4"  (1.626 m)   Wt 207 lb 3.2 oz (94 kg)   LMP 10/09/2011 (Approximate)   SpO2 100% Comment: room air  BMI 35.57 kg/m   Wt Readings from Last 3 Encounters:  12/16/15 207 lb 3.2 oz (94 kg)  12/15/15 209 lb 4.8 oz (94.9 kg)  11/24/15 210 lb 6.4 oz (95.4 kg)

## 2015-12-15 NOTE — Assessment & Plan Note (Signed)
Overall, she tolerated treatment well apart from recent nausea and neutropenia. Her nausea has resolved. Clinically, she is responding to treatment PET/CT scans showed complete response to treatment. I reviewed the current guidelines. I will refer her to radiation oncologist to discuss consolidation treatment with radiation therapy. Plan to see her back next month during her radiation treatment for supportive care visits

## 2015-12-15 NOTE — Telephone Encounter (Signed)
Oncology Nurse Navigator Documentation  Placed introductory call to new referral patient.  Introduced myself as the H&N oncology nurse navigator that works with Dr. Isidore Moos to whom she has been referred by Dr.Gorsuch.  She confirmed her understanding of referral and tomorrow's 0730 NE and 0800 Dr. Isidore Moos appts.  She indicated hes husband will be accompanying her.  I briefly explained my role as her navigator, indicated that I would be joining her tomorrow.  I confirmed her understanding of [place] location, explained arrival and RadOnc registration process.  She verbalized understanding of information provided, expressed appreciation for my call.  Gayleen Orem, RN, BSN, [place] at [place] [phone]

## 2015-12-15 NOTE — Progress Notes (Signed)
[facility] OFFICE PROGRESS NOTE  Patient Care Team: Haywood Pao, MD as PCP - General (Internal Medicine) Heath Lark, MD as Consulting Physician (Hematology and Oncology) Jodi Marble, MD as Consulting Physician (Otolaryngology)  SUMMARY OF ONCOLOGIC HISTORY:   Diffuse large B-cell lymphoma of lymph nodes of neck ([place])   09/24/2015 Pathology Results    Final Cytologic Interpretation [id number]: Right level II neck mass, Fine Needle Aspiration I (smears and Thinprep): Malignant cells present, most consistent with large cell lymphoma.       09/24/2015 Pathology Results    [phone] The nasopharyngeal biopsy shows lymphoid tissue with diffuse effacement of the normal nodal architecture by large cells with vesicular nuclei, prominent nucleoli, and scant cytoplasm. The large cells are positive for CD20, BCL-6, BCL-2, CD5, and MUM1 by immunohistochemistry. They are negative for CD30, CD3, CD10, cyclinD1 and SOX11. In situ hybridization for EBV mRNA is negative. Ki-67 reveals a proliferation rate of approximately 60%. By flow cytometry, the atypical cells express CD19, CD5, and CD23 (see below). The expression of CD5 and CD23 by flow cytometry is suggestive of small lymphocytic lymphoma/ chronic lymphocytic leukemia (SLL/CLL) transforming to a diffuse large B-cell lymphoma (DLBCL); however, there was no evidence of SLL/CLL in the flow sample. Correlation with peripheral blood/ marrow findings would be useful to determine if this lymphoma arose from background SLL/CLL or is a de novo CD5+ DLBCL. De novo CD5+ DLBCL is frequently associated with an aggressive clinical course and poor response to chemotherapy. A pleomorphic mantle cell lymphoma is less likely since the cells are negative for Cyclin D1 and SOX11. FISH studies are pending and will be reported as an addendum.       09/30/2015 Imaging    CT scan of neck showed bulky RIGHT neck lymphadenopathy concerning for metastatic  disease. 8 mm submucosal RIGHT base of tongue mass, considering ipsilateral lymphadenopathy, this is suspicious for primary head and neck cancer. Isodense LEFT palatine tonsillar mass versus focal lymphoid hyperplasia resulting in LEFT postobstructive middle ear/mastoid effusion.       10/03/2015 Imaging    ECHO showed normal EF      10/08/2015 PET scan    Hypermetabolic left nasopharyngeal and tongue base masses with hypermetabolic bilateral neck adenopathy. Right level 2 adenopathy extends inferiorly into the low right internal jugular station. No additional evidence of metastatic disease in the chest, abdomen or pelvis.      10/09/2015 Procedure    Successful placement of a left internal jugular approach power injectable Port-A-Cath. The catheter is ready for immediate use.      10/13/2015 - 11/24/2015 Chemotherapy    She received 3 cycles of chemotherapy with R-CHOP      12/15/2015 PET scan    Interval complete resolution of hypermetabolic activity associated with the left tonsillar mass and multiple cervical lymph nodes consistent with complete remission. No residual hypermetabolic activity demonstrated.       INTERVAL HISTORY: Please see below for problem oriented charting. She returns today with her husband for further review of test results. She had recent nausea vomiting, resolved. Denies recent fever or chills. No new lymphadenopathy. Denies recent infection  REVIEW OF SYSTEMS:   Constitutional: Denies fevers, chills or abnormal weight loss Eyes: Denies blurriness of vision Ears, nose, mouth, throat, and face: Denies mucositis or sore throat Respiratory: Denies cough, dyspnea or wheezes Cardiovascular: Denies palpitation, chest discomfort or lower extremity swelling Gastrointestinal:  Denies nausea, heartburn or change in bowel habits Skin: Denies abnormal skin rashes  Lymphatics: Denies new lymphadenopathy or easy bruising Neurological:Denies numbness, tingling or new  weaknesses Behavioral/Psych: Mood is stable, no new changes  All other systems were reviewed with the patient and are negative.  I have reviewed the past medical history, past surgical history, social history and family history with the patient and they are unchanged from previous note.  ALLERGIES:  is allergic to latex; penicillins; and sulfa antibiotics.  MEDICATIONS:  Current Outpatient Prescriptions  Medication Sig Dispense Refill  . allopurinol (ZYLOPRIM) 300 MG tablet Take 1 tablet (300 mg total) by mouth daily. 30 tablet 0  . Ascorbic Acid (VITAMIN C PO) Take 1 tablet by mouth daily.    . Cholecalciferol (VITAMIN D PO) Take 1 tablet by mouth daily.    Marland Kitchen lidocaine-prilocaine (EMLA) cream Apply to affected area once 30 g 3  . Multiple Vitamins-Minerals (MULTIVITAMIN WITH MINERALS) tablet Take 1 tablet by mouth daily.    . naproxen sodium (ANAPROX) 220 MG tablet Take 220 mg by mouth 2 (two) times daily as needed (for pain).    . predniSONE (DELTASONE) 20 MG tablet Take 3 tablets (60 mg total) by mouth daily. Take on days 2-5 of chemotherapy. 36 tablet 0  . ondansetron (ZOFRAN) 8 MG tablet Take 1 tablet (8 mg total) by mouth every 8 (eight) hours as needed for refractory nausea / vomiting. (Patient not taking: Reported on 12/15/2015) 30 tablet 1  . prochlorperazine (COMPAZINE) 10 MG tablet Take 1 tablet (10 mg total) by mouth every 6 (six) hours as needed (Nausea or vomiting). (Patient not taking: Reported on 12/15/2015) 30 tablet 6   No current facility-administered medications for this visit.     PHYSICAL EXAMINATION: ECOG PERFORMANCE STATUS: 0 - Asymptomatic  Vitals:   12/15/15 0932  BP: 127/68  Pulse: 67  Resp: 16  Temp: 97.9 F (36.6 C)   Filed Weights   12/15/15 0932  Weight: 209 lb 4.8 oz (94.9 kg)    GENERAL:alert, no distress and comfortable SKIN: skin color, texture, turgor are normal, no rashes or significant lesions EYES: normal, Conjunctiva are pink and  non-injected, sclera clear Musculoskeletal:no cyanosis of digits and no clubbing  NEURO: alert & oriented x 3 with fluent speech, no focal motor/sensory deficits  LABORATORY DATA:  I have reviewed the data as listed    Component Value Date/Time   NA 141 12/15/2015 0908   K 3.9 12/15/2015 0908   CL 103 10/09/2015 0900   CO2 25 12/15/2015 0908   GLUCOSE 111 12/15/2015 0908   BUN 16.0 12/15/2015 0908   CREATININE 0.7 12/15/2015 0908   CALCIUM 9.0 12/15/2015 0908   PROT 6.8 12/15/2015 0908   ALBUMIN 3.3 (L) 12/15/2015 0908   AST 21 12/15/2015 0908   ALT 25 12/15/2015 0908   ALKPHOS 67 12/15/2015 0908   BILITOT 0.25 12/15/2015 0908   GFRNONAA >60 10/09/2015 0900   GFRAA >60 10/09/2015 0900    No results found for: SPEP, UPEP  Lab Results  Component Value Date   WBC 2.6 (L) 12/15/2015   NEUTROABS 0.9 (L) 12/15/2015   HGB 11.0 (L) 12/15/2015   HCT 33.6 (L) 12/15/2015   MCV 88.1 12/15/2015   PLT 311 12/15/2015      Chemistry      Component Value Date/Time   NA 141 12/15/2015 0908   K 3.9 12/15/2015 0908   CL 103 10/09/2015 0900   CO2 25 12/15/2015 0908   BUN 16.0 12/15/2015 0908   CREATININE 0.7 12/15/2015 0908  Component Value Date/Time   CALCIUM 9.0 12/15/2015 0908   ALKPHOS 67 12/15/2015 0908   AST 21 12/15/2015 0908   ALT 25 12/15/2015 0908   BILITOT 0.25 12/15/2015 0908       RADIOGRAPHIC STUDIES:I reviewed multiple imaging study with her and her husband I have personally reviewed the radiological images as listed and agreed with the findings in the report. Nm Pet Image Restag (ps) Skull Base To Thigh  Result Date: 12/15/2015 CLINICAL DATA:  Subsequent treatment strategy for lymphoma. Large B-cell lymphoma post chemotherapy. EXAM: NUCLEAR MEDICINE PET SKULL BASE TO THIGH TECHNIQUE: 9.2 mCi F-18 FDG was injected intravenously. Full-ring PET imaging was performed from the skull base to thigh after the radiotracer. CT data was obtained and used for  attenuation correction and anatomic localization. FASTING BLOOD GLUCOSE:  Value: 117 mg/dl COMPARISON:  PET-CT 10/08/2015 FINDINGS: NECK No residual hypermetabolic cervical lymph nodes are identified. The previously demonstrated enlarged cervical lymph nodes bilaterally are no longer seen. There is no residual hypermetabolic activity within the nasopharyngeal space.The thyroid gland demonstrates no abnormal activity. CHEST There are no hypermetabolic mediastinal, hilar or axillary lymph nodes. There is no hypermetabolic pulmonary activity. 3 mm left lower lobe pulmonary nodule on image 69 is stable. The lungs are otherwise clear. Left IJ Port-A-Cath extends to the SVC right atrial junction. ABDOMEN/PELVIS There is no hypermetabolic activity within the liver, adrenal glands, spleen or pancreas. There is no hypermetabolic nodal activity. SKELETON There is no hypermetabolic activity to suggest osseous metastatic disease. IMPRESSION: Interval complete resolution of hypermetabolic activity associated with the left tonsillar mass and multiple cervical lymph nodes consistent with complete remission. No residual hypermetabolic activity demonstrated. Electronically Signed   By: Richardean Sale M.D.   On: 12/15/2015 09:00     ASSESSMENT & PLAN:  Diffuse large B-cell lymphoma of lymph nodes of neck (HCC) Overall, she tolerated treatment well apart from recent nausea and neutropenia. Her nausea has resolved. Clinically, she is responding to treatment PET/CT scans showed complete response to treatment. I reviewed the current guidelines. I will refer her to radiation oncologist to discuss consolidation treatment with radiation therapy. Plan to see her back next month during her radiation treatment for supportive care visits  Drug-induced neutropenia ([place]) This is related to recent treatment. We discussed neutropenic precautions I suspect it would improve in time.   Orders Placed This Encounter  Procedures  .  Ambulatory referral to Radiation Oncology    Referral Priority:   Routine    Referral Type:   Consultation    Referral Reason:   Specialty Services Required    Referred to Provider:   Eppie Gibson, MD    Requested Specialty:   Radiation Oncology    Number of Visits Requested:   1   All questions were answered. The patient knows to call the clinic with any problems, questions or concerns. No barriers to learning was detected. I spent 20 minutes counseling the patient face to face. The total time spent in the appointment was 25 minutes and more than 50% was on counseling and review of test results     Heath Lark, MD 12/15/2015 10:46 AM

## 2015-12-15 NOTE — Assessment & Plan Note (Signed)
This is related to recent treatment. We discussed neutropenic precautions I suspect it would improve in time.

## 2015-12-16 ENCOUNTER — Encounter: Payer: Self-pay | Admitting: *Deleted

## 2015-12-16 ENCOUNTER — Ambulatory Visit
Admission: RE | Admit: 2015-12-16 | Discharge: 2015-12-16 | Disposition: A | Discharge status: Home / Self Care | Payer: 59 | Source: Ambulatory Visit | Attending: Radiation Oncology | Admitting: Radiation Oncology

## 2015-12-16 ENCOUNTER — Encounter: Payer: Self-pay | Admitting: Radiation Oncology

## 2015-12-16 VITALS — BP 111/75 | HR 75 | Temp 98.2°F | Resp 20 | Ht 64.0 in | Wt 207.2 lb

## 2015-12-16 DIAGNOSIS — C8331 Diffuse large B-cell lymphoma, lymph nodes of head, face, and neck: Secondary | ICD-10-CM

## 2015-12-16 DIAGNOSIS — Z882 Allergy status to sulfonamides status: Secondary | ICD-10-CM | POA: Insufficient documentation

## 2015-12-16 DIAGNOSIS — Z510 Encounter for antineoplastic radiation therapy: Secondary | ICD-10-CM | POA: Diagnosis not present

## 2015-12-16 DIAGNOSIS — Z79899 Other long term (current) drug therapy: Secondary | ICD-10-CM | POA: Diagnosis not present

## 2015-12-16 DIAGNOSIS — Z8249 Family history of ischemic heart disease and other diseases of the circulatory system: Secondary | ICD-10-CM | POA: Insufficient documentation

## 2015-12-16 DIAGNOSIS — R634 Abnormal weight loss: Secondary | ICD-10-CM

## 2015-12-16 DIAGNOSIS — Z91040 Latex allergy status: Secondary | ICD-10-CM | POA: Insufficient documentation

## 2015-12-16 DIAGNOSIS — Z823 Family history of stroke: Secondary | ICD-10-CM | POA: Insufficient documentation

## 2015-12-16 DIAGNOSIS — Z809 Family history of malignant neoplasm, unspecified: Secondary | ICD-10-CM | POA: Insufficient documentation

## 2015-12-16 DIAGNOSIS — Z880 Allergy status to penicillin: Secondary | ICD-10-CM | POA: Insufficient documentation

## 2015-12-16 DIAGNOSIS — Z9221 Personal history of antineoplastic chemotherapy: Secondary | ICD-10-CM | POA: Diagnosis not present

## 2015-12-16 NOTE — Addendum Note (Signed)
Encounter addended by: Eppie Gibson, MD on: 12/16/2015  9:49 AM<BR>    Actions taken: LOS modified, Follow-up modified

## 2015-12-16 NOTE — Progress Notes (Signed)
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 Diffuse large B-cell lymphoma of lymph nodes of neck (HCC) 202.81 C83.31 TSH     Ambulatory referral to Nutrition and Diabetic Education     Ambulatory referral to Physical Therapy     Ambulatory referral to Dentistry     Ambulatory referral to Social Work  2. Loss of weight 783.21 R63.4 TSH    CHIEF COMPLAINT: Here to discuss management of lymphoma following chemotherapy  HISTORY OF PRESENT ILLNESS::Charlotte Davidson is a 55 y.o. female who presented with congestion, mild hearing issues on left, and right neck swelling. She saw Dr Erik Obey.  On 09-24-15 "NASOPHARYNGEAL MASS", BIOPSY: revealed Diffuse large B-cell lymphoma. Biopsy of right level II neck mass on same date revealed: large cell lymphoma c/w with cells from nasopharyngeal bx.  Subsequently, the patient saw Dr. Alvy Bimler who completed staging workup, determining Stage II lymphoma.   Pertinent imaging included pre treatment CT of neck (09-30-15) and PET (10-08-15) revealing tumors in the nasopharynx, oropharynx, and bilateral neck, but no distant metastases   She received RCHOP x 3 cycles from 9-18 to 11-24-15. PET on 12-15-15 revealed complete response.  Swallowing issues, if any: none  Weight Changes: none before diagnosis, about 10lb since  Pain status: has mouth sores.  Other symptoms: mild taste changes, mild HA, dry mouth, hot flashes r/t menopause  Denies chance of pregnancy   No palpable residual masses/lymphatic swelling, no abd pain, no diarrhea, no incontinence of urine, denies anxiety or depression, denies chest pain, no  shortness of breath, no fever, no drenching night sweats before dx, no skin rashes, no numbness, no focal weakness.  Working at [employer].  Wears a face mask due to depressed white count  PREVIOUS RADIATION THERAPY: No  PAST MEDICAL HISTORY:  has a past medical history of Lymphoma ([place]).    PAST SURGICAL HISTORY: Past Surgical History:  Procedure Laterality Date  . BREAST SURGERY  01.17.2017   incise and drain right breast abscess  . CESAREAN SECTION  09.10.1996  . CHOLECYSTECTOMY    . INCISION AND DRAINAGE ABSCESS Right 02/11/2015   Procedure: INCISION AND DRAINAGE BREAST ABSCESS, RIGHT  BREAST BIOPSY;  Surgeon: Fanny Skates, MD;  Location: [facility];  Service: General;  Laterality: Right;  . IR GENERIC HISTORICAL  10/09/2015   IR FLUORO GUIDE PORT INSERTION LEFT 10/09/2015 Sandi Mariscal, MD [facility]  . IR GENERIC HISTORICAL  10/09/2015   IR US GUIDE VASC ACCESS LEFT 10/09/2015 Sandi Mariscal, MD [facility]  . TONSILLECTOMY      FAMILY HISTORY: family history includes Cancer in her father; Hypertension in her mother; Stroke in her mother.  SOCIAL HISTORY:  reports that she has never smoked. She has never used smokeless tobacco. She reports that she does not drink alcohol or use drugs.  ALLERGIES: Latex; Penicillins; and Sulfa antibiotics  MEDICATIONS:  Current Outpatient Prescriptions  Medication Sig Dispense Refill  . Ascorbic Acid (VITAMIN C PO) Take 1 tablet by mouth daily.    . Cholecalciferol (VITAMIN D PO) Take 1 tablet by mouth daily.    Marland Kitchen lidocaine-prilocaine (EMLA) cream Apply to affected  area once 30 g 3  . Multiple Vitamins-Minerals (MULTIVITAMIN WITH MINERALS) tablet Take 1 tablet by mouth daily.    . naproxen sodium (ANAPROX) 220 MG tablet Take 220 mg by mouth 2 (two) times daily as needed (for pain).    Marland Kitchen allopurinol (ZYLOPRIM) 300 MG tablet Take 1 tablet (300 mg total) by mouth daily. (Patient not taking: Reported on 12/16/2015) 30 tablet 0  . ondansetron (ZOFRAN)  8 MG tablet Take 1 tablet (8 mg total) by mouth every 8 (eight) hours as needed for refractory nausea / vomiting. (Patient not taking: Reported on 12/16/2015) 30 tablet 1  . predniSONE (DELTASONE) 20 MG tablet Take 3 tablets (60 mg total) by mouth daily. Take on days 2-5 of chemotherapy. (Patient not taking: Reported on 12/16/2015) 36 tablet 0  . prochlorperazine (COMPAZINE) 10 MG tablet Take 1 tablet (10 mg total) by mouth every 6 (six) hours as needed (Nausea or vomiting). (Patient not taking: Reported on 12/16/2015) 30 tablet 6   No current facility-administered medications for this encounter.     REVIEW OF SYSTEMS: A 10 POINT REVIEW OF SYSTEMS WAS OBTAINED.  All pertinent positives are noted in the HPI.  All others are negative.    PHYSICAL EXAM:  height is 5\' 4"  (1.626 m) and weight is 207 lb 3.2 oz (94 kg). Her oral temperature is 98.2 F (36.8 C). Her blood pressure is 111/75 and her pulse is 75. Her respiration is 20 and oxygen saturation is 100%.   General: Alert and oriented, in no acute distress HEENT: Alopecia. Head is normocephalic. Extraocular movements are intact. Oropharynx is notable for no lesions through difficult exam due to strong gag reflex. Neck: Neck is notable for no palpable masses Heart: Regular in rate and rhythm with no murmurs, rubs, or gallops. Chest: Clear to auscultation bilaterally, with no rhonchi, wheezes, or rales. Abdomen: Soft, nontender, nondistended, with no rigidity or guarding. Extremities: No cyanosis or edema. Lymphatics: see Neck Exam Skin: No concerning lesions. Musculoskeletal: symmetric strength and muscle tone throughout. Neurologic: Cranial nerves II through XII are grossly intact. No obvious focalities. Speech is fluent. Coordination is intact. Psychiatric: Judgment and insight are intact. Affect is appropriate.  ECOG = 0  0 - Asymptomatic (Fully active, able to carry on all predisease activities without restriction)  1 - Symptomatic but  completely ambulatory (Restricted in physically strenuous activity but ambulatory and able to carry out work of a light or sedentary nature. For example, light housework, office work)  2 - Symptomatic, <50% in bed during the day (Ambulatory and capable of all self care but unable to carry out any work activities. Up and about more than 50% of waking hours)  3 - Symptomatic, >50% in bed, but not bedbound (Capable of only limited self-care, confined to bed or chair 50% or more of waking hours)  4 - Bedbound (Completely disabled. Cannot carry on any self-care. Totally confined to bed or chair)  5 - Death   Eustace Pen MM, Creech RH, Tormey DC, et al. [phone]). "Toxicity and response criteria of the [facility]". [place] Oncol. 5 (6): 649-55   LABORATORY DATA:  Lab Results  Component Value Date   WBC 2.6 (L) 12/15/2015   HGB 11.0 (L) 12/15/2015   HCT 33.6 (L) 12/15/2015   MCV 88.1 12/15/2015   PLT 311 12/15/2015   CMP     Component Value Date/Time   NA 141 12/15/2015 0908   K 3.9 12/15/2015 0908   CL  103 10/09/2015 0900   CO2 25 12/15/2015 0908   GLUCOSE 111 12/15/2015 0908   BUN 16.0 12/15/2015 0908   CREATININE 0.7 12/15/2015 0908   CALCIUM 9.0 12/15/2015 0908   PROT 6.8 12/15/2015 0908   ALBUMIN 3.3 (L) 12/15/2015 0908   AST 21 12/15/2015 0908   ALT 25 12/15/2015 0908   ALKPHOS 67 12/15/2015 0908   BILITOT 0.25 12/15/2015 0908   GFRNONAA >60 10/09/2015 0900   GFRAA >60 10/09/2015 0900         RADIOGRAPHY images were reviewed by me (see above notes in HPI as well): Nm Pet Image Restag (ps) Skull Base To Thigh  Result Date: 12/15/2015 CLINICAL DATA:  Subsequent treatment strategy for lymphoma. Large B-cell lymphoma post chemotherapy. EXAM: NUCLEAR MEDICINE PET SKULL BASE TO THIGH TECHNIQUE: 9.2 mCi F-18 FDG was injected intravenously. Full-ring PET imaging was performed from the skull base to thigh after the radiotracer. CT data was obtained and used  for attenuation correction and anatomic localization. FASTING BLOOD GLUCOSE:  Value: 117 mg/dl COMPARISON:  PET-CT 10/08/2015 FINDINGS: NECK No residual hypermetabolic cervical lymph nodes are identified. The previously demonstrated enlarged cervical lymph nodes bilaterally are no longer seen. There is no residual hypermetabolic activity within the nasopharyngeal space.The thyroid gland demonstrates no abnormal activity. CHEST There are no hypermetabolic mediastinal, hilar or axillary lymph nodes. There is no hypermetabolic pulmonary activity. 3 mm left lower lobe pulmonary nodule on image 69 is stable. The lungs are otherwise clear. Left IJ Port-A-Cath extends to the SVC right atrial junction. ABDOMEN/PELVIS There is no hypermetabolic activity within the liver, adrenal glands, spleen or pancreas. There is no hypermetabolic nodal activity. SKELETON There is no hypermetabolic activity to suggest osseous metastatic disease. IMPRESSION: Interval complete resolution of hypermetabolic activity associated with the left tonsillar mass and multiple cervical lymph nodes consistent with complete remission. No residual hypermetabolic activity demonstrated. Electronically Signed   By: Richardean Sale M.D.   On: 12/15/2015 09:00      IMPRESSION/PLAN:  This is a delightful patient with STAGE IIA DLBCL, CR to chemotherapy. I do recommend radiotherapy for this patient for curative treatment, I would treat the involved sites to 30.6Gy in 17 fractions with IMRT which is medically necessary to spare oral cavity, parotid glands, esophagus.  We discussed the potential risks, benefits, and side effects of radiotherapy. We talked in detail about acute and late effects. We discussed that some of the most bothersome acute effects may be mucositis, dysgeusia, salivary changes, skin irritation, hair loss,  and fatigue. We talked about late effects which include but are not necessarily limited to  hypothyroidism,  xerostomia,  and neck  edema. No guarantees of treatment were given. A consent form was signed and placed in the patient's medical record. The patient is enthusiastic about proceeding with treatment. I look forward to participating in the patient's care.    Simulation (treatment planning) will take place after release by dentistry  We also discussed that the treatment of head and neck cancer is a multidisciplinary process to maximize treatment outcomes and quality of life. For this reasons the following referrals have been or will be made:   Dentistry for dental evaluation, scatter guards, and advice on reducing risk of cavities or other oral issues.   Nutritionist for nutrition support during and after treatment.   Social work for social support.    Physical therapy due to risk of lymphedema in neck and deconditioning.   Baseline labs including TSH. No results found for:  TSH  __________________________________________   Eppie Gibson, MD

## 2015-12-16 NOTE — Progress Notes (Signed)
Oncology Nurse Navigator Documentation  Met with Charlotte Davidson during initial consult with Dr. Isidore Moos.  She was accompanied by her husband.  1. Further introduced myself as her Navigator, explained my role as a member of the Care Team.   2. Provided New Patient Information packet, discussed contents:  Contact information for physician(s), myself, other members of the Care Team.  Advance Directive information ([place] blue pamphlet with LCSW contact info)  Fall Prevention Patient Safety Plan  Appointment [place] sheet  [facility] map with highlight of [place] 3. Provided introductory explanation of radiation treatment including SIM planning and purpose of Aquaplast head and shoulder mask, showed them example.   4. Provided a tour of SIM and [place] areas, explained treatment and arrival procedures. 5. Confirmed her understanding of location of Dental Medicine for pending appt. 6. Discussed her attendance at next week's [facility] [place] to see Nutrition, PT, SW and Financial Counseling.  She confirmed availability, I gave her an 0800 arrival time.  I provided [facility] information sheet.  7. I encouraged them to contact me with questions/concerns as treatments/procedures begin.  They verbalized understanding of information provided.    Gayleen Orem, RN, BSN, [place] [facility] [place] [phone]

## 2015-12-17 ENCOUNTER — Ambulatory Visit (HOSPITAL_COMMUNITY): Payer: Self-pay | Admitting: Dentistry

## 2015-12-17 ENCOUNTER — Ambulatory Visit: Payer: 59

## 2015-12-17 ENCOUNTER — Encounter (HOSPITAL_COMMUNITY): Payer: Self-pay | Admitting: Dentistry

## 2015-12-17 VITALS — BP 113/64 | HR 71 | Temp 98.1°F

## 2015-12-17 DIAGNOSIS — K08409 Partial loss of teeth, unspecified cause, unspecified class: Secondary | ICD-10-CM

## 2015-12-17 DIAGNOSIS — K036 Deposits [accretions] on teeth: Secondary | ICD-10-CM

## 2015-12-17 DIAGNOSIS — M264 Malocclusion, unspecified: Secondary | ICD-10-CM

## 2015-12-17 DIAGNOSIS — Z01818 Encounter for other preprocedural examination: Secondary | ICD-10-CM

## 2015-12-17 DIAGNOSIS — K0530 Chronic periodontitis, unspecified: Secondary | ICD-10-CM

## 2015-12-17 DIAGNOSIS — C8331 Diffuse large B-cell lymphoma, lymph nodes of head, face, and neck: Secondary | ICD-10-CM

## 2015-12-17 MED ORDER — SODIUM FLUORIDE 1.1 % DT GEL: DENTAL | 99 refills | Status: DC

## 2015-12-17 MED FILL — FLUORISHIELD 1.1% GEL: 1.1 % | 30 days supply | Qty: 114 | Fill #0

## 2015-12-17 NOTE — Patient Instructions (Addendum)

## 2015-12-17 NOTE — Progress Notes (Signed)
DENTAL CONSULTATION  Date of Consultation:  12/17/2015 Patient Name:   Charlotte Davidson Date of Birth:   [birth year] Medical Record Number: [id number]  VITALS: BP 113/64 (BP Location: Left Arm)   Pulse 71   Temp 98.1 F (36.7 C) (Oral)   LMP 10/09/2011 (Approximate)   CHIEF COMPLAINT: Patient referred by Dr. Isidore Moos for a dental consultation.  HPI: Charlotte Davidson is a 55 year old female with history of diffuse B-cell lymphoma involving the nasopharynx, base of tongue, and neck. Patient underwent 3 cycles of chemotherapy with Dr. Alvy Bimler from 10/13/2015 through 11/24/2015. Patient with anticipated radiation therapy with Dr. Isidore Moos. Patient is now seen as part of a preradiation therapy dental protocol examination.  The patient currently denies acute toothaches, swellings, or abscesses. Patient was last seen in June of 2017 for an exam and cleaning with her primary dentist, Dr. Gordy Levan in [place], [place]. Patient is due for her next cleaning on January 12, 2016 by report.  Patient denies having any unmet dental needs. Patient denies having partial dentures. Patient denies having dental phobia.   PROBLEM LIST: Patient Active Problem List   Diagnosis Date Noted  . Diffuse large B-cell lymphoma of lymph nodes of neck ([place]) 10/01/2015    Priority: Medium  . Drug-induced neutropenia ([place]) 11/03/2015  . Chemotherapy-induced nausea 11/03/2015  . Full code status 10/10/2015  . Left breast abscess 02/11/2015    PMH: Past Medical History:  Diagnosis Date  . Lymphoma ([place])     PSH: Past Surgical History:  Procedure Laterality Date  . BREAST SURGERY  01.17.2017   incise and drain right breast abscess  . CESAREAN SECTION  09.10.1996  . CHOLECYSTECTOMY    . INCISION AND DRAINAGE ABSCESS Right 02/11/2015   Procedure: INCISION AND DRAINAGE BREAST ABSCESS, RIGHT  BREAST BIOPSY;  Surgeon: Fanny Skates, MD;  Location: [facility];  Service: General;  Laterality: Right;  . IR  GENERIC HISTORICAL  10/09/2015   IR FLUORO GUIDE PORT INSERTION LEFT 10/09/2015 Sandi Mariscal, MD [facility]  . IR GENERIC HISTORICAL  10/09/2015   IR US GUIDE VASC ACCESS LEFT 10/09/2015 Sandi Mariscal, MD [facility]  . TONSILLECTOMY      ALLERGIES: Allergies  Allergen Reactions  . Latex Rash  . Penicillins Rash    Has patient had a PCN reaction causing immediate rash, facial/tongue/throat swelling, SOB or lightheadedness with hypotension: no Has patient had a PCN reaction causing severe rash involving mucus membranes or skin necrosis: unknown Has patient had a PCN reaction that required hospitalization : no Has patient had a PCN reaction occurring within the last 10 years: no If all of the above answers are "NO", then may proceed with Cephalosporin use.   . Sulfa Antibiotics Rash    MEDICATIONS: Current Outpatient Prescriptions  Medication Sig Dispense Refill  . allopurinol (ZYLOPRIM) 300 MG tablet Take 1 tablet (300 mg total) by mouth daily. (Patient not taking: Reported on 12/17/2015) 30 tablet 0  . Ascorbic Acid (VITAMIN C PO) Take 1 tablet by mouth daily.    . Cholecalciferol (VITAMIN D PO) Take 1 tablet by mouth daily.    Marland Kitchen lidocaine-prilocaine (EMLA) cream Apply to affected area once 30 g 3  . Multiple Vitamins-Minerals (MULTIVITAMIN WITH MINERALS) tablet Take 1 tablet by mouth daily.    . naproxen sodium (ANAPROX) 220 MG tablet Take 220 mg by mouth 2 (two) times daily as needed (for pain).    . ondansetron (ZOFRAN) 8 MG tablet Take 1 tablet (8 mg total)  by mouth every 8 (eight) hours as needed for refractory nausea / vomiting. (Patient not taking: Reported on 12/17/2015) 30 tablet 1  . predniSONE (DELTASONE) 20 MG tablet Take 3 tablets (60 mg total) by mouth daily. Take on days 2-5 of chemotherapy. (Patient not taking: Reported on 12/17/2015) 36 tablet 0  . prochlorperazine (COMPAZINE) 10 MG tablet Take 1 tablet (10 mg total) by mouth every 6 (six) hours as needed (Nausea or  vomiting). (Patient not taking: Reported on 12/17/2015) 30 tablet 6   No current facility-administered medications for this visit.     LABS: Lab Results  Component Value Date   WBC 2.6 (L) 12/15/2015   HGB 11.0 (L) 12/15/2015   HCT 33.6 (L) 12/15/2015   MCV 88.1 12/15/2015   PLT 311 12/15/2015      Component Value Date/Time   NA 141 12/15/2015 0908   K 3.9 12/15/2015 0908   CL 103 10/09/2015 0900   CO2 25 12/15/2015 0908   GLUCOSE 111 12/15/2015 0908   BUN 16.0 12/15/2015 0908   CREATININE 0.7 12/15/2015 0908   CALCIUM 9.0 12/15/2015 0908   GFRNONAA >60 10/09/2015 0900   GFRAA >60 10/09/2015 0900   Lab Results  Component Value Date   INR 1.09 10/09/2015   No results found for: PTT  SOCIAL HISTORY: Social History   Social History  . Marital status: Married    Spouse name: Edwards  . Number of children: 2  . Years of education: N/A   Occupational History  . Tour manager    Social History Main Topics  . Smoking status: Never Smoker  . Smokeless tobacco: Never Used  . Alcohol use No  . Drug use: No  . Sexual activity: Not on file   Other Topics Concern  . Not on file   Social History Narrative  . No narrative on file    FAMILY HISTORY: Family History  Problem Relation Age of Onset  . Cancer Father     ?throat ca  . Stroke Mother   . Hypertension Mother     REVIEW OF SYSTEMS: Reviewed With the patient has per history of present illness.  Psych: Patient denies having dental phobia.  DENTAL HISTORY: CHIEF COMPLAINT: Patient referred by Dr. Isidore Moos for a dental consultation.  HPI: Charlotte Davidson is a 55 year old female with history of diffuse B-cell lymphoma involving the nasopharynx, base of tongue, and neck. Patient underwent 3 cycles of chemotherapy with Dr. Alvy Bimler from 10/13/2015 through 11/24/2015. Patient with anticipated radiation therapy with Dr. Isidore Moos. Patient is now seen as part of a preradiation therapy dental protocol  examination.  The patient currently denies acute toothaches, swellings, or abscesses. Patient was last seen in June of 2017 for an exam and cleaning with her primary dentist, Dr. Gordy Levan in [place], [place]. Patient is due for her next cleaning on January 12, 2016 by report.  Patient denies having any unmet dental needs. Patient denies having partial dentures. Patient denies having dental phobia.  DENTAL EXAMINATION:                        Latex precautions followed.  GENERAL:Patient is a well-developed, well-nourished female in no acute distress.  HEAD AND NECK: No neck lymphadenopathy is palpated. Patient denies acute TMJ symptoms. Maximum interincisal opening is 45 mm.  INTRAORAL EXAM:Patient has normal saliva. There is no evidence of oral abscess formation. Patient has some mucositis involving the buccal vestibule in the area of #17.  This is resolving with salt water rinses by patient report.  DENTITION:Patient is missing tooth numbers 1, 16, 18, 19, 23 -26, 30, and 32. There is a PFM bridge from tooth numbers 22 through 27 replacing the missing teeth. Tooth #31 has moved mesially to close the space between tooth numbers 29 and 31.  PERIODONTAL: The patient has chronic periodontitis with plaque and calculus accumulations, selective areas of gingival recession and no significant tooth mobility. There is incipient bone loss noted.  DENTAL CARIES/SUBOPTIMAL RESTORATIONS:There are no obvious dental caries noted.  ENDODONTIC:Patient denies acute pulpitis symptoms. There is no evidence of periapical pathology or radiolucency. Patient has a previous root canal therapy associated with tooth #14.  [place] has a PFM bridge from tooth numbers 22 through 27. She has a crown on tooth #14. These appear to be clinically acceptable. There is a small fracture line in the metal on the pontic #25 on the lingual aspect. PROSTHODONTIC: There are no partial dentures.  OCCLUSION:Patient  has a poor occlusal scheme secondary to multiple missing teeth, multiple diastemas, supra-eruption and drifting of the unopposed teeth into the edentulous spaces, and lack of replacement of all missing teeth with dental prostheses. The occlusion is stable, however.  RADIOGRAPHIC INTERPRETATION: An orthopantogram was taken and supplemented with 10 periapical radiographs and 4 bitewings. Patient would not allow lower molar and premolar periapical radiographs due to significant gagging at this visit. There are multiple missing teeth. There is incipient to moderate bone loss. Multiple dental restorations are noted. There is supra-eruption and drifting of the unopposed teeth into the edentulous areas. There is no evidence of periapical Pathology or radiolucency. There is a bridge from tooth #22 through 27. There is a crown on tooth #14 with previous root canal therapy.  ASSESSMENTS: 1. B-cell lymphoma involving the base of tongue, nasopharynx, and neck 2. Status post 3 cycles of chemotherapy with Dr. Alvy Bimler 3. Anticipated radiation therapy with Dr. Isidore Moos 4. Preradiation therapy dental protocol examination 5. Chronic periodontitis with bone loss 6. Selective areas of gingival recession 7. Accretions-minimal 8. Multiple missing teeth 9. Multiple diastemas 10 Supra-eruption and drifting of the unopposed teeth into the edentulous areas 11. Poor occlusal scheme but stable occlusion 12. Significant gag reflex  PLAN/RECOMMENDATIONS: 1. I discussed the risks, benefits, and complications of various treatment options with the patient in relationship to her medical and dental conditions, anticipated radiation therapy, and radiation therapy side effects to include xerostomia, radiation caries, trismus, mucositis, taste changes, gum and jawbone changes, and risk for infection and osteoradionecrosis.  We discussed various treatment options to include no treatment, multiple extractions with alveoloplasty,  pre-prosthetic surgery as indicated, periodontal therapy, dental restorations, root canal therapy, crown and bridge therapy, implant therapy, and replacement of missing teeth as indicated.  We also discussed fabrication of fluoride trays and scatter protection devices.  The patient currently wishes to proceed with impressions today for the fabrication of the fluoride trays and scatter protection devices. These will be inserted on Tuesday morning, 12/23/2015 at 8 AM. Since the total radiation dose will be less than 4000 cGy I do not feel that extractions are needed at this time.  Patient will need to follow up with her primary dentist, Dr. Norman Herrlich, for a dental cleaning prior to start of radiation therapy as time and space permits.   2. Discussion of findings with medical team and coordination of future medical and dental care as needed.  I spent in excess of  120 minutes during the conduct  of this consultation and >50% of this time involved direct face-to-face encounter for counseling and/or coordination of the patient's care.    Lenn Cal, DDS

## 2015-12-22 ENCOUNTER — Telehealth: Payer: Self-pay | Admitting: *Deleted

## 2015-12-22 NOTE — Telephone Encounter (Signed)
Oncology Nurse Navigator Documentation  LVM reminder re arrival to [facility] for H&N [place] following 0800 Dental Medicine appt.  Gayleen Orem, RN, BSN, [place] at [place] [phone]

## 2015-12-22 NOTE — Progress Notes (Signed)
Does patient have an allergy to IV contrast dye?: NO   Has patient ever received premedication for IV contrast dye?: NO  Does patient take metformin?: NO  If patient does take metformin when was the last dose: N/A  Date of lab work: 12/15/15 BUN: 16 CR: 0.7  IV site: Left port a cath, patient gave name and dob as identification, using sterile technique, accessed left power port x 1 with 20 g  1 inch huber needle, excellent blood return, flushed with 69ml normal saline, op site applied over site,patient tolerated well, patient voiced no c/o 10:16 AM   Porta cath deaccessed after flushing with Normal Saline and Heparin per protocol. Bandaid applied over the site, and the patient voiced no complaints or concerns at the time of deaccess.

## 2015-12-23 ENCOUNTER — Ambulatory Visit
Admission: RE | Admit: 2015-12-23 | Discharge: 2015-12-23 | Disposition: A | Discharge status: Home / Self Care | Payer: 59 | Source: Ambulatory Visit | Attending: Radiation Oncology | Admitting: Radiation Oncology

## 2015-12-23 ENCOUNTER — Ambulatory Visit: Payer: 59 | Admitting: Nutrition

## 2015-12-23 ENCOUNTER — Ambulatory Visit: Payer: 59 | Admitting: Radiation Oncology

## 2015-12-23 ENCOUNTER — Encounter: Payer: Self-pay | Admitting: *Deleted

## 2015-12-23 ENCOUNTER — Encounter: Payer: Self-pay | Admitting: Radiation Oncology

## 2015-12-23 ENCOUNTER — Encounter (HOSPITAL_COMMUNITY): Payer: Self-pay | Admitting: Dentistry

## 2015-12-23 ENCOUNTER — Ambulatory Visit (HOSPITAL_COMMUNITY): Payer: Medicaid - Dental | Admitting: Dentistry

## 2015-12-23 ENCOUNTER — Ambulatory Visit: Payer: 59 | Attending: Radiation Oncology | Admitting: Physical Therapy

## 2015-12-23 VITALS — BP 118/72 | HR 67 | Temp 97.5°F

## 2015-12-23 VITALS — BP 118/73 | HR 67 | Temp 97.8°F | Wt 206.4 lb

## 2015-12-23 DIAGNOSIS — C8580 Other specified types of non-Hodgkin lymphoma, unspecified site: Secondary | ICD-10-CM | POA: Diagnosis present

## 2015-12-23 DIAGNOSIS — C8331 Diffuse large B-cell lymphoma, lymph nodes of head, face, and neck: Secondary | ICD-10-CM

## 2015-12-23 DIAGNOSIS — Z463 Encounter for fitting and adjustment of dental prosthetic device: Secondary | ICD-10-CM | POA: Diagnosis not present

## 2015-12-23 DIAGNOSIS — Z01818 Encounter for other preprocedural examination: Secondary | ICD-10-CM

## 2015-12-23 DIAGNOSIS — Z9189 Other specified personal risk factors, not elsewhere classified: Secondary | ICD-10-CM | POA: Diagnosis present

## 2015-12-23 DIAGNOSIS — Z510 Encounter for antineoplastic radiation therapy: Secondary | ICD-10-CM | POA: Diagnosis not present

## 2015-12-23 MED ORDER — SODIUM CHLORIDE 0.9% FLUSH
10.0000 mL | Freq: Once | INTRAVENOUS | Status: AC
  Administered 2015-12-23: 10 mL via INTRAVENOUS

## 2015-12-23 MED ORDER — HEPARIN SOD (PORK) LOCK FLUSH 100 UNIT/ML IV SOLN
500.0000 [IU] | Freq: Once | INTRAVENOUS | Status: AC
  Administered 2015-12-23: 500 [IU] via INTRAVENOUS

## 2015-12-23 NOTE — Progress Notes (Signed)
Head and Neck Cancer Simulation, IMRT treatment planning  note   Outpatient  Diagnosis:    ICD-9-CM ICD-10-CM   1. Diffuse large B-cell lymphoma of lymph nodes of neck (HCC) 202.81 C83.31     The patient was taken to the CT simulator and laid in the supine position on the table. An Aquaplast head and shoulder mask was custom fitted to the patient's anatomy. High-resolution CT axial imaging was obtained of the head and neck with contrast. I verified that the quality of the imaging is good for treatment planning. 1 Medically Necessary Treatment Device was fabricated and supervised by me: Aquaplast mask.   Treatment planning note I plan to treat the patient with IMRT. I plan to treat the patient's original  tumor site and involved neck node sites. I plan to treat to a total dose of 30.6 Gray in 17  fractions. Dose calculation was ordered from dosimetry.  IMRT planning Note  IMRT is medically necessary and an important modality to deliver adequate dose to the patient's at risk tissues while sparing the patient's normal structures, including the: esophagus, parotid tissue, mandible, brain, brain stem, spinal cord, oral cavity. This justifies the use of IMRT in the patient's treatment.   -----------------------------------  Eppie Gibson, MD

## 2015-12-23 NOTE — Therapy (Signed)
[place], [place], [address] Phone: [phone]   Fax:  [phone]  Physical Therapy Evaluation  Patient Details  Name: Charlotte Davidson MRN: [id number] Date of Birth: 04-01-1960 Referring Provider: Dr. Eppie Gibson  Encounter Date: 12/23/2015      PT End of Session - 12/23/15 1052    Visit Number 1   Number of Visits 1   PT Start Time 0910   PT Stop Time 0935   PT Time Calculation (min) 25 min   Activity Tolerance Patient tolerated treatment well   Behavior During Therapy [facility] for tasks assessed/performed      Past Medical History:  Diagnosis Date  . Lymphoma [facility])     Past Surgical History:  Procedure Laterality Date  . BREAST SURGERY  01.17.2017   incise and drain right breast abscess  . CESAREAN SECTION  09.10.1996  . CHOLECYSTECTOMY    . INCISION AND DRAINAGE ABSCESS Right 02/11/2015   Procedure: INCISION AND DRAINAGE BREAST ABSCESS, RIGHT  BREAST BIOPSY;  Surgeon: Fanny Skates, MD;  Location: [facility];  Service: General;  Laterality: Right;  . IR GENERIC HISTORICAL  10/09/2015   IR FLUORO GUIDE PORT INSERTION LEFT 10/09/2015 Sandi Mariscal, MD [facility]INTER[facility]  . IR GENERIC HISTORICAL  10/09/2015   IR US GUIDE VASC ACCESS LEFT 10/09/2015 Sandi Mariscal, MD [facility]  . TONSILLECTOMY      There were no vitals filed for this visit.       Subjective Assessment - 12/23/15 1041    Subjective Had a few rough patches but did okay through chemo.   Pertinent History Diagnosed with diffuse large B-cell lymphoma of nasopharynx, base of tongue, and right neck lymph node.  Completed chemotherapy 11/24/15; PET on 12/15/15 showed complete response.  Adjuvant XRT recommended, and patient to have simulation today.    Patient Stated Goals get info from all head & neck clinic providers   Currently in Pain? No/denies            [facility] PT Assessment - 12/23/15 0001      Assessment   Medical Diagnosis  diffuse large B-cell lymphoma of nasopharynx, base of tongue, and right neck lymph node   Referring Provider Dr. Eppie Gibson   Prior Therapy none     Precautions   Precautions Other (comment)   Precaution Comments cancer precautions  wearing mask today due to low blood counts     Restrictions   Weight Bearing Restrictions No     Balance Screen   Has the patient fallen in the past 6 months No   Has the patient had a decrease in activity level because of a fear of falling?  No   Is the patient reluctant to leave their home because of a fear of falling?  No     Home Ecologist residence   Living Arrangements Spouse/significant other  has two children in their 60s   Type of [place] Two level     Prior Function   Level of Independence Independent   Leisure tries to walk daily for 20 minutes     Cognition   Overall Cognitive Status Within Functional Limits for tasks assessed     Observation/Other Assessments   Observations adult woman wearing protective mask and gloves today due to low blood counts from chemo     Functional Tests   Functional tests Sit to Stand     Sit  to Stand   Comments 20 repetitions in 30 seconds, above average for age  mildly SOB following this test     Posture/Postural Control   Posture/Postural Control No significant limitations     ROM / Strength   AROM / PROM / Strength AROM     AROM   Overall AROM Comments neck and shoulder AROM gross[facility]     Ambulation/Gait   Ambulation/Gait Yes   Ambulation/Gait Assistance 7: Independent           LYMPHEDEMA/ONCOLOGY QUESTIONNAIRE - 12/23/15 1049      Type   Cancer Type lymphoma     Treatment   Past Chemotherapy Treatment Yes   Date 11/24/15   Active Radiation Treatment Yes   Date --  to begin soon (late Nov./early Dec.)     Lymphedema Assessments   Lymphedema Assessments Head and Neck     Head and Neck   4 cm superior to sternal notch  around neck 35.3 cm   6 cm superior to sternal notch around neck 34.8 cm   8 cm superior to sternal notch around neck 35.5 cm                        PT Education - 12/23/15 1051    Education provided Yes   Education Details neck ROM, posture, breathing, walking for exercise, Cure article on staying active, lymphedema and PT info   Person(s) Educated Patient   Methods Explanation;Handout   Comprehension Verbalized understanding                 Head and Neck Clinic Goals - 12/23/15 1059      Patient will be able to verbalize understanding of a home exercise program for cervical range of motion, posture, and walking.    Status Achieved     Patient will be able to verbalize understanding of proper sitting and standing posture.    Status Achieved     Patient will be able to verbalize understanding of lymphedema risk and availability of treatment for this condition.    Status Achieved           Plan - 12/23/15 1052    Clinical Impression Statement Very pleasant woman currently wearing a protective mask and gloves due to low blood counts from chemotherapy for large B-cell lymphoma; she is expected to start adjuvant XRT soon.  She did well on most testing today, but is at risk for lymphedema and for deconditioning from treatment.   Rehab Potential Excellent   PT Frequency One time visit   PT Treatment/Interventions Patient/family education   PT Next Visit Plan None at this time; patient knows to request therapy if lymphedema or other problems develop with radiation treatment.   PT Home Exercise Plan neck ROM, walking   Consulted and Agree with Plan of Care Patient      Patient will benefit from skilled therapeutic intervention in order to improve the following deficits and impairments:  Decreased knowledge of precautions, Decreased knowledge of use of DME  Visit Diagnosis: At risk for lymphedema - Plan: PT plan of care  cert/re-cert  Lymphoma malignant, large cell ([place]) - Plan: PT plan of care cert/re-cert     Problem List Patient Active Problem List   Diagnosis Date Noted  . Drug-induced neutropenia ([place]) 11/03/2015  . Chemotherapy-induced nausea 11/03/2015  . Full code status 10/10/2015  . Diffuse large B-cell lymphoma of lymph nodes of neck ([place]) 10/01/2015  .  Left breast abscess 02/11/2015    Cayce Paschal 12/23/2015, 11:01 AM  [place], [place], [address] Phone: [phone]   Fax:  [phone]  Name: PAYTON BORCHERS MRN: [id number] Date of Birth: 1960/11/25  Serafina Royals, PT 12/23/15 11:02 AM

## 2015-12-23 NOTE — Progress Notes (Signed)
Patient was seen in Head and [place] clinic.  55 year old female diagnosed with B-cell lymphoma of the nasopharynx, tongue and neck.  She is a patient of Dr. Alvy Bimler.  Past medical history includes chemotherapy.  Medications include vitamin C, vitamin D, multivitamin, Zofran, prednisone and Compazine.  Labs include albumin 3.3.  Height: 5 feet 4 inches. Weight: 206.4 pounds. Usual body weight: 214 pounds. BMI: 35  Patient endorses recent weight loss. She reports her mouth sores have resolved.   She has mild taste alterations. She denies nausea, vomiting, constipation.  Nutrition diagnosis:  Food and nutrition related knowledge deficit related to B-cell lymphoma of the nasopharynx, tongue and neck as evidenced by no prior need for nutrition information.  Intervention:  Educated patient to begin small frequent meals and snacks with adequate calories and protein to promote weight maintenance. Reviewed high protein foods and provided a fact sheet on high-calorie, high-protein foods. Questions were answered.  Teach back method was used.  Contact information given.  Monitoring, evaluation, goals: Patient will tolerate adequate calories and protein to minimize weight loss throughout treatment and preserve lean body mass.  Next visit: To be scheduled.  **Disclaimer: This note was dictated with voice recognition software. Similar sounding words can inadvertently be transcribed and this note may contain transcription errors which may not have been corrected upon publication of note.**

## 2015-12-23 NOTE — Progress Notes (Signed)
Financial Counseling--Spoke with patient today regarding financial concerns--addressed questions and told her she could contact me with any additional questions she may have

## 2015-12-23 NOTE — Progress Notes (Signed)
12/23/2015  Patient Name:   Charlotte Davidson Date of Birth:   [birth year] Medical Record Number: [id number]  BP 118/72 (BP Location: Left Arm)   Pulse 67   Temp 97.5 F (36.4 C) (Oral)   LMP 10/09/2011 (Approximate)   [name] now presents for insertion of upper and lower fluoride trays and scatter protection devices. Patient saw Dr. Norman Herrlich yesterday for an exam and dental cleaning.  PROCEDURE: Appliances were tried in and adjusted as needed. [place] Trismus device was previously fabricated at 45 mm and using 26 sticks. Postop instructions were provided and a written and verbal format concerning the use and care of appliances. All questions were answered. Patient to return to clinic for periodic oral examination after radiation therapy in 6-7 weeks. Patient to call if questions or problems arise before then.  Lenn Cal, DDS

## 2015-12-23 NOTE — Patient Instructions (Signed)

## 2015-12-24 ENCOUNTER — Ambulatory Visit: Payer: 59

## 2015-12-24 ENCOUNTER — Ambulatory Visit: Payer: 59 | Admitting: Radiation Oncology

## 2015-12-24 NOTE — Addendum Note (Signed)
Encounter addended by: Ernst Spell, RN on: 12/24/2015  8:24 AM<BR>    Actions taken: Charge Capture section accepted

## 2015-12-25 NOTE — Progress Notes (Addendum)
Met with Ms. Clendenin during H&N [place].  She was unaccompanied.  Arrived her to Nursing, provided verbal and written overview of [place], the clinicians who will be seeing her, encouraged her to ask questions during her time with them.  She was seen by Nutrition, PT, SW and Financial Counseling.  Spoke with her at end of [facility], addressed questions.  I escorted her to CT SIM following PAC access, later provided tour of Radiology treatment area, explained registration and check-in procedures. She understands I can be contacted with needs/concerns.  Gayleen Orem, RN, BSN, [place] at [place] [phone]

## 2015-12-26 DIAGNOSIS — Z510 Encounter for antineoplastic radiation therapy: Secondary | ICD-10-CM | POA: Diagnosis not present

## 2016-01-01 ENCOUNTER — Ambulatory Visit
Admission: RE | Admit: 2016-01-01 | Discharge: 2016-01-01 | Disposition: A | Discharge status: Home / Self Care | Payer: 59 | Source: Ambulatory Visit | Attending: Radiation Oncology | Admitting: Radiation Oncology

## 2016-01-01 ENCOUNTER — Ambulatory Visit: Payer: 59 | Admitting: Nutrition

## 2016-01-01 ENCOUNTER — Encounter: Payer: Self-pay | Admitting: *Deleted

## 2016-01-01 DIAGNOSIS — C8331 Diffuse large B-cell lymphoma, lymph nodes of head, face, and neck: Secondary | ICD-10-CM

## 2016-01-01 DIAGNOSIS — Z510 Encounter for antineoplastic radiation therapy: Secondary | ICD-10-CM | POA: Diagnosis not present

## 2016-01-01 MED ORDER — SONAFINE EX EMUL
1.0000 | Freq: Once | CUTANEOUS | Status: DC
Start: 2016-01-01 — End: 2016-01-02

## 2016-01-01 NOTE — Progress Notes (Signed)
Pt here for patient teaching.  Pt given Radiation and You booklet, Managing Acute Radiation Side Effects for Head and Neck Cancer handout, skin care instructions and Sonafine. Pt reports they have not watched the Radiation Therapy Education video, but was given the link to watch at home.  Reviewed areas of pertinence such as fatigue, hair loss, mouth changes, skin changes, throat changes and taste changes . Pt able to give teach back of to pat skin, use unscented/gentle soap and drink plenty of water,apply Sonafine bid and avoid applying anything to skin within 4 hours of treatment. Pt verbalizes understanding of information given and will contact nursing with any questions or concerns.     Http://rtanswers.org/treatmentinformation/whattoexpect/index  Managing Acute Radiation Side Effects for Head and Neck Cancer  Skin irritation:  . Sonafine  Topical Emulsion: First-line topical cream to help soothe skin irritation.  Apply to skin in radiation fields at least 4 hours before radiotherapy, or any time after treatments during the rest of the day.  . Triple Antibiotic Ointment (Neosporin): Apply to areas of skin with moist breakdown to prevent infection.  . 1% hydrocortisone cream: Apply to areas of skin that are itching, up to three times a day.  Arnetha Massy (Silver Sulfadiazine): Used in select cases if large patches of skin develop moist breakdown (let physician or nurse know if you have a "sulfa" drug allergy)  Soreness in mouth or throat: . Baking Soda Rinse: a home remedy to soothe/cleanse mouth and loosen thick saliva.  Mix 1/2 teaspoon salt, 1/2 teaspoon baking soda, 1 pint water (16 oz or two cups).  Swish, gargle and spit as needed to soothe/cleanse mouth. Use as often as you want.  . Sucralfate (Carafate): coats throat to soothe it before meals or any time of day. Crush 1 tablet in 10 mL H20 and swallow up to four times a day.  . 2% viscous Lidocaine (Magic Mouth Wash): Soothes mouth  and/or throat by numbing your mucous membranes. Mix 1 part 2% viscous lidocaine (Magic Mouth Wash), 1 part H20. Swish and/or swallow 43mL of this mixture, 79min before meals and at bedtime, up to four times a day. Alternate with Sucralfate (Carafate).  . Narcotics: Various short acting and long acting narcotics can be prescribed.  Often, medical oncology will prescribe these if you are receiving chemotherapy concurrently. Narcotics may cause constipation. It may be helpful to take a stool softener (Docusate Sodium) or gentle laxative (ie Senna or Polyethylene Glycol) to prevent constipation.  Having food in your stomach before ingesting a narcotic may reduce risk of stomach upset.  Thick Saliva: . Baking Soda Rinse: a home remedy to soothe/cleanse mouth and loosen thick saliva.  Mix 1/2 teaspoon salt, 1/2 teaspoon baking soda, 1 pint water (16 oz or two cups).  Swish, gargle, and spit as needed to soothe/cleanse mouth. Use as often as you want.  . Some patients find Diet Ginger Ale or Papaya Juice to be helpful.  . In extreme cases, your physician may consider prescribing a Scopolamine transdermal patch which dries up your saliva.     Poor taste, or lack of taste:   . There are no well-established medications to combat taste bud changes from radiotherapy.  It often takes weeks to months to regain taste function.  Eating bland foods and drinking nutritional shakes  may help you maintain your weight when food is not enjoyable.  Some patients supplement their oral intake with a feeding tube.  Fatigue and weakness: . There is not a  well-established safe and effective medication to combat radiation-induced fatigue.  However, if you are able to perform light exercise (such as a daily walk, yoga, recumbent stationary bicycling), this may combat fatigue and help you maintain muscle mass during treatment.  . Maintaining hydration and nutrition are also important.  If you have not been referred to a  nutritionist and would like a referral, please let your nurse or physician know.  . Try to get at least 8 hours of sleep each night. You may need a daily nap, but try not to nap so late that it interferes with your nightly sleep schedule.

## 2016-01-01 NOTE — Progress Notes (Signed)
Brief discussion with patient after her first treatment. She has had no change in her well being and denies side effects. Patient denies questions. Will continue to monitor patient weekly during treatment.

## 2016-01-01 NOTE — Progress Notes (Signed)
Oncology Nurse Navigator Documentation  To provide support, encouragement and care continuity, met with Ms. Stenberg after her initial XRT.  She was accompanied by her husband.  She denied difficulty with treatment.  I escorted her to [facility] ED with RN Anderson Malta. They understand I can be contacted with needs/concerns.  Gayleen Orem, RN, BSN, [place] [facility] Oncology Nurse [place] at [place] [phone]

## 2016-01-02 ENCOUNTER — Ambulatory Visit
Admission: RE | Admit: 2016-01-02 | Discharge: 2016-01-02 | Disposition: A | Discharge status: Home / Self Care | Payer: 59 | Source: Ambulatory Visit | Attending: Radiation Oncology | Admitting: Radiation Oncology

## 2016-01-02 DIAGNOSIS — Z510 Encounter for antineoplastic radiation therapy: Secondary | ICD-10-CM | POA: Diagnosis not present

## 2016-01-05 ENCOUNTER — Ambulatory Visit
Admission: RE | Admit: 2016-01-05 | Discharge: 2016-01-05 | Disposition: A | Discharge status: Home / Self Care | Payer: 59 | Source: Ambulatory Visit | Attending: Radiation Oncology | Admitting: Radiation Oncology

## 2016-01-05 ENCOUNTER — Encounter: Payer: Self-pay | Admitting: Radiation Oncology

## 2016-01-05 VITALS — BP 119/83 | HR 81 | Temp 98.3°F | Ht 64.0 in | Wt 208.4 lb

## 2016-01-05 DIAGNOSIS — C8331 Diffuse large B-cell lymphoma, lymph nodes of head, face, and neck: Secondary | ICD-10-CM

## 2016-01-05 DIAGNOSIS — Z510 Encounter for antineoplastic radiation therapy: Secondary | ICD-10-CM | POA: Diagnosis not present

## 2016-01-05 NOTE — Progress Notes (Signed)
Ms. Spiva presents for her 3rd fraction of radiation to her Oropharynx. She denies pain or fatigue. She reports some soreness to her neck when moving at times. She will occasionally take alleve for this pain. She did have a slight fever yesterday of 99.8 and took an alleve for this pain. She denies any skin issues at this time. She continues to use biafine twice daily. She reports that she is eating well.   BP 119/83   Pulse 81   Temp 98.3 F (36.8 C)   Ht 5\' 4"  (1.626 m)   Wt 208 lb 6.4 oz (94.5 kg)   LMP 10/09/2011 (Approximate)   SpO2 100% Comment: room air  BMI 35.77 kg/m    Wt Readings from Last 3 Encounters:  01/05/16 208 lb 6.4 oz (94.5 kg)  12/23/15 206 lb 6.4 oz (93.6 kg)  12/16/15 207 lb 3.2 oz (94 kg)

## 2016-01-05 NOTE — Progress Notes (Signed)
   Weekly Management Note:  Outpatient    ICD-9-CM ICD-10-CM   1. Diffuse large B-cell lymphoma of lymph nodes of neck (HCC) 202.81 C83.31     Current Dose:  5.4 Gy  Projected Dose: 30.6 Gy   Narrative:  The patient presents for routine under treatment assessment.  CBCT/MVCT images/Port film x-rays were reviewed.  The chart was checked. She is doing well. Had a low grade temp of 99.8 yesterday, resolved with Aleve.   Physical Findings:  Wt Readings from Last 3 Encounters:  01/05/16 208 lb 6.4 oz (94.5 kg)  12/23/15 206 lb 6.4 oz (93.6 kg)  12/16/15 207 lb 3.2 oz (94 kg)    height is 5\' 4"  (1.626 m) and weight is 208 lb 6.4 oz (94.5 kg). Her temperature is 98.3 F (36.8 C). Her blood pressure is 119/83 and her pulse is 81. Her oxygen saturation is 100%.  NAD, no oral mucositis, no skin irritation over neck   CBC    Component Value Date/Time   WBC 2.6 (L) 12/15/2015 0908   WBC 4.5 10/09/2015 0730   RBC 3.81 12/15/2015 0908   RBC 4.63 10/09/2015 0730   HGB 11.0 (L) 12/15/2015 0908   HCT 33.6 (L) 12/15/2015 0908   PLT 311 12/15/2015 0908   MCV 88.1 12/15/2015 0908   MCH 28.7 12/15/2015 0908   MCH 28.3 10/09/2015 0730   MCHC 32.6 12/15/2015 0908   MCHC 32.5 10/09/2015 0730   RDW 17.3 (H) 12/15/2015 0908   LYMPHSABS 0.9 12/15/2015 0908   MONOABS 0.6 12/15/2015 0908   EOSABS 0.1 12/15/2015 0908   BASOSABS 0.0 12/15/2015 0908     CMP     Component Value Date/Time   NA 141 12/15/2015 0908   K 3.9 12/15/2015 0908   CL 103 10/09/2015 0900   CO2 25 12/15/2015 0908   GLUCOSE 111 12/15/2015 0908   BUN 16.0 12/15/2015 0908   CREATININE 0.7 12/15/2015 0908   CALCIUM 9.0 12/15/2015 0908   PROT 6.8 12/15/2015 0908   ALBUMIN 3.3 (L) 12/15/2015 0908   AST 21 12/15/2015 0908   ALT 25 12/15/2015 0908   ALKPHOS 67 12/15/2015 0908   BILITOT 0.25 12/15/2015 0908   GFRNONAA >60 10/09/2015 0900   GFRAA >60 10/09/2015 0900     Impression:  The patient is tolerating radiotherapy.    Plan:  Continue radiotherapy as planned.  -----------------------------------  Eppie Gibson, MD

## 2016-01-06 ENCOUNTER — Telehealth: Payer: Self-pay | Admitting: *Deleted

## 2016-01-06 ENCOUNTER — Ambulatory Visit
Admission: RE | Admit: 2016-01-06 | Discharge: 2016-01-06 | Disposition: A | Discharge status: Home / Self Care | Payer: 59 | Source: Ambulatory Visit | Attending: Radiation Oncology | Admitting: Radiation Oncology

## 2016-01-06 DIAGNOSIS — Z510 Encounter for antineoplastic radiation therapy: Secondary | ICD-10-CM | POA: Diagnosis not present

## 2016-01-06 NOTE — Telephone Encounter (Signed)
Call from [place] that pt asking about having her PAC flushed.  It is due to be flushed.  Dr. Alvy Bimler requests labs be done w/ PAC flush this week.   Scheduling message sent to add Lab/flush after XRT this Thursday.  LVM for pt informing her of this and to call nurse back if any questions.

## 2016-01-07 ENCOUNTER — Encounter: Payer: Self-pay | Admitting: Nutrition

## 2016-01-07 ENCOUNTER — Ambulatory Visit
Admission: RE | Admit: 2016-01-07 | Discharge: 2016-01-07 | Disposition: A | Discharge status: Home / Self Care | Payer: 59 | Source: Ambulatory Visit | Attending: Radiation Oncology | Admitting: Radiation Oncology

## 2016-01-07 DIAGNOSIS — Z510 Encounter for antineoplastic radiation therapy: Secondary | ICD-10-CM | POA: Diagnosis not present

## 2016-01-07 NOTE — Progress Notes (Signed)
Patient did not show up for nutrition appointment. 

## 2016-01-08 ENCOUNTER — Other Ambulatory Visit (HOSPITAL_BASED_OUTPATIENT_CLINIC_OR_DEPARTMENT_OTHER): Payer: 59

## 2016-01-08 ENCOUNTER — Ambulatory Visit
Admission: RE | Admit: 2016-01-08 | Discharge: 2016-01-08 | Disposition: A | Discharge status: Home / Self Care | Payer: 59 | Source: Ambulatory Visit | Attending: Radiation Oncology | Admitting: Radiation Oncology

## 2016-01-08 ENCOUNTER — Ambulatory Visit: Payer: 59

## 2016-01-08 ENCOUNTER — Encounter: Payer: Self-pay | Admitting: *Deleted

## 2016-01-08 ENCOUNTER — Encounter: Payer: Self-pay | Admitting: Hematology and Oncology

## 2016-01-08 ENCOUNTER — Ambulatory Visit (HOSPITAL_BASED_OUTPATIENT_CLINIC_OR_DEPARTMENT_OTHER): Payer: 59 | Admitting: Hematology and Oncology

## 2016-01-08 VITALS — BP 121/77 | HR 79 | Temp 99.3°F | Resp 18

## 2016-01-08 DIAGNOSIS — C8331 Diffuse large B-cell lymphoma, lymph nodes of head, face, and neck: Secondary | ICD-10-CM

## 2016-01-08 DIAGNOSIS — R110 Nausea: Secondary | ICD-10-CM | POA: Diagnosis not present

## 2016-01-08 DIAGNOSIS — T451X5A Adverse effect of antineoplastic and immunosuppressive drugs, initial encounter: Principal | ICD-10-CM

## 2016-01-08 DIAGNOSIS — R634 Abnormal weight loss: Secondary | ICD-10-CM

## 2016-01-08 DIAGNOSIS — R509 Fever, unspecified: Secondary | ICD-10-CM | POA: Diagnosis not present

## 2016-01-08 DIAGNOSIS — Z1329 Encounter for screening for other suspected endocrine disorder: Secondary | ICD-10-CM

## 2016-01-08 DIAGNOSIS — Z510 Encounter for antineoplastic radiation therapy: Secondary | ICD-10-CM | POA: Diagnosis not present

## 2016-01-08 LAB — COMPREHENSIVE METABOLIC PANEL
ALBUMIN: 3.1 g/dL — AB (ref 3.5–5.0)
ALK PHOS: 70 U/L (ref 40–150)
ALT: 22 U/L (ref 0–55)
AST: 19 U/L (ref 5–34)
Anion Gap: 7 mEq/L (ref 3–11)
BUN: 12.3 mg/dL (ref 7.0–26.0)
CALCIUM: 9 mg/dL (ref 8.4–10.4)
CO2: 27 mEq/L (ref 22–29)
Chloride: 103 mEq/L (ref 98–109)
Creatinine: 0.7 mg/dL (ref 0.6–1.1)
Glucose: 98 mg/dl (ref 70–140)
POTASSIUM: 3.9 meq/L (ref 3.5–5.1)
Sodium: 137 mEq/L (ref 136–145)
Total Bilirubin: 0.47 mg/dL (ref 0.20–1.20)
Total Protein: 7 g/dL (ref 6.4–8.3)

## 2016-01-08 LAB — CBC WITH DIFFERENTIAL/PLATELET
BASO%: 0.4 % (ref 0.0–2.0)
Basophils Absolute: 0 10*3/uL (ref 0.0–0.1)
EOS%: 4.2 % (ref 0.0–7.0)
Eosinophils Absolute: 0.2 10*3/uL (ref 0.0–0.5)
HEMATOCRIT: 32.7 % — AB (ref 34.8–46.6)
HEMOGLOBIN: 10.7 g/dL — AB (ref 11.6–15.9)
LYMPH%: 17.4 % (ref 14.0–49.7)
MCH: 28.7 pg (ref 25.1–34.0)
MCHC: 32.8 g/dL (ref 31.5–36.0)
MCV: 87.5 fL (ref 79.5–101.0)
MONO#: 0.6 10*3/uL (ref 0.1–0.9)
MONO%: 11.6 % (ref 0.0–14.0)
NEUT#: 3.2 10*3/uL (ref 1.5–6.5)
NEUT%: 66.4 % (ref 38.4–76.8)
Platelets: 226 10*3/uL (ref 145–400)
RBC: 3.73 10*6/uL (ref 3.70–5.45)
RDW: 15.1 % — ABNORMAL HIGH (ref 11.2–14.5)
WBC: 4.8 10*3/uL (ref 3.9–10.3)
lymph#: 0.8 10*3/uL — ABNORMAL LOW (ref 0.9–3.3)

## 2016-01-08 LAB — TSH: TSH: 0.257 m[IU]/L — AB (ref 0.308–3.960)

## 2016-01-08 MED ORDER — SODIUM CHLORIDE 0.9 % IV SOLN: Freq: Once | INTRAVENOUS | Status: DC

## 2016-01-08 MED ORDER — HEPARIN SOD (PORK) LOCK FLUSH 100 UNIT/ML IV SOLN
250.0000 [IU] | Freq: Once | INTRAVENOUS | Status: DC | PRN
  Filled 2016-01-08: qty 5

## 2016-01-08 MED ORDER — HEPARIN SOD (PORK) LOCK FLUSH 100 UNIT/ML IV SOLN
500.0000 [IU] | Freq: Once | INTRAVENOUS | Status: AC | PRN
  Administered 2016-01-08: 500 [IU]
  Filled 2016-01-08: qty 5

## 2016-01-08 MED ORDER — HEPARIN SOD (PORK) LOCK FLUSH 100 UNIT/ML IV SOLN
500.0000 [IU] | INTRAVENOUS | Status: DC | PRN
  Filled 2016-01-08: qty 5

## 2016-01-08 MED ORDER — ALTEPLASE 2 MG IJ SOLR
2.0000 mg | Freq: Once | INTRAMUSCULAR | Status: DC | PRN
  Filled 2016-01-08: qty 2

## 2016-01-08 MED ORDER — PROMETHAZINE HCL 25 MG/ML IJ SOLN
25.0000 mg | Freq: Once | INTRAMUSCULAR | Status: DC
  Filled 2016-01-08: qty 1

## 2016-01-08 MED ORDER — SODIUM CHLORIDE 0.9 % IJ SOLN
10.0000 mL | INTRAMUSCULAR | Status: AC | PRN
  Administered 2016-01-08: 10 mL
  Filled 2016-01-08: qty 10

## 2016-01-08 MED ORDER — SODIUM CHLORIDE 0.9 % IJ SOLN
10.0000 mL | INTRAMUSCULAR | Status: DC | PRN
Start: 2016-01-08 — End: 2016-01-08
  Filled 2016-01-08: qty 10

## 2016-01-08 NOTE — Assessment & Plan Note (Signed)
Cause is unknown but could be due to radiation. I recommend her to continue to take anti-emetics as needed

## 2016-01-08 NOTE — Patient Instructions (Signed)

## 2016-01-08 NOTE — Progress Notes (Signed)
Pt reports "fever ranging from 99.4 to 100" over the last three days, along with Nausea. And a headache that had an onset this morning. Cameo RN aware whom will discuss with Dr. Alvy Bimler.

## 2016-01-08 NOTE — Assessment & Plan Note (Addendum)
She is undergoing radiation treatment. Continue the same.

## 2016-01-08 NOTE — Progress Notes (Signed)
Oncology Nurse Navigator Documentation  To provide support, encouragement and care continuity, met with Ms. Charlotte Davidson following scheduled XRT. She stated she is tolerating tmts without difficulty. She expressed concern re fever of 100.1 last HS for which she took Tylenol.  She has not taken her temp this morning. She indicated she has labs/flush after today's XRT.  I encouraged her to communicate her concern to Dr. Alvy Bimler via flush RN. She denied needs, additional concerns, understands I can be contacted.  Gayleen Orem, RN, BSN, [place] [facility]ck Oncology Nurse [place] at [place] [phone]

## 2016-01-08 NOTE — Progress Notes (Signed)
[facility] OFFICE PROGRESS NOTE  Patient Care Team: Richard W Tisovec, MD as PCP - General (Internal Medicine) Ni Gorsuch, MD as Consulting Physician (Hematology and Oncology) Karol Wolicki, MD as Consulting Physician (Otolaryngology) Sarah Squire, MD as Attending Physician (Radiation Oncology) Richard A Diehl, RN as Oncology Nurse Navigator (Oncology) Barbara L Neff, RD as Dietitian (Nutrition)  SUMMARY OF ONCOLOGIC HISTORY:   Diffuse large B-cell lymphoma of lymph nodes of neck (HCC)   09/24/2015 Pathology Results    Final Cytologic Interpretation [id number]: Right level II neck mass, Fine Needle Aspiration I (smears and Thinprep): Malignant cells present, most consistent with large cell lymphoma.       09/24/2015 Pathology Results    [id number] The nasopharyngeal biopsy shows lymphoid tissue with diffuse effacement of the normal nodal architecture by large cells with vesicular nuclei, prominent nucleoli, and scant cytoplasm. The large cells are positive for CD20, BCL-6, BCL-2, CD5, and MUM1 by immunohistochemistry. They are negative for CD30, CD3, CD10, cyclinD1 and SOX11. In situ hybridization for EBV mRNA is negative. Ki-67 reveals a proliferation rate of approximately 60%. By flow cytometry, the atypical cells express CD19, CD5, and CD23 (see below). The expression of CD5 and CD23 by flow cytometry is suggestive of small lymphocytic lymphoma/ chronic lymphocytic leukemia (SLL/CLL) transforming to a diffuse large B-cell lymphoma (DLBCL); however, there was no evidence of SLL/CLL in the flow sample. Correlation with peripheral blood/ marrow findings would be useful to determine if this lymphoma arose from background SLL/CLL or is a de novo CD5+ DLBCL. De novo CD5+ DLBCL is frequently associated with an aggressive clinical course and poor response to chemotherapy. A pleomorphic mantle cell lymphoma is less likely since the cells are negative for Cyclin D1 and SOX11. FISH studies  are pending and will be reported as an addendum.       09/30/2015 Imaging    CT scan of neck showed bulky RIGHT neck lymphadenopathy concerning for metastatic disease. 8 mm submucosal RIGHT base of tongue mass, considering ipsilateral lymphadenopathy, this is suspicious for primary head and neck cancer. Isodense LEFT palatine tonsillar mass versus focal lymphoid hyperplasia resulting in LEFT postobstructive middle ear/mastoid effusion.       10/03/2015 Imaging    ECHO showed normal EF      10/08/2015 PET scan    Hypermetabolic left nasopharyngeal and tongue base masses with hypermetabolic bilateral neck adenopathy. Right level 2 adenopathy extends inferiorly into the low right internal jugular station. No additional evidence of metastatic disease in the chest, abdomen or pelvis.      10/09/2015 Procedure    Successful placement of a left internal jugular approach power injectable Port-A-Cath. The catheter is ready for immediate use.      10/13/2015 - 11/24/2015 Chemotherapy    She received 3 cycles of chemotherapy with R-CHOP      12/15/2015 PET scan    Interval complete resolution of hypermetabolic activity associated with the left tonsillar mass and multiple cervical lymph nodes consistent with complete remission. No residual hypermetabolic activity demonstrated.       INTERVAL HISTORY: Please see below for problem oriented charting. Shee requested urgent evaluation due to recent low-grade fever of 99-100, recent nausea without vomiting and mild bone aches. Denies recent cough, dysuria, frequency or urgency. Denies mucositis She feels weak overall  REVIEW OF SYSTEMS:   Eyes: Denies blurriness of vision Ears, nose, mouth, throat, and face: Denies mucositis or sore throat Respiratory: Denies cough, dyspnea or wheezes Cardiovascular: Denies palpitation, chest discomfort   or lower extremity swelling Skin: Denies abnormal skin rashes Lymphatics: Denies new lymphadenopathy or easy  bruising Neurological:Denies numbness, tingling or new weaknesses Behavioral/Psych: Mood is stable, no new changes  All other systems were reviewed with the patient and are negative.  I have reviewed the past medical history, past surgical history, social history and family history with the patient and they are unchanged from previous note.  ALLERGIES:  is allergic to latex; penicillins; and sulfa antibiotics.  MEDICATIONS:  Current Outpatient Prescriptions  Medication Sig Dispense Refill  . acetaminophen (TYLENOL) 325 MG tablet Take 650 mg by mouth every 6 (six) hours as needed.    . Ascorbic Acid (VITAMIN C PO) Take 1 tablet by mouth daily.    . Cholecalciferol (VITAMIN D PO) Take 1 tablet by mouth daily.    . lidocaine-prilocaine (EMLA) cream Apply to affected area once 30 g 3  . Multiple Vitamins-Minerals (MULTIVITAMIN WITH MINERALS) tablet Take 1 tablet by mouth daily.    . naproxen sodium (ANAPROX) 220 MG tablet Take 220 mg by mouth 2 (two) times daily as needed (for pain).    . ondansetron (ZOFRAN) 8 MG tablet Take 1 tablet (8 mg total) by mouth every 8 (eight) hours as needed for refractory nausea / vomiting. 30 tablet 1  . prochlorperazine (COMPAZINE) 10 MG tablet Take 1 tablet (10 mg total) by mouth every 6 (six) hours as needed (Nausea or vomiting). 30 tablet 6  . sodium fluoride (FLUORISHIELD) 1.1 % GEL dental gel Instill one drop of gel per tooth space of fluoride tray. Place over teeth for 5 minutes. Remove. Spit out excess. Repeat nightly. 120 mL prn   No current facility-administered medications for this visit.    Facility-Administered Medications Ordered in Other Visits  Medication Dose Route Frequency Provider Last Rate Last Dose  . 0.9 %  sodium chloride infusion   Intravenous Once Ni Gorsuch, MD      . alteplase (CATHFLO ACTIVASE) injection 2 mg  2 mg Intracatheter Once PRN Ni Gorsuch, MD      . heparin lock flush 100 unit/mL  500 Units Intracatheter PRN Ni Gorsuch, MD       . heparin lock flush 100 unit/mL  250 Units Intracatheter Once PRN Ni Gorsuch, MD      . promethazine (PHENERGAN) injection 25 mg  25 mg Intravenous Once Ni Gorsuch, MD      . sodium chloride 0.9 % injection 10 mL  10 mL Intracatheter PRN Ni Gorsuch, MD        PHYSICAL EXAMINATION: ECOG PERFORMANCE STATUS: 1 - Symptomatic but completely ambulatory  Vitals:   01/08/16 1057  BP: 121/77  Pulse: 79  Resp: 18  Temp: 99.3 F (37.4 C)   Filed Weights   01/08/16 1057  Weight: 208 lb 3.2 oz (94.4 kg)    GENERAL:alert, no distress and comfortable SKIN: skin color, texture, turgor are normal, no rashes or significant lesions EYES: normal, Conjunctiva are pink and non-injected, sclera clear OROPHARYNX:no exudate, no erythema and lips, buccal mucosa, and tongue normal  NECK: supple, thyroid normal size, non-tender, without nodularity LYMPH:  no palpable lymphadenopathy in the cervical, axillary or inguinal LUNGS: clear to auscultation and percussion with normal breathing effort HEART: regular rate & rhythm and no murmurs and no lower extremity edema ABDOMEN:abdomen soft, non-tender and normal bowel sounds Musculoskeletal:no cyanosis of digits and no clubbing  NEURO: alert & oriented x 3 with fluent speech, no focal motor/sensory deficits  LABORATORY DATA:  I have reviewed   the data as listed    Component Value Date/Time   NA 137 01/08/2016 0905   K 3.9 01/08/2016 0905   CL 103 10/09/2015 0900   CO2 27 01/08/2016 0905   GLUCOSE 98 01/08/2016 0905   BUN 12.3 01/08/2016 0905   CREATININE 0.7 01/08/2016 0905   CALCIUM 9.0 01/08/2016 0905   PROT 7.0 01/08/2016 0905   ALBUMIN 3.1 (L) 01/08/2016 0905   AST 19 01/08/2016 0905   ALT 22 01/08/2016 0905   ALKPHOS 70 01/08/2016 0905   BILITOT 0.47 01/08/2016 0905   GFRNONAA >60 10/09/2015 0900   GFRAA >60 10/09/2015 0900    No results found for: SPEP, UPEP  Lab Results  Component Value Date   WBC 4.8 01/08/2016   NEUTROABS 3.2  01/08/2016   HGB 10.7 (L) 01/08/2016   HCT 32.7 (L) 01/08/2016   MCV 87.5 01/08/2016   PLT 226 01/08/2016      Chemistry      Component Value Date/Time   NA 137 01/08/2016 0905   K 3.9 01/08/2016 0905   CL 103 10/09/2015 0900   CO2 27 01/08/2016 0905   BUN 12.3 01/08/2016 0905   CREATININE 0.7 01/08/2016 0905      Component Value Date/Time   CALCIUM 9.0 01/08/2016 0905   ALKPHOS 70 01/08/2016 0905   AST 19 01/08/2016 0905   ALT 22 01/08/2016 0905   BILITOT 0.47 01/08/2016 0905       RADIOGRAPHIC STUDIES: I have personally reviewed the radiological images as listed and agreed with the findings in the report. Nm Pet Image Restag (ps) Skull Base To Thigh  Result Date: 12/15/2015 CLINICAL DATA:  Subsequent treatment strategy for lymphoma. Large B-cell lymphoma post chemotherapy. EXAM: NUCLEAR MEDICINE PET SKULL BASE TO THIGH TECHNIQUE: 9.2 mCi F-18 FDG was injected intravenously. Full-ring PET imaging was performed from the skull base to thigh after the radiotracer. CT data was obtained and used for attenuation correction and anatomic localization. FASTING BLOOD GLUCOSE:  Value: 117 mg/dl COMPARISON:  PET-CT 10/08/2015 FINDINGS: NECK No residual hypermetabolic cervical lymph nodes are identified. The previously demonstrated enlarged cervical lymph nodes bilaterally are no longer seen. There is no residual hypermetabolic activity within the nasopharyngeal space.The thyroid gland demonstrates no abnormal activity. CHEST There are no hypermetabolic mediastinal, hilar or axillary lymph nodes. There is no hypermetabolic pulmonary activity. 3 mm left lower lobe pulmonary nodule on image 69 is stable. The lungs are otherwise clear. Left IJ Port-A-Cath extends to the SVC right atrial junction. ABDOMEN/PELVIS There is no hypermetabolic activity within the liver, adrenal glands, spleen or pancreas. There is no hypermetabolic nodal activity. SKELETON There is no hypermetabolic activity to suggest  osseous metastatic disease. IMPRESSION: Interval complete resolution of hypermetabolic activity associated with the left tonsillar mass and multiple cervical lymph nodes consistent with complete remission. No residual hypermetabolic activity demonstrated. Electronically Signed   By: William  Veazey M.D.   On: 12/15/2015 09:00    ASSESSMENT & PLAN:  Diffuse large B-cell lymphoma of lymph nodes of neck (HCC) She is undergoing radiation treatment. Continue the same.  Low grade fever She has recent low-grade fever. Examination is benign. She is not neutropenic. I suspect it could be related to radiation treatment or mild viral infection. Recommend conservative management only  Nausea without vomiting Cause is unknown but could be due to radiation. I recommend her to continue to take anti-emetics as needed   No orders of the defined types were placed in this encounter.  All questions   were answered. The patient knows to call the clinic with any problems, questions or concerns. No barriers to learning was detected. I spent 15 minutes counseling the patient face to face. The total time spent in the appointment was 20 minutes and more than 50% was on counseling and review of test results     Heath Lark, MD 01/08/2016 11:12 AM

## 2016-01-08 NOTE — Assessment & Plan Note (Signed)
She has recent low-grade fever. Examination is benign. She is not neutropenic. I suspect it could be related to radiation treatment or mild viral infection. Recommend conservative management only

## 2016-01-08 NOTE — Progress Notes (Signed)
Patient taken to collab side by Moshe Salisbury RN for visit with Dr. Alvy Bimler.

## 2016-01-09 ENCOUNTER — Other Ambulatory Visit: Payer: Self-pay | Admitting: Radiation Oncology

## 2016-01-09 ENCOUNTER — Ambulatory Visit
Admission: RE | Admit: 2016-01-09 | Discharge: 2016-01-09 | Disposition: A | Discharge status: Home / Self Care | Payer: 59 | Source: Ambulatory Visit | Attending: Radiation Oncology | Admitting: Radiation Oncology

## 2016-01-09 DIAGNOSIS — C8331 Diffuse large B-cell lymphoma, lymph nodes of head, face, and neck: Secondary | ICD-10-CM

## 2016-01-09 DIAGNOSIS — Z510 Encounter for antineoplastic radiation therapy: Secondary | ICD-10-CM | POA: Diagnosis not present

## 2016-01-09 DIAGNOSIS — Z1329 Encounter for screening for other suspected endocrine disorder: Secondary | ICD-10-CM

## 2016-01-10 LAB — T4, FREE: FREE T4: 1.29 ng/dL (ref 0.82–1.77)

## 2016-01-12 ENCOUNTER — Ambulatory Visit
Admission: RE | Admit: 2016-01-12 | Discharge: 2016-01-12 | Disposition: A | Discharge status: Home / Self Care | Payer: 59 | Source: Ambulatory Visit | Attending: Radiation Oncology | Admitting: Radiation Oncology

## 2016-01-12 ENCOUNTER — Encounter: Payer: Self-pay | Admitting: Radiation Oncology

## 2016-01-12 VITALS — Temp 98.3°F | Ht 64.0 in | Wt 205.6 lb

## 2016-01-12 DIAGNOSIS — Z510 Encounter for antineoplastic radiation therapy: Secondary | ICD-10-CM | POA: Diagnosis not present

## 2016-01-12 DIAGNOSIS — C8331 Diffuse large B-cell lymphoma, lymph nodes of head, face, and neck: Secondary | ICD-10-CM

## 2016-01-12 MED ORDER — LIDOCAINE VISCOUS 2 % MT SOLN: OROMUCOSAL | 5 refills | Status: DC

## 2016-01-12 MED ORDER — SUCRALFATE 1 G PO TABS: ORAL_TABLET | ORAL | 5 refills | Status: DC

## 2016-01-12 MED FILL — LIDOCAINE 2% VISCOUS SOLN: 2 | 4 days supply | Qty: 100 | Fill #0

## 2016-01-12 MED FILL — SUCRALFATE 1 GM TABLET: 1 | 15 days supply | Qty: 60 | Fill #0

## 2016-01-12 NOTE — Progress Notes (Signed)
   Weekly Management Note:  Outpatient    ICD-9-CM ICD-10-CM   1. Diffuse large B-cell lymphoma of lymph nodes of neck (HCC) 202.81 C83.31     Current Dose:  14.4 Gy  Projected Dose: 30.6 Gy   Narrative:  The patient presents for routine under treatment assessment.  CBCT/MVCT images/Port film x-rays were reviewed.  The chart was checked. She is having symptoms of throat soreness and dysgeusia and reduced PO intake.  Orthostatics: BP sitting 132/81 pulse 69. BP standing 110/73 pulse 87  Physical Findings:  Wt Readings from Last 3 Encounters:  01/12/16 205 lb 9.6 oz (93.3 kg)  01/08/16 208 lb 3.2 oz (94.4 kg)  01/05/16 208 lb 6.4 oz (94.5 kg)    height is 5\' 4"  (1.626 m) and weight is 205 lb 9.6 oz (93.3 kg). Her temperature is 98.3 F (36.8 C).   NAD, no visible oral mucositis, no skin irritation over neck   CBC    Component Value Date/Time   WBC 4.8 01/08/2016 0905   WBC 4.5 10/09/2015 0730   RBC 3.73 01/08/2016 0905   RBC 4.63 10/09/2015 0730   HGB 10.7 (L) 01/08/2016 0905   HCT 32.7 (L) 01/08/2016 0905   PLT 226 01/08/2016 0905   MCV 87.5 01/08/2016 0905   MCH 28.7 01/08/2016 0905   MCH 28.3 10/09/2015 0730   MCHC 32.8 01/08/2016 0905   MCHC 32.5 10/09/2015 0730   RDW 15.1 (H) 01/08/2016 0905   LYMPHSABS 0.8 (L) 01/08/2016 0905   MONOABS 0.6 01/08/2016 0905   EOSABS 0.2 01/08/2016 0905   BASOSABS 0.0 01/08/2016 0905     CMP     Component Value Date/Time   NA 137 01/08/2016 0905   K 3.9 01/08/2016 0905   CL 103 10/09/2015 0900   CO2 27 01/08/2016 0905   GLUCOSE 98 01/08/2016 0905   BUN 12.3 01/08/2016 0905   CREATININE 0.7 01/08/2016 0905   CALCIUM 9.0 01/08/2016 0905   PROT 7.0 01/08/2016 0905   ALBUMIN 3.1 (L) 01/08/2016 0905   AST 19 01/08/2016 0905   ALT 22 01/08/2016 0905   ALKPHOS 70 01/08/2016 0905   BILITOT 0.47 01/08/2016 0905   GFRNONAA >60 10/09/2015 0900   GFRAA >60 10/09/2015 0900    Impression:  The patient is tolerating radiotherapy.    Plan:  Continue radiotherapy as planned. Rx sucralfate and lidocaine for esophagitis.  Refer back to nutritionist.  Pt advised to contact us if she shows signs of dehydration / dizziness.  Push oral nutrition and water.   -----------------------------------  Eppie Gibson, MD

## 2016-01-12 NOTE — Progress Notes (Signed)
Ms. Delin is here for her 8th fraction of radiation to her Oropharynx. She reports pain a 5/10 in her Throat when swallowing. She has a decreased appetite. She is not drinking well either. She reports food and liquids do not taste well, which has decreased her desire to eat. She started drinking nutritional supplements today.  She is using sonafine twice daily. The skin to her Radiaiton site is normal appearing and intact.   Temp 98.3 F (36.8 C)   Ht 5\' 4"  (1.626 m)   Wt 205 lb 9.6 oz (93.3 kg)   LMP 10/09/2011 (Approximate)   BMI 35.29 kg/m    Orthostatics: BP sitting 132/81 pulse 69. BP standing 110/73 pulse 87  Wt Readings from Last 3 Encounters:  01/12/16 205 lb 9.6 oz (93.3 kg)  01/08/16 208 lb 3.2 oz (94.4 kg)  01/05/16 208 lb 6.4 oz (94.5 kg)

## 2016-01-13 ENCOUNTER — Ambulatory Visit
Admission: RE | Admit: 2016-01-13 | Discharge: 2016-01-13 | Disposition: A | Discharge status: Home / Self Care | Payer: 59 | Source: Ambulatory Visit | Attending: Radiation Oncology | Admitting: Radiation Oncology

## 2016-01-13 DIAGNOSIS — Z510 Encounter for antineoplastic radiation therapy: Secondary | ICD-10-CM | POA: Diagnosis not present

## 2016-01-14 ENCOUNTER — Ambulatory Visit
Admission: RE | Admit: 2016-01-14 | Discharge: 2016-01-14 | Disposition: A | Discharge status: Home / Self Care | Payer: 59 | Source: Ambulatory Visit | Attending: Radiation Oncology | Admitting: Radiation Oncology

## 2016-01-14 ENCOUNTER — Encounter: Payer: Self-pay | Admitting: Nutrition

## 2016-01-14 DIAGNOSIS — Z510 Encounter for antineoplastic radiation therapy: Secondary | ICD-10-CM | POA: Diagnosis not present

## 2016-01-15 ENCOUNTER — Ambulatory Visit
Admission: RE | Admit: 2016-01-15 | Discharge: 2016-01-15 | Disposition: A | Discharge status: Home / Self Care | Payer: 59 | Source: Ambulatory Visit | Attending: Radiation Oncology | Admitting: Radiation Oncology

## 2016-01-15 DIAGNOSIS — Z510 Encounter for antineoplastic radiation therapy: Secondary | ICD-10-CM | POA: Diagnosis not present

## 2016-01-16 ENCOUNTER — Ambulatory Visit
Admission: RE | Admit: 2016-01-16 | Discharge: 2016-01-16 | Disposition: A | Discharge status: Home / Self Care | Payer: 59 | Source: Ambulatory Visit | Attending: Radiation Oncology | Admitting: Radiation Oncology

## 2016-01-16 DIAGNOSIS — Z510 Encounter for antineoplastic radiation therapy: Secondary | ICD-10-CM | POA: Diagnosis not present

## 2016-01-16 MED FILL — LIDOCAINE 2% VISCOUS SOLN: 2 | 4 days supply | Qty: 100 | Fill #1

## 2016-01-20 ENCOUNTER — Ambulatory Visit
Admission: RE | Admit: 2016-01-20 | Discharge: 2016-01-20 | Disposition: A | Discharge status: Home / Self Care | Payer: 59 | Source: Ambulatory Visit | Attending: Radiation Oncology | Admitting: Radiation Oncology

## 2016-01-20 ENCOUNTER — Telehealth: Payer: Self-pay

## 2016-01-20 ENCOUNTER — Encounter: Payer: Self-pay | Admitting: Radiation Oncology

## 2016-01-20 ENCOUNTER — Other Ambulatory Visit: Payer: Self-pay | Admitting: Radiation Oncology

## 2016-01-20 VITALS — BP 123/81 | HR 99 | Temp 98.5°F | Resp 18 | Ht 64.0 in | Wt 199.6 lb

## 2016-01-20 DIAGNOSIS — Z510 Encounter for antineoplastic radiation therapy: Secondary | ICD-10-CM | POA: Diagnosis not present

## 2016-01-20 DIAGNOSIS — C8331 Diffuse large B-cell lymphoma, lymph nodes of head, face, and neck: Secondary | ICD-10-CM

## 2016-01-20 LAB — BASIC METABOLIC PANEL
ANION GAP: 8 meq/L (ref 3–11)
BUN: 22.4 mg/dL (ref 7.0–26.0)
CHLORIDE: 104 meq/L (ref 98–109)
CO2: 29 mEq/L (ref 22–29)
CREATININE: 0.8 mg/dL (ref 0.6–1.1)
Calcium: 9.4 mg/dL (ref 8.4–10.4)
EGFR: >90 mL/min/{1.73_m2} (ref >90)
Glucose: 109 mg/dl (ref 70–140)
POTASSIUM: 4.1 meq/L (ref 3.5–5.1)
SODIUM: 141 meq/L (ref 136–145)

## 2016-01-20 MED ORDER — HYDROCODONE-ACETAMINOPHEN 7.5-325 MG/15ML PO SOLN: ORAL | 0 refills | Status: DC

## 2016-01-20 MED FILL — HYDROCOD-APAP 7.5-325/15ML: 7.5-325 | 6 days supply | Qty: 473 | Fill #0

## 2016-01-20 NOTE — Progress Notes (Addendum)
Charlotte Davidson is here for her 8th fraction of radiation to her Oropharynx. She reports pain a 6/10 in her Throat when swallowing. She has a decreased appetite. She is not drinking well either. She reports food and liquids do not taste well, which has decreased her desire to eat. She started drinking nutritional supplements Ensure and Boost.  She is using sonafine twice daily. The skin to her Radiaiton site is normal appearing and intact with tanning. Wt Readings from Last 3 Encounters:  01/20/16 199 lb 9.6 oz (90.5 kg)  01/12/16 205 lb 9.6 oz (93.3 kg)  01/08/16 208 lb 3.2 oz (94.4 kg)  BP 123/83   Pulse 86   Temp 98.5 F (36.9 C) (Oral)   Resp 18   Ht 5\' 4"  (1.626 m)   Wt 199 lb 9.6 oz (90.5 kg)   LMP 10/09/2011 (Approximate)   SpO2 100%   BMI 34.26 kg/m sitting BP 123/81   Pulse 99   Temp 98.5 F (36.9 C) (Oral)   Resp 18   Ht 5\' 4"  (1.626 m)   Wt 199 lb 9.6 oz (90.5 kg)   LMP 10/09/2011 (Approximate)   SpO2 100%   BMI 34.26 kg/m  standing Weight loss of 6 pounds in the past week

## 2016-01-20 NOTE — Telephone Encounter (Signed)
I called Charlotte Davidson at the request of Dr. Isidore Moos and informed her that the labs she had drawn today show that she is dehydrated. She can avoid IV Fluids if she is able to push fluids by mouth today and tonight. I asked her to stop by the lab tomorrow before or after her radiation to have her labs redrawn. Dr. Isidore Moos will wait for the results and we will call her tomorrow if she will need IV Fluids later in the week.

## 2016-01-20 NOTE — Progress Notes (Signed)
   Weekly Management Note:  Outpatient    ICD-9-CM ICD-10-CM   1. 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  CBCT/MVCT images/Port film x-rays were reviewed.  The chart was checked.   Ms. Grabbe is here for her 8th fraction of radiation to her Oropharynx. She reports pain a 6/10 in her throat when swallowing. She has a decreased appetite. She is not drinking well either. She reports food and liquids do not taste well, which has decreased her desire to eat. She started drinking nutritional supplements Ensure and Boost. She is using sonafine twice daily. Nursing notes, the skin to her radiation site is normal appearing and intact with tanning. She last met with nutritionist Ernestene Kiel on 01/07/16. She notes her nose feels dry.   Physical Findings:  height is _0  (1.626 m) and weight is 199 lb 9.6 oz (90.5 kg). Her oral temperature is 98.5 F (36.9 C). Her blood pressure is 123/81 and her pulse is 99. Her respiration is 18 and oxygen saturation is 100%.   Wt Readings from Last 3 Encounters:  01/20/16 199 lb 9.6 oz (90.5 kg)  01/12/16 205 lb 9.6 oz (93.3 kg)  01/08/16 208 lb 3.2 oz (94.4 kg)   Weight loss of 9 lbs since 01/08/16. Oral cavity has thick saliva and white coating on tongue that doesn't appear to be thrush. No other obvious mucositis. Skin over the neck is smooth and intact without any obvious radiation changes.  Impression:  The patient is tolerating radiotherapy.  Plan:  Continue radiotherapy as planned. The patient will have labs drawn after our visit today due to decreased intake. She is scheduled to see nutritionist Ernestene Kiel tomorrow. If she is not doing well at her next weekly under treatment visit, or if BMP warrants today, we  will consider IV fluids.   I have prescribed Hycet 10-15 mL q 4 hrs prn for throat pain to help with intake. I encouraged the patient to take this with food and use Senokot PRN to avoid constipation while using this medication.   ________________________________   Eppie Gibson, M.D.   This document serves as a record of services personally performed by Eppie Gibson, MD. It was created on her behalf by Arlyce Harman, a trained medical scribe. The creation of this record is based on the scribe's personal observations and the provider's statements to them. This document has been checked and approved by the attending provider.

## 2016-01-21 ENCOUNTER — Other Ambulatory Visit: Payer: Self-pay | Admitting: Radiation Oncology

## 2016-01-21 ENCOUNTER — Ambulatory Visit
Admission: RE | Admit: 2016-01-21 | Discharge: 2016-01-21 | Disposition: A | Discharge status: Home / Self Care | Payer: 59 | Source: Ambulatory Visit | Attending: Radiation Oncology | Admitting: Radiation Oncology

## 2016-01-21 ENCOUNTER — Telehealth: Payer: Self-pay

## 2016-01-21 ENCOUNTER — Ambulatory Visit: Payer: 59

## 2016-01-21 ENCOUNTER — Ambulatory Visit: Payer: 59 | Admitting: Nutrition

## 2016-01-21 DIAGNOSIS — C8331 Diffuse large B-cell lymphoma, lymph nodes of head, face, and neck: Secondary | ICD-10-CM

## 2016-01-21 DIAGNOSIS — Z510 Encounter for antineoplastic radiation therapy: Secondary | ICD-10-CM | POA: Diagnosis not present

## 2016-01-21 LAB — BASIC METABOLIC PANEL WITH GFR
Anion Gap: 7 meq/L (ref 3–11)
BUN: 24 mg/dL (ref 7.0–26.0)
CO2: 28 meq/L (ref 22–29)
Calcium: 9.5 mg/dL (ref 8.4–10.4)
Chloride: 102 meq/L (ref 98–109)
Creatinine: 0.9 mg/dL (ref 0.6–1.1)
EGFR: 89 ml/min/1.73 m2 — ABNORMAL LOW
Glucose: 112 mg/dL (ref 70–140)
Potassium: 4.1 meq/L (ref 3.5–5.1)
Sodium: 137 meq/L (ref 136–145)

## 2016-01-21 NOTE — Progress Notes (Signed)
Nutrition follow-up completed with patient diagnosed with B-cell lymphoma of the nasopharynx, tongue and neck.  Weight was documented as 199 pounds decreased from 206.4 pounds November 28. Labs were reviewed and are within normal limits. Patient reports taste alterations. Reports painful throat. She tries to drink 3 boost plus or Ensure Plus daily. She is not eating a lot of food or drinking a lot of water.  Nutrition diagnosis:  Food and nutrition related knowledge deficit continues  Intervention: Educated patient on strategies for increasing calories, protein and fluid intake. Encouraged patient to increase Ensure Plus or equivalent 4 times a day Provided one complementary case of Ensure Plus along with coupons. Reviewed foods that could increase hydration and provided fact sheets. Questions were answered.  Teach back method used.  Monitoring, evaluation, goals:  Patient will increase calories and protein and fluid intake to minimize weight loss treatment.  Next visit: Tuesday, January 2.  After radiation therapy.  **Disclaimer: This note was dictated with voice recognition software. Similar sounding words can inadvertently be transcribed and this note may contain transcription errors which may not have been corrected upon publication of note.**

## 2016-01-21 NOTE — Telephone Encounter (Signed)
I called to inform Charlotte Davidson that Dr. Isidore Moos would like for her to receive IV Fluids this Friday 01/23/16 and 01/27/16 and 01/30/16. I have called Charlotte Davidson and informed her of the appointments scheduled for her in symptom management for IV Fluids. She voiced her understanding. I also asked her to continue to push fluids orally as much as she can.

## 2016-01-22 ENCOUNTER — Ambulatory Visit
Admission: RE | Admit: 2016-01-22 | Discharge: 2016-01-22 | Disposition: A | Discharge status: Home / Self Care | Payer: 59 | Source: Ambulatory Visit | Attending: Radiation Oncology | Admitting: Radiation Oncology

## 2016-01-22 ENCOUNTER — Ambulatory Visit: Payer: Self-pay | Admitting: Nurse Practitioner

## 2016-01-22 ENCOUNTER — Other Ambulatory Visit: Payer: Self-pay

## 2016-01-22 DIAGNOSIS — Z510 Encounter for antineoplastic radiation therapy: Secondary | ICD-10-CM | POA: Diagnosis not present

## 2016-01-23 ENCOUNTER — Telehealth: Payer: Self-pay | Admitting: Hematology and Oncology

## 2016-01-23 ENCOUNTER — Ambulatory Visit
Admission: RE | Admit: 2016-01-23 | Discharge: 2016-01-23 | Disposition: A | Discharge status: Home / Self Care | Payer: 59 | Source: Ambulatory Visit | Attending: Radiation Oncology | Admitting: Radiation Oncology

## 2016-01-23 ENCOUNTER — Ambulatory Visit (HOSPITAL_BASED_OUTPATIENT_CLINIC_OR_DEPARTMENT_OTHER): Payer: 59 | Admitting: Hematology and Oncology

## 2016-01-23 ENCOUNTER — Encounter: Payer: Self-pay | Admitting: Nurse Practitioner

## 2016-01-23 ENCOUNTER — Ambulatory Visit (HOSPITAL_BASED_OUTPATIENT_CLINIC_OR_DEPARTMENT_OTHER): Payer: 59 | Admitting: Nurse Practitioner

## 2016-01-23 VITALS — BP 97/60 | HR 71 | Temp 98.7°F | Resp 18 | Ht 64.0 in | Wt 198.0 lb

## 2016-01-23 DIAGNOSIS — R110 Nausea: Secondary | ICD-10-CM

## 2016-01-23 DIAGNOSIS — K1233 Oral mucositis (ulcerative) due to radiation: Secondary | ICD-10-CM | POA: Diagnosis not present

## 2016-01-23 DIAGNOSIS — E860 Dehydration: Secondary | ICD-10-CM

## 2016-01-23 DIAGNOSIS — Z510 Encounter for antineoplastic radiation therapy: Secondary | ICD-10-CM | POA: Diagnosis not present

## 2016-01-23 DIAGNOSIS — T451X5A Adverse effect of antineoplastic and immunosuppressive drugs, initial encounter: Principal | ICD-10-CM

## 2016-01-23 DIAGNOSIS — C8331 Diffuse large B-cell lymphoma, lymph nodes of head, face, and neck: Secondary | ICD-10-CM

## 2016-01-23 MED ORDER — MORPHINE SULFATE (CONCENTRATE) 10 MG/0.5ML PO SOLN: 10.0000 mg | ORAL | 0 refills | Status: DC | PRN

## 2016-01-23 MED ORDER — HEPARIN SOD (PORK) LOCK FLUSH 100 UNIT/ML IV SOLN
500.0000 [IU] | Freq: Once | INTRAVENOUS | Status: AC | PRN
  Administered 2016-01-23: 500 [IU]
  Filled 2016-01-23: qty 5

## 2016-01-23 MED ORDER — FENTANYL 25 MCG/HR TD PT72
25.0000 ug | MEDICATED_PATCH | TRANSDERMAL | 0 refills | Status: DC
Start: 2016-01-23 — End: 2016-08-13

## 2016-01-23 MED ORDER — SODIUM CHLORIDE 0.9 % IJ SOLN
10.0000 mL | INTRAMUSCULAR | Status: DC | PRN
  Administered 2016-01-23: 10 mL
  Filled 2016-01-23: qty 10

## 2016-01-23 MED ORDER — SODIUM CHLORIDE 0.9 % IV SOLN
1000.0000 mL | Freq: Once | INTRAVENOUS | Status: AC
  Administered 2016-01-23: 1000 mL via INTRAVENOUS

## 2016-01-23 MED FILL — ONDANSETRON HCL 8 MG TABLET: 8 | 3 days supply | Qty: 9 | Fill #2

## 2016-01-23 MED FILL — LIDOCAINE 2% VISCOUS SOLN: 2 | 4 days supply | Qty: 100 | Fill #2

## 2016-01-23 NOTE — Progress Notes (Signed)
RN visit only. 

## 2016-01-23 NOTE — Telephone Encounter (Signed)
Appointments scheduled per 12/29 LOS. Patient given AVS report and calendars with future scheduled appointments. °

## 2016-01-23 NOTE — Progress Notes (Signed)
[facility] OFFICE PROGRESS NOTE  Patient Care Team: Haywood Pao, MD as PCP - General (Internal Medicine) Heath Lark, MD as Consulting Physician (Hematology and Oncology) Jodi Marble, MD as Consulting Physician (Otolaryngology) Eppie Gibson, MD as Attending Physician (Radiation Oncology) Leota Sauers, RN as Oncology Nurse Navigator (Oncology) Karie Mainland, RD as Dietitian (Nutrition)  SUMMARY OF ONCOLOGIC HISTORY:   Diffuse large B-cell lymphoma of lymph nodes of neck ([place])   09/24/2015 Pathology Results    Final Cytologic Interpretation [id number]: Right level II neck mass, Fine Needle Aspiration I (smears and Thinprep): Malignant cells present, most consistent with large cell lymphoma.       09/24/2015 Pathology Results    [phone] The nasopharyngeal biopsy shows lymphoid tissue with diffuse effacement of the normal nodal architecture by large cells with vesicular nuclei, prominent nucleoli, and scant cytoplasm. The large cells are positive for CD20, BCL-6, BCL-2, CD5, and MUM1 by immunohistochemistry. They are negative for CD30, CD3, CD10, cyclinD1 and SOX11. In situ hybridization for EBV mRNA is negative. Ki-67 reveals a proliferation rate of approximately 60%. By flow cytometry, the atypical cells express CD19, CD5, and CD23 (see below). The expression of CD5 and CD23 by flow cytometry is suggestive of small lymphocytic lymphoma/ chronic lymphocytic leukemia (SLL/CLL) transforming to a diffuse large B-cell lymphoma (DLBCL); however, there was no evidence of SLL/CLL in the flow sample. Correlation with peripheral blood/ marrow findings would be useful to determine if this lymphoma arose from background SLL/CLL or is a de novo CD5+ DLBCL. De novo CD5+ DLBCL is frequently associated with an aggressive clinical course and poor response to chemotherapy. A pleomorphic mantle cell lymphoma is less likely since the cells are negative for Cyclin D1 and SOX11. FISH studies  are pending and will be reported as an addendum.       09/30/2015 Imaging    CT scan of neck showed bulky RIGHT neck lymphadenopathy concerning for metastatic disease. 8 mm submucosal RIGHT base of tongue mass, considering ipsilateral lymphadenopathy, this is suspicious for primary head and neck cancer. Isodense LEFT palatine tonsillar mass versus focal lymphoid hyperplasia resulting in LEFT postobstructive middle ear/mastoid effusion.       10/03/2015 Imaging    ECHO showed normal EF      10/08/2015 PET scan    Hypermetabolic left nasopharyngeal and tongue base masses with hypermetabolic bilateral neck adenopathy. Right level 2 adenopathy extends inferiorly into the low right internal jugular station. No additional evidence of metastatic disease in the chest, abdomen or pelvis.      10/09/2015 Procedure    Successful placement of a left internal jugular approach power injectable Port-A-Cath. The catheter is ready for immediate use.      10/13/2015 - 11/24/2015 Chemotherapy    She received 3 cycles of chemotherapy with R-CHOP      12/15/2015 PET scan    Interval complete resolution of hypermetabolic activity associated with the left tonsillar mass and multiple cervical lymph nodes consistent with complete remission. No residual hypermetabolic activity demonstrated.       INTERVAL HISTORY: Please see below for problem oriented charting. She is seen in the infusion room for supportive care. She has developed significant mucositis limiting ability to swallow. She felt dehydrated and is receiving IV fluid support. She denies significant nausea. She is mildly constipated recently.  REVIEW OF SYSTEMS:   Constitutional: Denies fevers, chills or abnormal weight loss Eyes: Denies blurriness of vision Respiratory: Denies cough, dyspnea or wheezes Cardiovascular: Denies palpitation,  chest discomfort or lower extremity swelling Skin: Denies abnormal skin rashes Lymphatics: Denies new  lymphadenopathy or easy bruising Neurological:Denies numbness, tingling or new weaknesses Behavioral/Psych: Mood is stable, no new changes  All other systems were reviewed with the patient and are negative.  I have reviewed the past medical history, past surgical history, social history and family history with the patient and they are unchanged from previous note.  ALLERGIES:  is allergic to latex; penicillins; and sulfa antibiotics.  MEDICATIONS:  Current Outpatient Prescriptions  Medication Sig Dispense Refill  . acetaminophen (TYLENOL) 325 MG tablet Take 650 mg by mouth every 6 (six) hours as needed.    . Ascorbic Acid (VITAMIN C PO) Take 1 tablet by mouth daily.    . Cholecalciferol (VITAMIN D PO) Take 1 tablet by mouth daily.    . fentaNYL (DURAGESIC - DOSED MCG/HR) 25 MCG/HR patch Place 1 patch (25 mcg total) onto the skin every 3 (three) days. 5 patch 0  . HYDROcodone-acetaminophen (HYCET) 7.5-325 mg/15 ml solution Take 10-69m q 4hrs prn pain. 473 mL 0  . lidocaine (XYLOCAINE) 2 % solution Mix 1 part 2%viscous lidocaine,1part H2O.Swish and swallow 170mof this mixture,3043mbefore meals and at bedtime, up to QID 100 mL 5  . lidocaine-prilocaine (EMLA) cream Apply to affected area once (Patient not taking: Reported on 01/20/2016) 30 g 3  . Morphine Sulfate (MORPHINE CONCENTRATE) 10 MG/0.5ML SOLN concentrated solution Take 0.5 mLs (10 mg total) by mouth every 2 (two) hours as needed. 120 mL 0  . Multiple Vitamins-Minerals (MULTIVITAMIN WITH MINERALS) tablet Take 1 tablet by mouth daily.    . naproxen sodium (ANAPROX) 220 MG tablet Take 220 mg by mouth 2 (two) times daily as needed (for pain).    . ondansetron (ZOFRAN) 8 MG tablet Take 1 tablet (8 mg total) by mouth every 8 (eight) hours as needed for refractory nausea / vomiting. 30 tablet 1  . prochlorperazine (COMPAZINE) 10 MG tablet Take 1 tablet (10 mg total) by mouth every 6 (six) hours as needed (Nausea or vomiting). (Patient not  taking: Reported on 01/20/2016) 30 tablet 6  . sodium fluoride (FLUORISHIELD) 1.1 % GEL dental gel Instill one drop of gel per tooth space of fluoride tray. Place over teeth for 5 minutes. Remove. Spit out excess. Repeat nightly. 120 mL prn  . sucralfate (CARAFATE) 1 g tablet Dissolve 1 tablet in 27m60mO and swallow up to QID,PRN sore throat. 60 tablet 5   No current facility-administered medications for this visit.    Facility-Administered Medications Ordered in Other Visits  Medication Dose Route Frequency Provider Last Rate Last Dose  . 0.9 %  sodium chloride infusion  1,000 mL Intravenous Once Shemicka Cohrs GHeath Lark      . heparin lock flush 100 unit/mL  500 Units Intracatheter Once PRN Katelind Pytel GHeath Lark      . sodium chloride 0.9 % injection 10 mL  10 mL Intracatheter PRN Yostin Malacara GHeath Lark        PHYSICAL EXAMINATION: ECOG PERFORMANCE STATUS: 1 - Symptomatic but completely ambulatory  Vitals:   01/23/16 0857  BP: 104/71  Pulse: 86  Resp: 18  Temp: 98.5 F (36.9 C)   Filed Weights   01/23/16 0857  Weight: 198 lb 14.4 oz (90.2 kg)    GENERAL:alert, no distress and comfortable SKIN: skin color, texture, turgor are normal, no rashes or significant lesions EYES: normal, Conjunctiva are pink and non-injected, sclera clear OROPHARYNX:no exudate, no erythema and lips, buccal mucosa, and  tongue normal  NECK: supple, thyroid normal size, non-tender, without nodularity LYMPH:  no palpable lymphadenopathy in the cervical, axillary or inguinal LUNGS: clear to auscultation and percussion with normal breathing effort HEART: regular rate & rhythm and no murmurs and no lower extremity edema ABDOMEN:abdomen soft, non-tender and normal bowel sounds Musculoskeletal:no cyanosis of digits and no clubbing  NEURO: alert & oriented x 3 with fluent speech, no focal motor/sensory deficits  LABORATORY DATA:  I have reviewed the data as listed    Component Value Date/Time   NA 137 01/21/2016 0857   K 4.1  01/21/2016 0857   CL 103 10/09/2015 0900   CO2 28 01/21/2016 0857   GLUCOSE 112 01/21/2016 0857   BUN 24.0 01/21/2016 0857   CREATININE 0.9 01/21/2016 0857   CALCIUM 9.5 01/21/2016 0857   PROT 7.0 01/08/2016 0905   ALBUMIN 3.1 (L) 01/08/2016 0905   AST 19 01/08/2016 0905   ALT 22 01/08/2016 0905   ALKPHOS 70 01/08/2016 0905   BILITOT 0.47 01/08/2016 0905   GFRNONAA >60 10/09/2015 0900   GFRAA >60 10/09/2015 0900    No results found for: SPEP, UPEP  Lab Results  Component Value Date   WBC 4.8 01/08/2016   NEUTROABS 3.2 01/08/2016   HGB 10.7 (L) 01/08/2016   HCT 32.7 (L) 01/08/2016   MCV 87.5 01/08/2016   PLT 226 01/08/2016      Chemistry      Component Value Date/Time   NA 137 01/21/2016 0857   K 4.1 01/21/2016 0857   CL 103 10/09/2015 0900   CO2 28 01/21/2016 0857   BUN 24.0 01/21/2016 0857   CREATININE 0.9 01/21/2016 0857      Component Value Date/Time   CALCIUM 9.5 01/21/2016 0857   ALKPHOS 70 01/08/2016 0905   AST 19 01/08/2016 0905   ALT 22 01/08/2016 0905   BILITOT 0.47 01/08/2016 0905      ASSESSMENT & PLAN:  Diffuse large B-cell lymphoma of lymph nodes of neck ([place]) She is experiencing side effects from radiation treatment. Continue supportive care  Mucositis due to radiation therapy She has dehydration related to mucositis pain. We discussed extensively aggressive pain management. She will continue conservative approach with lidocaine and sucralfate. In addition, I also recommend addition of fentanyl patch and liquid morphine concentrate for breakthrough pain. We discussed risk of nausea, sedation and constipation. I will see her back in 2 weeks for further review of pain management. I agree with IV fluid support for now   No orders of the defined types were placed in this encounter.  All questions were answered. The patient knows to call the clinic with any problems, questions or concerns. No barriers to learning was detected. I spent 15  minutes counseling the patient face to face. The total time spent in the appointment was 20 minutes and more than 50% was on counseling and review of test results     Heath Lark, MD 01/23/2016 10:37 AM

## 2016-01-23 NOTE — Assessment & Plan Note (Signed)
She has dehydration related to mucositis pain. We discussed extensively aggressive pain management. She will continue conservative approach with lidocaine and sucralfate. In addition, I also recommend addition of fentanyl patch and liquid morphine concentrate for breakthrough pain. We discussed risk of nausea, sedation and constipation. I will see her back in 2 weeks for further review of pain management. I agree with IV fluid support for now

## 2016-01-23 NOTE — Assessment & Plan Note (Signed)
She is experiencing side effects from radiation treatment. Continue supportive care

## 2016-01-23 NOTE — Progress Notes (Signed)
Made Dr. Alvy Bimler aware of pt's BP after IV fluids.  Pt to increase po fluids at home to include gatorade type fluids with electrolytes. Pt to call if not feeling well over the weekend. Returning to [facility] for fluids on Tuesday, 01/27/16

## 2016-01-23 NOTE — Patient Instructions (Signed)
Dehydration, Adult Dehydration is a condition in which there is not enough fluid or water in the body. This happens when you lose more fluids than you take in. Important organs, such as the kidneys, brain, and heart, cannot function without a proper amount of fluids. Any loss of fluids from the body can lead to dehydration. Dehydration can range from mild to severe. This condition should be treated right away to prevent it from becoming severe. What are the causes? This condition may be caused by:  Vomiting.  Diarrhea.  Excessive sweating, such as from heat exposure or exercise.  Not drinking enough fluid, especially:  When ill.  While doing activity that requires a lot of energy.  Excessive urination.  Fever.  Infection.  Certain medicines, such as medicines that cause the body to lose excess fluid (diuretics).  Inability to access safe drinking water.  Reduced physical ability to get adequate water and food. What increases the risk? This condition is more likely to develop in people:  Who have a poorly controlled long-term (chronic) illness, such as diabetes, heart disease, or kidney disease.  Who are age 65 or older.  Who are disabled.  Who live in a place with high altitude.  Who play endurance sports. What are the signs or symptoms? Symptoms of mild dehydration may include:   Thirst.  Dry lips.  Slightly dry mouth.  Dry, warm skin.  Dizziness. Symptoms of moderate dehydration may include:   Very dry mouth.  Muscle cramps.  Dark urine. Urine may be the color of tea.  Decreased urine production.  Decreased tear production.  Heartbeat that is irregular or faster than normal (palpitations).  Headache.  Light-headedness, especially when you stand up from a sitting position.  Fainting (syncope). Symptoms of severe dehydration may include:   Changes in skin, such as:  Cold and clammy skin.  Blotchy (mottled) or pale skin.  Skin that does  not quickly return to normal after being lightly pinched and released (poor skin turgor).  Changes in body fluids, such as:  Extreme thirst.  No tear production.  Inability to sweat when body temperature is high, such as in hot weather.  Very little urine production.  Changes in vital signs, such as:  Weak pulse.  Pulse that is more than 100 beats a minute when sitting still.  Rapid breathing.  Low blood pressure.  Other changes, such as:  Sunken eyes.  Cold hands and feet.  Confusion.  Lack of energy (lethargy).  Difficulty waking up from sleep.  Short-term weight loss.  Unconsciousness. How is this diagnosed? This condition is diagnosed based on your symptoms and a physical exam. Blood and urine tests may be done to help confirm the diagnosis. How is this treated? Treatment for this condition depends on the severity. Mild or moderate dehydration can often be treated at home. Treatment should be started right away. Do not wait until dehydration becomes severe. Severe dehydration is an emergency and it needs to be treated in a hospital. Treatment for mild dehydration may include:   Drinking more fluids.  Replacing salts and minerals in your blood (electrolytes) that you may have lost. Treatment for moderate dehydration may include:   Drinking an oral rehydration solution (ORS). This is a drink that helps you replace fluids and electrolytes (rehydrate). It can be found at pharmacies and retail stores. Treatment for severe dehydration may include:   Receiving fluids through an IV tube.  Receiving an electrolyte solution through a feeding tube that is   passed through your nose and into your stomach (nasogastric tube, or NG tube).  Correcting any abnormalities in electrolytes.  Treating the underlying cause of dehydration. Follow these instructions at home:  If directed by your health care provider, drink an ORS:  Make an ORS by following instructions on the  package.  Start by drinking small amounts, about  cup (120 mL) every 5-10 minutes.  Slowly increase how much you drink until you have taken the amount recommended by your health care provider.  Drink enough clear fluid to keep your urine clear or pale yellow. If you were told to drink an ORS, finish the ORS first, then start slowly drinking other clear fluids. Drink fluids such as:  Water. Do not drink only water. Doing that can lead to having too little salt (sodium) in the body (hyponatremia).  Ice chips.  Fruit juice that you have added water to (diluted fruit juice).  Low-calorie sports drinks.  Avoid:  Alcohol.  Drinks that contain a lot of sugar. These include high-calorie sports drinks, fruit juice that is not diluted, and soda.  Caffeine.  Foods that are greasy or contain a lot of fat or sugar.  Take over-the-counter and prescription medicines only as told by your health care provider.  Do not take sodium tablets. This can lead to having too much sodium in the body (hypernatremia).  Eat foods that contain a healthy balance of electrolytes, such as bananas, oranges, potatoes, tomatoes, and spinach.  Keep all follow-up visits as told by your health care provider. This is important. Contact a health care provider if:  You have abdominal pain that:  Gets worse.  Stays in one area (localizes).  You have a rash.  You have a stiff neck.  You are more irritable than usual.  You are sleepier or more difficult to wake up than usual.  You feel weak or dizzy.  You feel very thirsty.  You have urinated only a small amount of very dark urine over 6-8 hours. Get help right away if:  You have symptoms of severe dehydration.  You cannot drink fluids without vomiting.  Your symptoms get worse with treatment.  You have a fever.  You have a severe headache.  You have vomiting or diarrhea that:  Gets worse.  Does not go away.  You have blood or green matter  (bile) in your vomit.  You have blood in your stool. This may cause stool to look black and tarry.  You have not urinated in 6-8 hours.  You faint.  Your heart rate while sitting still is over 100 beats a minute.  You have trouble breathing. This information is not intended to replace advice given to you by your health care provider. Make sure you discuss any questions you have with your health care provider. Document Released: 01/11/2005 Document Revised: 08/08/2015 Document Reviewed: 03/07/2015 Elsevier Interactive Patient Education  2017 [employer]  

## 2016-01-27 ENCOUNTER — Encounter: Payer: Self-pay | Admitting: Radiation Oncology

## 2016-01-27 ENCOUNTER — Ambulatory Visit
Admission: RE | Admit: 2016-01-27 | Discharge: 2016-01-27 | Disposition: A | Discharge status: Home / Self Care | Payer: 59 | Source: Ambulatory Visit | Attending: Radiation Oncology | Admitting: Radiation Oncology

## 2016-01-27 ENCOUNTER — Ambulatory Visit (HOSPITAL_BASED_OUTPATIENT_CLINIC_OR_DEPARTMENT_OTHER): Payer: 59 | Admitting: Nurse Practitioner

## 2016-01-27 ENCOUNTER — Encounter: Payer: Self-pay | Admitting: *Deleted

## 2016-01-27 ENCOUNTER — Ambulatory Visit: Payer: 59 | Admitting: Nutrition

## 2016-01-27 ENCOUNTER — Ambulatory Visit: Payer: Self-pay | Admitting: Hematology and Oncology

## 2016-01-27 VITALS — BP 115/67 | HR 74 | Temp 98.9°F | Resp 17 | Wt 200.4 lb

## 2016-01-27 VITALS — Temp 98.2°F | Ht 64.0 in | Wt 200.4 lb

## 2016-01-27 DIAGNOSIS — R110 Nausea: Secondary | ICD-10-CM

## 2016-01-27 DIAGNOSIS — E860 Dehydration: Secondary | ICD-10-CM | POA: Diagnosis not present

## 2016-01-27 DIAGNOSIS — T451X5A Adverse effect of antineoplastic and immunosuppressive drugs, initial encounter: Principal | ICD-10-CM

## 2016-01-27 DIAGNOSIS — C8331 Diffuse large B-cell lymphoma, lymph nodes of head, face, and neck: Secondary | ICD-10-CM | POA: Diagnosis not present

## 2016-01-27 DIAGNOSIS — Z510 Encounter for antineoplastic radiation therapy: Secondary | ICD-10-CM | POA: Diagnosis not present

## 2016-01-27 MED ORDER — HEPARIN SOD (PORK) LOCK FLUSH 100 UNIT/ML IV SOLN
500.0000 [IU] | Freq: Once | INTRAVENOUS | Status: AC | PRN
  Administered 2016-01-27: 500 [IU]
  Filled 2016-01-27: qty 5

## 2016-01-27 MED ORDER — SODIUM CHLORIDE 0.9 % IJ SOLN
10.0000 mL | INTRAMUSCULAR | Status: DC | PRN
  Administered 2016-01-27: 10 mL
  Filled 2016-01-27: qty 10

## 2016-01-27 MED ORDER — SODIUM CHLORIDE 0.9 % IV SOLN
Freq: Once | INTRAVENOUS | Status: AC
  Administered 2016-01-27: 10:00:00 via INTRAVENOUS

## 2016-01-27 NOTE — Patient Instructions (Signed)
Dehydration, Adult Dehydration is a condition in which there is not enough fluid or water in the body. This happens when you lose more fluids than you take in. Important organs, such as the kidneys, brain, and heart, cannot function without a proper amount of fluids. Any loss of fluids from the body can lead to dehydration. Dehydration can range from mild to severe. This condition should be treated right away to prevent it from becoming severe. What are the causes? This condition may be caused by:  Vomiting.  Diarrhea.  Excessive sweating, such as from heat exposure or exercise.  Not drinking enough fluid, especially:  When ill.  While doing activity that requires a lot of energy.  Excessive urination.  Fever.  Infection.  Certain medicines, such as medicines that cause the body to lose excess fluid (diuretics).  Inability to access safe drinking water.  Reduced physical ability to get adequate water and food. What increases the risk? This condition is more likely to develop in people:  Who have a poorly controlled long-term (chronic) illness, such as diabetes, heart disease, or kidney disease.  Who are age 65 or older.  Who are disabled.  Who live in a place with high altitude.  Who play endurance sports. What are the signs or symptoms? Symptoms of mild dehydration may include:   Thirst.  Dry lips.  Slightly dry mouth.  Dry, warm skin.  Dizziness. Symptoms of moderate dehydration may include:   Very dry mouth.  Muscle cramps.  Dark urine. Urine may be the color of tea.  Decreased urine production.  Decreased tear production.  Heartbeat that is irregular or faster than normal (palpitations).  Headache.  Light-headedness, especially when you stand up from a sitting position.  Fainting (syncope). Symptoms of severe dehydration may include:   Changes in skin, such as:  Cold and clammy skin.  Blotchy (mottled) or pale skin.  Skin that does  not quickly return to normal after being lightly pinched and released (poor skin turgor).  Changes in body fluids, such as:  Extreme thirst.  No tear production.  Inability to sweat when body temperature is high, such as in hot weather.  Very little urine production.  Changes in vital signs, such as:  Weak pulse.  Pulse that is more than 100 beats a minute when sitting still.  Rapid breathing.  Low blood pressure.  Other changes, such as:  Sunken eyes.  Cold hands and feet.  Confusion.  Lack of energy (lethargy).  Difficulty waking up from sleep.  Short-term weight loss.  Unconsciousness. How is this diagnosed? This condition is diagnosed based on your symptoms and a physical exam. Blood and urine tests may be done to help confirm the diagnosis. How is this treated? Treatment for this condition depends on the severity. Mild or moderate dehydration can often be treated at home. Treatment should be started right away. Do not wait until dehydration becomes severe. Severe dehydration is an emergency and it needs to be treated in a hospital. Treatment for mild dehydration may include:   Drinking more fluids.  Replacing salts and minerals in your blood (electrolytes) that you may have lost. Treatment for moderate dehydration may include:   Drinking an oral rehydration solution (ORS). This is a drink that helps you replace fluids and electrolytes (rehydrate). It can be found at pharmacies and retail stores. Treatment for severe dehydration may include:   Receiving fluids through an IV tube.  Receiving an electrolyte solution through a feeding tube that is   passed through your nose and into your stomach (nasogastric tube, or NG tube).  Correcting any abnormalities in electrolytes.  Treating the underlying cause of dehydration. Follow these instructions at home:  If directed by your health care provider, drink an ORS:  Make an ORS by following instructions on the  package.  Start by drinking small amounts, about  cup (120 mL) every 5-10 minutes.  Slowly increase how much you drink until you have taken the amount recommended by your health care provider.  Drink enough clear fluid to keep your urine clear or pale yellow. If you were told to drink an ORS, finish the ORS first, then start slowly drinking other clear fluids. Drink fluids such as:  Water. Do not drink only water. Doing that can lead to having too little salt (sodium) in the body (hyponatremia).  Ice chips.  Fruit juice that you have added water to (diluted fruit juice).  Low-calorie sports drinks.  Avoid:  Alcohol.  Drinks that contain a lot of sugar. These include high-calorie sports drinks, fruit juice that is not diluted, and soda.  Caffeine.  Foods that are greasy or contain a lot of fat or sugar.  Take over-the-counter and prescription medicines only as told by your health care provider.  Do not take sodium tablets. This can lead to having too much sodium in the body (hypernatremia).  Eat foods that contain a healthy balance of electrolytes, such as bananas, oranges, potatoes, tomatoes, and spinach.  Keep all follow-up visits as told by your health care provider. This is important. Contact a health care provider if:  You have abdominal pain that:  Gets worse.  Stays in one area (localizes).  You have a rash.  You have a stiff neck.  You are more irritable than usual.  You are sleepier or more difficult to wake up than usual.  You feel weak or dizzy.  You feel very thirsty.  You have urinated only a small amount of very dark urine over 6-8 hours. Get help right away if:  You have symptoms of severe dehydration.  You cannot drink fluids without vomiting.  Your symptoms get worse with treatment.  You have a fever.  You have a severe headache.  You have vomiting or diarrhea that:  Gets worse.  Does not go away.  You have blood or green matter  (bile) in your vomit.  You have blood in your stool. This may cause stool to look black and tarry.  You have not urinated in 6-8 hours.  You faint.  Your heart rate while sitting still is over 100 beats a minute.  You have trouble breathing. This information is not intended to replace advice given to you by your health care provider. Make sure you discuss any questions you have with your health care provider. Document Released: 01/11/2005 Document Revised: 08/08/2015 Document Reviewed: 03/07/2015 Elsevier Interactive Patient Education  2017 [employer]  

## 2016-01-27 NOTE — Progress Notes (Signed)
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  Patient reports she continues to have taste alterations. She is drinking four Ensure Plus daily. Her weight is stable and documented as 200.4 pounds today. She reports constipation but will begin taking Metamucil.  per M.D.  Nutrition diagnosis: Food and nutrition related knowledge deficit resolved.  Intervention: Educated patient to continue same strategies for increasing calories, protein and fluid intake to result in weight maintenance and healing. Provided second complementary case of Ensure Plus. Questions were answered.  Teach back method used.  Monitoring, evaluation, goals: Patient will tolerate adequate calories and protein for weight maintenance.  Nutrition diagnosis resolved.  No follow-up scheduled.  **Disclaimer: This note was dictated with voice recognition software. Similar sounding words can inadvertently be transcribed and this note may contain transcription errors which may not have been corrected upon publication of note.**

## 2016-01-27 NOTE — Progress Notes (Signed)
Oncology Nurse Navigator Documentation  Met with Charlotte Davidson during final RT to offer support and to celebrate end of radiation treatment.  She was accompanied by her husband and sister. I provided husband with a Certificate of Recognition for his supportive care. I provided post-RT guidance:  Importance of keeping follow-up appts with Nutrition, Dr. Isidore Moos.  Importance of protecting treatment area from sun.  Continuation of Sonafine application 2-3 times daily. I explained that my role as navigator will continue for several more months and that I will be calling and/or joining her during follow-up visits.   I encouraged her to call me with needs/concerns.   Patient and husband verbalized understanding of information provided.  Gayleen Orem, RN, BSN, [facility] [place] [phone]

## 2016-01-27 NOTE — Progress Notes (Signed)
Charlotte Davidson is here for her last fraction of radiation to her Oropharynx. She denies pain except mild pain when swallowing. She is eating liquid or soft foods including soups and oatmeal. She is drinking about 4 ensure/ boost daily. She reports to be drinking about 16 ounces of water daily. She is scheduled for IV Fluids in medical oncology this Tuesday and Friday and next Tuesday and Friday ordered by Dr. Isidore Moos. Her anterior neck is red with peeling noted. She is using sonafine twice daily and will use neosporin as needed to help with healing. She was given a 2 week follow up appointment today.  Temp 98.2 F (36.8 C)   Ht 5\' 4"  (1.626 m)   Wt 200 lb 6.4 oz (90.9 kg)   LMP 10/09/2011 (Approximate)   SpO2 100% Comment: room air  BMI 34.40 kg/m    Orthostatics: BP sitting 113/76, pulse 89, BP standing 112/76, pulse 96  Wt Readings from Last 3 Encounters:  01/27/16 200 lb 6.4 oz (90.9 kg)  01/23/16 198 lb (89.8 kg)  01/23/16 198 lb 14.4 oz (90.2 kg)

## 2016-01-27 NOTE — Progress Notes (Signed)
  Radiation Oncology         [phone] ________________________________  Name: Charlotte Davidson MRN: [id number]  Date: 01/27/2016  DOB: August 20, 1960  Weekly Radiation Therapy Management    ICD-9-CM ICD-10-CM   1. Diffuse large B-cell lymphoma of lymph nodes of neck (HCC) 202.81 C83.31      Current Dose: 30.6 Gy     Planned Dose:  30.6 Gy  Narrative . . . . . . . . The patient presents for routine under treatment assessment.                                    Ms. Radich is here for her last fraction of radiation to her Oropharynx. She report mild pain when swallowing. She is using Xylocaine. She is eating liquid or soft foods including soups and oatmeal. She is drinking about 4 ensure/ boost daily. She reports to be drinking about 16 ounces of water daily. She is scheduled for IV Fluids in medical oncology this Tuesday and Friday and next Tuesday and Friday ordered by Dr. Isidore Moos. She was given a 2 week follow up appointment today. Her anterior neck is red with peeling noted. She is using sonafine twice daily and will use neosporin as needed to help with healing. The patient is fatigued.                                  Set-up films were reviewed.                                 The chart was checked. Physical Findings. . .  height is 5\' 4"  (1.626 m) and weight is 200 lb 6.4 oz (90.9 kg). Her temperature is 98.2 F (36.8 C). Her oxygen saturation is 100%. . Weight loss of 6lbs since beginning treatment. Lungs are clear to auscultation bilaterally. Heart has regular rate and rhythm. Oropharynx shows no sign of infection, thickened saliva. Some hyperpigmentation changes and dry desquamation of the neck, no moist desquamation. Impression . . . . . . . The patient has tolerated radiation. Plan . . . . . . . . . . . . The patient has completed radiation and will follow up on 02/13/15.  ________________________________   Blair Promise, PhD, MD  This document serves as a record of services  personally performed by Gery Pray, MD. It was created on his behalf by Darcus Austin, a trained medical scribe. The creation of this record is based on the scribe's personal observations and the provider's statements to them. This document has been checked and approved by the attending provider.

## 2016-01-28 ENCOUNTER — Encounter: Payer: Self-pay | Admitting: Nurse Practitioner

## 2016-01-28 NOTE — Progress Notes (Signed)
RN visit only. 

## 2016-01-30 ENCOUNTER — Encounter: Payer: Self-pay | Admitting: Nurse Practitioner

## 2016-01-30 ENCOUNTER — Ambulatory Visit (HOSPITAL_BASED_OUTPATIENT_CLINIC_OR_DEPARTMENT_OTHER): Payer: 59 | Admitting: Nurse Practitioner

## 2016-01-30 VITALS — BP 105/67 | HR 82 | Temp 98.2°F | Resp 16 | Ht 64.0 in | Wt 199.0 lb

## 2016-01-30 DIAGNOSIS — R110 Nausea: Secondary | ICD-10-CM

## 2016-01-30 DIAGNOSIS — T451X5A Adverse effect of antineoplastic and immunosuppressive drugs, initial encounter: Principal | ICD-10-CM

## 2016-01-30 DIAGNOSIS — C8331 Diffuse large B-cell lymphoma, lymph nodes of head, face, and neck: Secondary | ICD-10-CM | POA: Diagnosis not present

## 2016-01-30 DIAGNOSIS — E860 Dehydration: Secondary | ICD-10-CM | POA: Diagnosis not present

## 2016-01-30 MED ORDER — HEPARIN SOD (PORK) LOCK FLUSH 100 UNIT/ML IV SOLN
500.0000 [IU] | Freq: Once | INTRAVENOUS | Status: AC | PRN
  Administered 2016-01-30: 500 [IU]
  Filled 2016-01-30: qty 5

## 2016-01-30 MED ORDER — SODIUM CHLORIDE 0.9 % IJ SOLN
10.0000 mL | INTRAMUSCULAR | Status: DC | PRN
Start: 2016-01-30 — End: 2016-01-30
  Administered 2016-01-30: 10 mL
  Filled 2016-01-30: qty 10

## 2016-01-30 MED ORDER — SODIUM CHLORIDE 0.9 % IV SOLN
Freq: Once | INTRAVENOUS | Status: AC
  Administered 2016-01-30: 09:00:00 via INTRAVENOUS

## 2016-01-30 NOTE — Progress Notes (Signed)
RN visit only. 

## 2016-01-30 NOTE — Patient Instructions (Signed)
Dehydration, Adult Dehydration is a condition in which there is not enough fluid or water in the body. This happens when you lose more fluids than you take in. Important organs, such as the kidneys, brain, and heart, cannot function without a proper amount of fluids. Any loss of fluids from the body can lead to dehydration. Dehydration can range from mild to severe. This condition should be treated right away to prevent it from becoming severe. What are the causes? This condition may be caused by:  Vomiting.  Diarrhea.  Excessive sweating, such as from heat exposure or exercise.  Not drinking enough fluid, especially:  When ill.  While doing activity that requires a lot of energy.  Excessive urination.  Fever.  Infection.  Certain medicines, such as medicines that cause the body to lose excess fluid (diuretics).  Inability to access safe drinking water.  Reduced physical ability to get adequate water and food. What increases the risk? This condition is more likely to develop in people:  Who have a poorly controlled long-term (chronic) illness, such as diabetes, heart disease, or kidney disease.  Who are age 65 or older.  Who are disabled.  Who live in a place with high altitude.  Who play endurance sports. What are the signs or symptoms? Symptoms of mild dehydration may include:   Thirst.  Dry lips.  Slightly dry mouth.  Dry, warm skin.  Dizziness. Symptoms of moderate dehydration may include:   Very dry mouth.  Muscle cramps.  Dark urine. Urine may be the color of tea.  Decreased urine production.  Decreased tear production.  Heartbeat that is irregular or faster than normal (palpitations).  Headache.  Light-headedness, especially when you stand up from a sitting position.  Fainting (syncope). Symptoms of severe dehydration may include:   Changes in skin, such as:  Cold and clammy skin.  Blotchy (mottled) or pale skin.  Skin that does  not quickly return to normal after being lightly pinched and released (poor skin turgor).  Changes in body fluids, such as:  Extreme thirst.  No tear production.  Inability to sweat when body temperature is high, such as in hot weather.  Very little urine production.  Changes in vital signs, such as:  Weak pulse.  Pulse that is more than 100 beats a minute when sitting still.  Rapid breathing.  Low blood pressure.  Other changes, such as:  Sunken eyes.  Cold hands and feet.  Confusion.  Lack of energy (lethargy).  Difficulty waking up from sleep.  Short-term weight loss.  Unconsciousness. How is this diagnosed? This condition is diagnosed based on your symptoms and a physical exam. Blood and urine tests may be done to help confirm the diagnosis. How is this treated? Treatment for this condition depends on the severity. Mild or moderate dehydration can often be treated at home. Treatment should be started right away. Do not wait until dehydration becomes severe. Severe dehydration is an emergency and it needs to be treated in a hospital. Treatment for mild dehydration may include:   Drinking more fluids.  Replacing salts and minerals in your blood (electrolytes) that you may have lost. Treatment for moderate dehydration may include:   Drinking an oral rehydration solution (ORS). This is a drink that helps you replace fluids and electrolytes (rehydrate). It can be found at pharmacies and retail stores. Treatment for severe dehydration may include:   Receiving fluids through an IV tube.  Receiving an electrolyte solution through a feeding tube that is   passed through your nose and into your stomach (nasogastric tube, or NG tube).  Correcting any abnormalities in electrolytes.  Treating the underlying cause of dehydration. Follow these instructions at home:  If directed by your health care provider, drink an ORS:  Make an ORS by following instructions on the  package.  Start by drinking small amounts, about  cup (120 mL) every 5-10 minutes.  Slowly increase how much you drink until you have taken the amount recommended by your health care provider.  Drink enough clear fluid to keep your urine clear or pale yellow. If you were told to drink an ORS, finish the ORS first, then start slowly drinking other clear fluids. Drink fluids such as:  Water. Do not drink only water. Doing that can lead to having too little salt (sodium) in the body (hyponatremia).  Ice chips.  Fruit juice that you have added water to (diluted fruit juice).  Low-calorie sports drinks.  Avoid:  Alcohol.  Drinks that contain a lot of sugar. These include high-calorie sports drinks, fruit juice that is not diluted, and soda.  Caffeine.  Foods that are greasy or contain a lot of fat or sugar.  Take over-the-counter and prescription medicines only as told by your health care provider.  Do not take sodium tablets. This can lead to having too much sodium in the body (hypernatremia).  Eat foods that contain a healthy balance of electrolytes, such as bananas, oranges, potatoes, tomatoes, and spinach.  Keep all follow-up visits as told by your health care provider. This is important. Contact a health care provider if:  You have abdominal pain that:  Gets worse.  Stays in one area (localizes).  You have a rash.  You have a stiff neck.  You are more irritable than usual.  You are sleepier or more difficult to wake up than usual.  You feel weak or dizzy.  You feel very thirsty.  You have urinated only a small amount of very dark urine over 6-8 hours. Get help right away if:  You have symptoms of severe dehydration.  You cannot drink fluids without vomiting.  Your symptoms get worse with treatment.  You have a fever.  You have a severe headache.  You have vomiting or diarrhea that:  Gets worse.  Does not go away.  You have blood or green matter  (bile) in your vomit.  You have blood in your stool. This may cause stool to look black and tarry.  You have not urinated in 6-8 hours.  You faint.  Your heart rate while sitting still is over 100 beats a minute.  You have trouble breathing. This information is not intended to replace advice given to you by your health care provider. Make sure you discuss any questions you have with your health care provider. Document Released: 01/11/2005 Document Revised: 08/08/2015 Document Reviewed: 03/07/2015 Elsevier Interactive Patient Education  2017 [employer]  

## 2016-02-03 ENCOUNTER — Ambulatory Visit (HOSPITAL_BASED_OUTPATIENT_CLINIC_OR_DEPARTMENT_OTHER): Payer: 59 | Admitting: Nurse Practitioner

## 2016-02-03 VITALS — BP 108/69 | HR 95 | Temp 98.4°F | Resp 16 | Ht 64.0 in | Wt 198.8 lb

## 2016-02-03 DIAGNOSIS — C8331 Diffuse large B-cell lymphoma, lymph nodes of head, face, and neck: Secondary | ICD-10-CM

## 2016-02-03 DIAGNOSIS — T451X5A Adverse effect of antineoplastic and immunosuppressive drugs, initial encounter: Principal | ICD-10-CM

## 2016-02-03 DIAGNOSIS — R110 Nausea: Secondary | ICD-10-CM

## 2016-02-03 MED ORDER — HEPARIN SOD (PORK) LOCK FLUSH 100 UNIT/ML IV SOLN
500.0000 [IU] | Freq: Once | INTRAVENOUS | Status: AC | PRN
  Administered 2016-02-03: 500 [IU]
  Filled 2016-02-03: qty 5

## 2016-02-03 MED ORDER — SODIUM CHLORIDE 0.9 % IV SOLN
Freq: Once | INTRAVENOUS | Status: AC
  Administered 2016-02-03: 10:00:00 via INTRAVENOUS

## 2016-02-03 MED ORDER — SODIUM CHLORIDE 0.9 % IJ SOLN
10.0000 mL | INTRAMUSCULAR | Status: DC | PRN
Start: 2016-02-03 — End: 2016-02-04
  Administered 2016-02-03: 10 mL
  Filled 2016-02-03: qty 10

## 2016-02-03 NOTE — Patient Instructions (Signed)
Dehydration, Adult Dehydration is a condition in which there is not enough fluid or water in the body. This happens when you lose more fluids than you take in. Important organs, such as the kidneys, brain, and heart, cannot function without a proper amount of fluids. Any loss of fluids from the body can lead to dehydration. Dehydration can range from mild to severe. This condition should be treated right away to prevent it from becoming severe. What are the causes? This condition may be caused by:  Vomiting.  Diarrhea.  Excessive sweating, such as from heat exposure or exercise.  Not drinking enough fluid, especially:  When ill.  While doing activity that requires a lot of energy.  Excessive urination.  Fever.  Infection.  Certain medicines, such as medicines that cause the body to lose excess fluid (diuretics).  Inability to access safe drinking water.  Reduced physical ability to get adequate water and food. What increases the risk? This condition is more likely to develop in people:  Who have a poorly controlled long-term (chronic) illness, such as diabetes, heart disease, or kidney disease.  Who are age 65 or older.  Who are disabled.  Who live in a place with high altitude.  Who play endurance sports. What are the signs or symptoms? Symptoms of mild dehydration may include:   Thirst.  Dry lips.  Slightly dry mouth.  Dry, warm skin.  Dizziness. Symptoms of moderate dehydration may include:   Very dry mouth.  Muscle cramps.  Dark urine. Urine may be the color of tea.  Decreased urine production.  Decreased tear production.  Heartbeat that is irregular or faster than normal (palpitations).  Headache.  Light-headedness, especially when you stand up from a sitting position.  Fainting (syncope). Symptoms of severe dehydration may include:   Changes in skin, such as:  Cold and clammy skin.  Blotchy (mottled) or pale skin.  Skin that does  not quickly return to normal after being lightly pinched and released (poor skin turgor).  Changes in body fluids, such as:  Extreme thirst.  No tear production.  Inability to sweat when body temperature is high, such as in hot weather.  Very little urine production.  Changes in vital signs, such as:  Weak pulse.  Pulse that is more than 100 beats a minute when sitting still.  Rapid breathing.  Low blood pressure.  Other changes, such as:  Sunken eyes.  Cold hands and feet.  Confusion.  Lack of energy (lethargy).  Difficulty waking up from sleep.  Short-term weight loss.  Unconsciousness. How is this diagnosed? This condition is diagnosed based on your symptoms and a physical exam. Blood and urine tests may be done to help confirm the diagnosis. How is this treated? Treatment for this condition depends on the severity. Mild or moderate dehydration can often be treated at home. Treatment should be started right away. Do not wait until dehydration becomes severe. Severe dehydration is an emergency and it needs to be treated in a hospital. Treatment for mild dehydration may include:   Drinking more fluids.  Replacing salts and minerals in your blood (electrolytes) that you may have lost. Treatment for moderate dehydration may include:   Drinking an oral rehydration solution (ORS). This is a drink that helps you replace fluids and electrolytes (rehydrate). It can be found at pharmacies and retail stores. Treatment for severe dehydration may include:   Receiving fluids through an IV tube.  Receiving an electrolyte solution through a feeding tube that is   passed through your nose and into your stomach (nasogastric tube, or NG tube).  Correcting any abnormalities in electrolytes.  Treating the underlying cause of dehydration. Follow these instructions at home:  If directed by your health care provider, drink an ORS:  Make an ORS by following instructions on the  package.  Start by drinking small amounts, about  cup (120 mL) every 5-10 minutes.  Slowly increase how much you drink until you have taken the amount recommended by your health care provider.  Drink enough clear fluid to keep your urine clear or pale yellow. If you were told to drink an ORS, finish the ORS first, then start slowly drinking other clear fluids. Drink fluids such as:  Water. Do not drink only water. Doing that can lead to having too little salt (sodium) in the body (hyponatremia).  Ice chips.  Fruit juice that you have added water to (diluted fruit juice).  Low-calorie sports drinks.  Avoid:  Alcohol.  Drinks that contain a lot of sugar. These include high-calorie sports drinks, fruit juice that is not diluted, and soda.  Caffeine.  Foods that are greasy or contain a lot of fat or sugar.  Take over-the-counter and prescription medicines only as told by your health care provider.  Do not take sodium tablets. This can lead to having too much sodium in the body (hypernatremia).  Eat foods that contain a healthy balance of electrolytes, such as bananas, oranges, potatoes, tomatoes, and spinach.  Keep all follow-up visits as told by your health care provider. This is important. Contact a health care provider if:  You have abdominal pain that:  Gets worse.  Stays in one area (localizes).  You have a rash.  You have a stiff neck.  You are more irritable than usual.  You are sleepier or more difficult to wake up than usual.  You feel weak or dizzy.  You feel very thirsty.  You have urinated only a small amount of very dark urine over 6-8 hours. Get help right away if:  You have symptoms of severe dehydration.  You cannot drink fluids without vomiting.  Your symptoms get worse with treatment.  You have a fever.  You have a severe headache.  You have vomiting or diarrhea that:  Gets worse.  Does not go away.  You have blood or green matter  (bile) in your vomit.  You have blood in your stool. This may cause stool to look black and tarry.  You have not urinated in 6-8 hours.  You faint.  Your heart rate while sitting still is over 100 beats a minute.  You have trouble breathing. This information is not intended to replace advice given to you by your health care provider. Make sure you discuss any questions you have with your health care provider. Document Released: 01/11/2005 Document Revised: 08/08/2015 Document Reviewed: 03/07/2015 Elsevier Interactive Patient Education  2017 [employer]  

## 2016-02-04 ENCOUNTER — Encounter: Payer: Self-pay | Admitting: Nurse Practitioner

## 2016-02-04 NOTE — Progress Notes (Signed)
RN visit only. 

## 2016-02-06 ENCOUNTER — Encounter: Payer: Self-pay | Admitting: Nurse Practitioner

## 2016-02-06 ENCOUNTER — Ambulatory Visit (HOSPITAL_BASED_OUTPATIENT_CLINIC_OR_DEPARTMENT_OTHER): Payer: 59 | Admitting: Nurse Practitioner

## 2016-02-06 VITALS — BP 103/67 | HR 69 | Temp 98.4°F | Resp 17 | Ht 64.0 in | Wt 197.4 lb

## 2016-02-06 DIAGNOSIS — R110 Nausea: Secondary | ICD-10-CM

## 2016-02-06 DIAGNOSIS — T451X5A Adverse effect of antineoplastic and immunosuppressive drugs, initial encounter: Principal | ICD-10-CM

## 2016-02-06 DIAGNOSIS — C8331 Diffuse large B-cell lymphoma, lymph nodes of head, face, and neck: Secondary | ICD-10-CM

## 2016-02-06 DIAGNOSIS — E860 Dehydration: Secondary | ICD-10-CM | POA: Diagnosis not present

## 2016-02-06 MED ORDER — HEPARIN SOD (PORK) LOCK FLUSH 100 UNIT/ML IV SOLN
500.0000 [IU] | Freq: Once | INTRAVENOUS | Status: AC | PRN
  Administered 2016-02-06: 500 [IU]
  Filled 2016-02-06: qty 5

## 2016-02-06 MED ORDER — SODIUM CHLORIDE 0.9 % IV SOLN
Freq: Once | INTRAVENOUS | Status: AC
  Administered 2016-02-06: 10:00:00 via INTRAVENOUS

## 2016-02-06 MED ORDER — SODIUM CHLORIDE 0.9 % IJ SOLN
10.0000 mL | INTRAMUSCULAR | Status: DC | PRN
  Administered 2016-02-06: 10 mL
  Filled 2016-02-06: qty 10

## 2016-02-06 NOTE — Progress Notes (Signed)
RN visit only. 

## 2016-02-06 NOTE — Patient Instructions (Signed)
Dehydration, Adult Dehydration is a condition in which there is not enough fluid or water in the body. This happens when you lose more fluids than you take in. Important organs, such as the kidneys, brain, and heart, cannot function without a proper amount of fluids. Any loss of fluids from the body can lead to dehydration. Dehydration can range from mild to severe. This condition should be treated right away to prevent it from becoming severe. What are the causes? This condition may be caused by:  Vomiting.  Diarrhea.  Excessive sweating, such as from heat exposure or exercise.  Not drinking enough fluid, especially:  When ill.  While doing activity that requires a lot of energy.  Excessive urination.  Fever.  Infection.  Certain medicines, such as medicines that cause the body to lose excess fluid (diuretics).  Inability to access safe drinking water.  Reduced physical ability to get adequate water and food. What increases the risk? This condition is more likely to develop in people:  Who have a poorly controlled long-term (chronic) illness, such as diabetes, heart disease, or kidney disease.  Who are age 65 or older.  Who are disabled.  Who live in a place with high altitude.  Who play endurance sports. What are the signs or symptoms? Symptoms of mild dehydration may include:   Thirst.  Dry lips.  Slightly dry mouth.  Dry, warm skin.  Dizziness. Symptoms of moderate dehydration may include:   Very dry mouth.  Muscle cramps.  Dark urine. Urine may be the color of tea.  Decreased urine production.  Decreased tear production.  Heartbeat that is irregular or faster than normal (palpitations).  Headache.  Light-headedness, especially when you stand up from a sitting position.  Fainting (syncope). Symptoms of severe dehydration may include:   Changes in skin, such as:  Cold and clammy skin.  Blotchy (mottled) or pale skin.  Skin that does  not quickly return to normal after being lightly pinched and released (poor skin turgor).  Changes in body fluids, such as:  Extreme thirst.  No tear production.  Inability to sweat when body temperature is high, such as in hot weather.  Very little urine production.  Changes in vital signs, such as:  Weak pulse.  Pulse that is more than 100 beats a minute when sitting still.  Rapid breathing.  Low blood pressure.  Other changes, such as:  Sunken eyes.  Cold hands and feet.  Confusion.  Lack of energy (lethargy).  Difficulty waking up from sleep.  Short-term weight loss.  Unconsciousness. How is this diagnosed? This condition is diagnosed based on your symptoms and a physical exam. Blood and urine tests may be done to help confirm the diagnosis. How is this treated? Treatment for this condition depends on the severity. Mild or moderate dehydration can often be treated at home. Treatment should be started right away. Do not wait until dehydration becomes severe. Severe dehydration is an emergency and it needs to be treated in a hospital. Treatment for mild dehydration may include:   Drinking more fluids.  Replacing salts and minerals in your blood (electrolytes) that you may have lost. Treatment for moderate dehydration may include:   Drinking an oral rehydration solution (ORS). This is a drink that helps you replace fluids and electrolytes (rehydrate). It can be found at pharmacies and retail stores. Treatment for severe dehydration may include:   Receiving fluids through an IV tube.  Receiving an electrolyte solution through a feeding tube that is   passed through your nose and into your stomach (nasogastric tube, or NG tube).  Correcting any abnormalities in electrolytes.  Treating the underlying cause of dehydration. Follow these instructions at home:  If directed by your health care provider, drink an ORS:  Make an ORS by following instructions on the  package.  Start by drinking small amounts, about  cup (120 mL) every 5-10 minutes.  Slowly increase how much you drink until you have taken the amount recommended by your health care provider.  Drink enough clear fluid to keep your urine clear or pale yellow. If you were told to drink an ORS, finish the ORS first, then start slowly drinking other clear fluids. Drink fluids such as:  Water. Do not drink only water. Doing that can lead to having too little salt (sodium) in the body (hyponatremia).  Ice chips.  Fruit juice that you have added water to (diluted fruit juice).  Low-calorie sports drinks.  Avoid:  Alcohol.  Drinks that contain a lot of sugar. These include high-calorie sports drinks, fruit juice that is not diluted, and soda.  Caffeine.  Foods that are greasy or contain a lot of fat or sugar.  Take over-the-counter and prescription medicines only as told by your health care provider.  Do not take sodium tablets. This can lead to having too much sodium in the body (hypernatremia).  Eat foods that contain a healthy balance of electrolytes, such as bananas, oranges, potatoes, tomatoes, and spinach.  Keep all follow-up visits as told by your health care provider. This is important. Contact a health care provider if:  You have abdominal pain that:  Gets worse.  Stays in one area (localizes).  You have a rash.  You have a stiff neck.  You are more irritable than usual.  You are sleepier or more difficult to wake up than usual.  You feel weak or dizzy.  You feel very thirsty.  You have urinated only a small amount of very dark urine over 6-8 hours. Get help right away if:  You have symptoms of severe dehydration.  You cannot drink fluids without vomiting.  Your symptoms get worse with treatment.  You have a fever.  You have a severe headache.  You have vomiting or diarrhea that:  Gets worse.  Does not go away.  You have blood or green matter  (bile) in your vomit.  You have blood in your stool. This may cause stool to look black and tarry.  You have not urinated in 6-8 hours.  You faint.  Your heart rate while sitting still is over 100 beats a minute.  You have trouble breathing. This information is not intended to replace advice given to you by your health care provider. Make sure you discuss any questions you have with your health care provider. Document Released: 01/11/2005 Document Revised: 08/08/2015 Document Reviewed: 03/07/2015 Elsevier Interactive Patient Education  2017 [employer]  

## 2016-02-10 ENCOUNTER — Encounter: Payer: Self-pay | Admitting: Radiation Oncology

## 2016-02-12 NOTE — Progress Notes (Signed)
Charlotte Davidson presents for follow up of radiation completed 01/27/16 to her Oropharynx.    Pain issues, if any: She denies.  Using a feeding tube?: No Weight changes, if any:  Wt Readings from Last 3 Encounters:  02/13/16 194 lb (88 kg)  02/13/16 196 lb (88.9 kg)  02/06/16 197 lb 6.4 oz (89.5 kg)   Swallowing issues, if any: She reports swallowing well. She is modifying her diet some with softer foods, and moistening her food. She reports continued difficulties with taste.  Smoking or chewing tobacco? No Using fluoride trays daily? Yes.  Last ENT visit was on: Has not seen since completing radiation. Other notable issues, if any: She denies.  BP 112/77   Pulse 64   Temp 97.8 F (36.6 C)   Ht 5\' 4"  (1.626 m)   Wt 194 lb (88 kg)   LMP 10/09/2011 (Approximate)   SpO2 100% Comment: room air  BMI 33.30 kg/m

## 2016-02-13 ENCOUNTER — Ambulatory Visit (HOSPITAL_BASED_OUTPATIENT_CLINIC_OR_DEPARTMENT_OTHER): Payer: 59 | Admitting: Hematology and Oncology

## 2016-02-13 ENCOUNTER — Telehealth: Payer: Self-pay | Admitting: Hematology and Oncology

## 2016-02-13 ENCOUNTER — Other Ambulatory Visit: Payer: Self-pay | Admitting: Hematology and Oncology

## 2016-02-13 ENCOUNTER — Encounter: Payer: Self-pay | Admitting: Radiation Oncology

## 2016-02-13 ENCOUNTER — Ambulatory Visit
Admission: RE | Admit: 2016-02-13 | Discharge: 2016-02-13 | Disposition: A | Discharge status: Home / Self Care | Payer: 59 | Source: Ambulatory Visit | Attending: Radiation Oncology | Admitting: Radiation Oncology

## 2016-02-13 ENCOUNTER — Encounter: Payer: Self-pay | Admitting: Hematology and Oncology

## 2016-02-13 VITALS — BP 114/62 | HR 81 | Temp 98.4°F | Resp 18 | Ht 64.0 in | Wt 196.0 lb

## 2016-02-13 DIAGNOSIS — Z880 Allergy status to penicillin: Secondary | ICD-10-CM | POA: Diagnosis not present

## 2016-02-13 DIAGNOSIS — Z923 Personal history of irradiation: Secondary | ICD-10-CM | POA: Insufficient documentation

## 2016-02-13 DIAGNOSIS — Z79899 Other long term (current) drug therapy: Secondary | ICD-10-CM | POA: Insufficient documentation

## 2016-02-13 DIAGNOSIS — Z882 Allergy status to sulfonamides status: Secondary | ICD-10-CM | POA: Diagnosis not present

## 2016-02-13 DIAGNOSIS — Z08 Encounter for follow-up examination after completed treatment for malignant neoplasm: Secondary | ICD-10-CM | POA: Insufficient documentation

## 2016-02-13 DIAGNOSIS — Z8572 Personal history of non-Hodgkin lymphomas: Secondary | ICD-10-CM | POA: Diagnosis not present

## 2016-02-13 DIAGNOSIS — C8331 Diffuse large B-cell lymphoma, lymph nodes of head, face, and neck: Secondary | ICD-10-CM | POA: Diagnosis not present

## 2016-02-13 DIAGNOSIS — Z91040 Latex allergy status: Secondary | ICD-10-CM | POA: Diagnosis not present

## 2016-02-13 DIAGNOSIS — R432 Parageusia: Secondary | ICD-10-CM | POA: Diagnosis not present

## 2016-02-13 DIAGNOSIS — R7989 Other specified abnormal findings of blood chemistry: Secondary | ICD-10-CM | POA: Insufficient documentation

## 2016-02-13 HISTORY — DX: Personal history of irradiation: Z92.3

## 2016-02-13 NOTE — Progress Notes (Signed)
  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  The patient reported pain when swallowing, for which she was using Xylocaine with relief. She also experienced taste change and loss. She received IV fluids throughout treatment as needed.  Plan: The patient has completed radiation treatment. The patient was prescribed Hycet and Xylocaine over the course of treatment, and instructed to use Senokot as needed to avoid constipation while using these medications. The patient will return to radiation oncology clinic for routine followup in one half month. I advised the patient to call or return sooner if any questions or concerns arise that are related to recovery or treatment.  -----------------------------------  Eppie Gibson, MD  This document serves as a record of services personally performed by Eppie Gibson, MD. It was created on her behalf by Maryla Morrow, a trained medical scribe. The creation of this record is based on the scribe's personal observations and the provider's statements to them. This document has been checked and approved by the attending provider.

## 2016-02-13 NOTE — Progress Notes (Signed)
[place] OFFICE PROGRESS NOTE  Patient Care Team: Haywood Pao, MD as PCP - General (Internal Medicine) Heath Lark, MD as Consulting Physician (Hematology and Oncology) Jodi Marble, MD as Consulting Physician (Otolaryngology) Eppie Gibson, MD as Attending Physician (Radiation Oncology) Leota Sauers, RN as Oncology Nurse Navigator (Oncology) Karie Mainland, RD as Dietitian (Nutrition)  SUMMARY OF ONCOLOGIC HISTORY:   Diffuse large B-cell lymphoma of lymph nodes of neck ([place])   09/24/2015 Pathology Results    Final Cytologic Interpretation [id number]: Right level II neck mass, Fine Needle Aspiration I (smears and Thinprep): Malignant cells present, most consistent with large cell lymphoma.       09/24/2015 Pathology Results    [phone] The nasopharyngeal biopsy shows lymphoid tissue with diffuse effacement of the normal nodal architecture by large cells with vesicular nuclei, prominent nucleoli, and scant cytoplasm. The large cells are positive for CD20, BCL-6, BCL-2, CD5, and MUM1 by immunohistochemistry. They are negative for CD30, CD3, CD10, cyclinD1 and SOX11. In situ hybridization for EBV mRNA is negative. Ki-67 reveals a proliferation rate of approximately 60%. By flow cytometry, the atypical cells express CD19, CD5, and CD23 (see below). The expression of CD5 and CD23 by flow cytometry is suggestive of small lymphocytic lymphoma/ chronic lymphocytic leukemia (SLL/CLL) transforming to a diffuse large B-cell lymphoma (DLBCL); however, there was no evidence of SLL/CLL in the flow sample. Correlation with peripheral blood/ marrow findings would be useful to determine if this lymphoma arose from background SLL/CLL or is a de novo CD5+ DLBCL. De novo CD5+ DLBCL is frequently associated with an aggressive clinical course and poor response to chemotherapy. A pleomorphic mantle cell lymphoma is less likely since the cells are negative for Cyclin D1 and SOX11. FISH studies  are pending and will be reported as an addendum.       09/30/2015 Imaging    CT scan of neck showed bulky RIGHT neck lymphadenopathy concerning for metastatic disease. 8 mm submucosal RIGHT base of tongue mass, considering ipsilateral lymphadenopathy, this is suspicious for primary head and neck cancer. Isodense LEFT palatine tonsillar mass versus focal lymphoid hyperplasia resulting in LEFT postobstructive middle ear/mastoid effusion.       10/03/2015 Imaging    ECHO showed normal EF      10/08/2015 PET scan    Hypermetabolic left nasopharyngeal and tongue base masses with hypermetabolic bilateral neck adenopathy. Right level 2 adenopathy extends inferiorly into the low right internal jugular station. No additional evidence of metastatic disease in the chest, abdomen or pelvis.      10/09/2015 Procedure    Successful placement of a left internal jugular approach power injectable Port-A-Cath. The catheter is ready for immediate use.      10/13/2015 - 11/24/2015 Chemotherapy    She received 3 cycles of chemotherapy with R-CHOP      12/15/2015 PET scan    Interval complete resolution of hypermetabolic activity associated with the left tonsillar mass and multiple cervical lymph nodes consistent with complete remission. No residual hypermetabolic activity demonstrated.      01/01/2016 - 01/27/2016 Radiation Therapy    She completed radiation treatment       INTERVAL HISTORY: Please see below for problem oriented charting. She returns today with her husband and family Most of the side effects from recent treatment had resolved She denies pain, dysphagia, nausea or vomiting She is able to eat and drink normally She has mild altered taste sensation but that does not cause any problem with eating  or recent weight loss Denies recent infection No recent lymphadenopathy  REVIEW OF SYSTEMS:   Constitutional: Denies fevers, chills or abnormal weight loss Eyes: Denies blurriness of  vision Ears, nose, mouth, throat, and face: Denies mucositis or sore throat Respiratory: Denies cough, dyspnea or wheezes Cardiovascular: Denies palpitation, chest discomfort or lower extremity swelling Gastrointestinal:  Denies nausea, heartburn or change in bowel habits Skin: Denies abnormal skin rashes Lymphatics: Denies new lymphadenopathy or easy bruising Neurological:Denies numbness, tingling or new weaknesses Behavioral/Psych: Mood is stable, no new changes  All other systems were reviewed with the patient and are negative.  I have reviewed the past medical history, past surgical history, social history and family history with the patient and they are unchanged from previous note.  ALLERGIES:  is allergic to latex; penicillins; and sulfa antibiotics.  MEDICATIONS:  Current Outpatient Prescriptions  Medication Sig Dispense Refill  . acetaminophen (TYLENOL) 325 MG tablet Take 650 mg by mouth every 6 (six) hours as needed.    . Ascorbic Acid (VITAMIN C PO) Take 1 tablet by mouth daily.    . Cholecalciferol (VITAMIN D PO) Take 1 tablet by mouth daily.    . fentaNYL (DURAGESIC - DOSED MCG/HR) 25 MCG/HR patch Place 1 patch (25 mcg total) onto the skin every 3 (three) days. (Patient not taking: Reported on 01/27/2016) 5 patch 0  . HYDROcodone-acetaminophen (HYCET) 7.5-325 mg/15 ml solution Take 10-35m q 4hrs prn pain. (Patient not taking: Reported on 01/27/2016) 473 mL 0  . lidocaine (XYLOCAINE) 2 % solution Mix 1 part 2%viscous lidocaine,1part H2O.Swish and swallow 173mof this mixture,3056mbefore meals and at bedtime, up to QID (Patient not taking: Reported on 02/13/2016) 100 mL 5  . lidocaine-prilocaine (EMLA) cream Apply to affected area once 30 g 3  . Morphine Sulfate (MORPHINE CONCENTRATE) 10 MG/0.5ML SOLN concentrated solution Take 0.5 mLs (10 mg total) by mouth every 2 (two) hours as needed. (Patient not taking: Reported on 01/27/2016) 120 mL 0  . Multiple Vitamins-Minerals (MULTIVITAMIN  WITH MINERALS) tablet Take 1 tablet by mouth daily.    . naproxen sodium (ANAPROX) 220 MG tablet Take 220 mg by mouth 2 (two) times daily as needed (for pain).    . ondansetron (ZOFRAN) 8 MG tablet Take 1 tablet (8 mg total) by mouth every 8 (eight) hours as needed for refractory nausea / vomiting. (Patient not taking: Reported on 02/13/2016) 30 tablet 1  . prochlorperazine (COMPAZINE) 10 MG tablet Take 1 tablet (10 mg total) by mouth every 6 (six) hours as needed (Nausea or vomiting). (Patient not taking: Reported on 01/27/2016) 30 tablet 6  . sodium fluoride (FLUORISHIELD) 1.1 % GEL dental gel Instill one drop of gel per tooth space of fluoride tray. Place over teeth for 5 minutes. Remove. Spit out excess. Repeat nightly. 120 mL prn  . sucralfate (CARAFATE) 1 g tablet Dissolve 1 tablet in 23m74mO and swallow up to QID,PRN sore throat. (Patient not taking: Reported on 02/13/2016) 60 tablet 5   No current facility-administered medications for this visit.     PHYSICAL EXAMINATION: ECOG PERFORMANCE STATUS: 1 - Symptomatic but completely ambulatory  Vitals:   02/13/16 0837  BP: 114/62  Pulse: 81  Resp: 18  Temp: 98.4 F (36.9 C)   Filed Weights   02/13/16 0837  Weight: 196 lb (88.9 kg)    GENERAL:alert, no distress and comfortable SKIN: skin color, texture, turgor are normal, no rashes or significant lesions EYES: normal, Conjunctiva are pink and non-injected, sclera clear OROPHARYNX:no  exudate, no erythema and lips, buccal mucosa, and tongue normal  NECK: supple, thyroid normal size, non-tender, without nodularity LYMPH:  no palpable lymphadenopathy in the cervical, axillary or inguinal LUNGS: clear to auscultation and percussion with normal breathing effort HEART: regular rate & rhythm and no murmurs and no lower extremity edema ABDOMEN:abdomen soft, non-tender and normal bowel sounds Musculoskeletal:no cyanosis of digits and no clubbing  NEURO: alert & oriented x 3 with fluent  speech, no focal motor/sensory deficits  LABORATORY DATA:  I have reviewed the data as listed    Component Value Date/Time   NA 137 01/21/2016 0857   K 4.1 01/21/2016 0857   CL 103 10/09/2015 0900   CO2 28 01/21/2016 0857   GLUCOSE 112 01/21/2016 0857   BUN 24.0 01/21/2016 0857   CREATININE 0.9 01/21/2016 0857   CALCIUM 9.5 01/21/2016 0857   PROT 7.0 01/08/2016 0905   ALBUMIN 3.1 (L) 01/08/2016 0905   AST 19 01/08/2016 0905   ALT 22 01/08/2016 0905   ALKPHOS 70 01/08/2016 0905   BILITOT 0.47 01/08/2016 0905   GFRNONAA >60 10/09/2015 0900   GFRAA >60 10/09/2015 0900    No results found for: SPEP, UPEP  Lab Results  Component Value Date   WBC 4.8 01/08/2016   NEUTROABS 3.2 01/08/2016   HGB 10.7 (L) 01/08/2016   HCT 32.7 (L) 01/08/2016   MCV 87.5 01/08/2016   PLT 226 01/08/2016      Chemistry      Component Value Date/Time   NA 137 01/21/2016 0857   K 4.1 01/21/2016 0857   CL 103 10/09/2015 0900   CO2 28 01/21/2016 0857   BUN 24.0 01/21/2016 0857   CREATININE 0.9 01/21/2016 0857      Component Value Date/Time   CALCIUM 9.5 01/21/2016 0857   ALKPHOS 70 01/08/2016 0905   AST 19 01/08/2016 0905   ALT 22 01/08/2016 0905   BILITOT 0.47 01/08/2016 0905      ASSESSMENT & PLAN:  Diffuse large B-cell lymphoma of lymph nodes of neck ([place]) She has completed all treatment I recommend we keep the port patent for now I plan to repeat imaging study in 3 months and if the repeat study is negative, will get the port removed She is recovering well from recent side effects from treatment    Orders Placed This Encounter  Procedures  . NM PET Image Restag (PS) Skull Base To Thigh    Standing Status:   Future    Standing Expiration Date:   03/19/2017    Order Specific Question:   Reason for exam:    Answer:   staging lymphoma, assess response to Rx    Order Specific Question:   Preferred imaging location?    Answer:   [facility]   All questions were answered.  The patient knows to call the clinic with any problems, questions or concerns. No barriers to learning was detected. I spent 15 minutes counseling the patient face to face. The total time spent in the appointment was 20 minutes and more than 50% was on counseling and review of test results     Heath Lark, MD 02/13/2016 10:03 AM

## 2016-02-13 NOTE — Assessment & Plan Note (Signed)
She has completed all treatment I recommend we keep the port patent for now I plan to repeat imaging study in 3 months and if the repeat study is negative, will get the port removed She is recovering well from recent side effects from treatment

## 2016-02-13 NOTE — Progress Notes (Signed)
Radiation Oncology         [phone] ________________________________  Name: Charlotte Davidson MRN: [id number]  Date: 02/13/2016  DOB: [birth year]  Follow-Up Visit Note  CC: Haywood Pao, MD  Heath Lark, MD  Diagnosis and Prior Radiotherapy:       ICD-9-CM ICD-10-CM   1. Diffuse large B-cell lymphoma of lymph nodes of neck (HCC) 202.81 C83.31     CHIEF COMPLAINT:  Here for follow-up and surveillance of lymphoma of lymph nodes of neck 01/01/16 - 01/27/16 Involved Site Radiotherapy to 30.6 Gy  Narrative:  The patient returns today for routine follow-up of radiation completed to her Oropharynx on 01/27/16.                On review of systems, the patient denies any pain. She is not using a feeding tube. She reports swallowing well, and modifying her diet with some softer foods, or moistening foods before eating. The patient reports continued difficulties with taste and some dry mouth. She denies smoking or chewing tobacco. She reports using daily fluoride trays.  She denies any other concerns at this time.  She has lost 5 lbs. since 01/20/16.  Follows up again with Dr. Alvy Bimler in April and will have restaging scans at that time.  The patient is wearing a sterile face mask today, and reports she is just taking extra precautions against illness, but does not feel ill at this time.  ALLERGIES:  is allergic to latex; penicillins; and sulfa antibiotics.  Meds: Current Outpatient Prescriptions  Medication Sig Dispense Refill  . acetaminophen (TYLENOL) 325 MG tablet Take 650 mg by mouth every 6 (six) hours as needed.    . lidocaine-prilocaine (EMLA) cream Apply to affected area once 30 g 3  . sodium fluoride (FLUORISHIELD) 1.1 % GEL dental gel Instill one drop of gel per tooth space of fluoride tray. Place over teeth for 5 minutes. Remove. Spit out excess. Repeat nightly. 120 mL prn  . Ascorbic Acid (VITAMIN C PO) Take 1 tablet by mouth daily.    . Cholecalciferol (VITAMIN D PO) Take 1  tablet by mouth daily.    . fentaNYL (DURAGESIC - DOSED MCG/HR) 25 MCG/HR patch Place 1 patch (25 mcg total) onto the skin every 3 (three) days. (Patient not taking: Reported on 01/27/2016) 5 patch 0  . HYDROcodone-acetaminophen (HYCET) 7.5-325 mg/15 ml solution Take 10-80mL q 4hrs prn pain. (Patient not taking: Reported on 01/27/2016) 473 mL 0  . lidocaine (XYLOCAINE) 2 % solution Mix 1 part 2%viscous lidocaine,1part H2O.Swish and swallow 79mL of this mixture,44min before meals and at bedtime, up to QID (Patient not taking: Reported on 02/13/2016) 100 mL 5  . Morphine Sulfate (MORPHINE CONCENTRATE) 10 MG/0.5ML SOLN concentrated solution Take 0.5 mLs (10 mg total) by mouth every 2 (two) hours as needed. (Patient not taking: Reported on 01/27/2016) 120 mL 0  . Multiple Vitamins-Minerals (MULTIVITAMIN WITH MINERALS) tablet Take 1 tablet by mouth daily.    . naproxen sodium (ANAPROX) 220 MG tablet Take 220 mg by mouth 2 (two) times daily as needed (for pain).    . ondansetron (ZOFRAN) 8 MG tablet Take 1 tablet (8 mg total) by mouth every 8 (eight) hours as needed for refractory nausea / vomiting. (Patient not taking: Reported on 02/13/2016) 30 tablet 1  . prochlorperazine (COMPAZINE) 10 MG tablet Take 1 tablet (10 mg total) by mouth every 6 (six) hours as needed (Nausea or vomiting). (Patient not taking: Reported on 01/27/2016) 30 tablet 6  .  sucralfate (CARAFATE) 1 g tablet Dissolve 1 tablet in 2mL H2O and swallow up to QID,PRN sore throat. (Patient not taking: Reported on 02/13/2016) 60 tablet 5   No current facility-administered medications for this encounter.     Physical Findings: The patient is in no acute distress. Patient is alert and oriented. Wt Readings from Last 3 Encounters:  02/13/16 194 lb (88 kg)  02/13/16 196 lb (88.9 kg)  02/06/16 197 lb 6.4 oz (89.5 kg)    height is 5\' 4"  (1.626 m) and weight is 194 lb (88 kg). Her temperature is 97.8 F (36.6 C). Her blood pressure is 112/77 and her  pulse is 64. Her oxygen saturation is 100%. .  General: Alert and oriented, in no acute distress. HEENT: No oropharyngeal lesions noted. Neck: No palpable masses in the cervical or supraclavicular regions. Skin: Skin in treatment field over the neck shows satisfactory healing. Lymphatics: see Neck Exam  Lab Findings: Lab Results  Component Value Date   WBC 4.8 01/08/2016   HGB 10.7 (L) 01/08/2016   HCT 32.7 (L) 01/08/2016   MCV 87.5 01/08/2016   PLT 226 01/08/2016    Lab Results  Component Value Date   TSH 0.257 (L) 01/08/2016    Radiographic Findings: No results found.  Impression/Plan:    1) Head and Neck Cancer Status: NED, healing well from RT  2) Nutritional Status: 5lb los over 3 weeks.  Dysgeusia lingers but I expect this will improve over the next several months. PEG tube: None  3) Swallowing: No significant concerns at this time.  4) Thyroid function:  WNL - continue screening in Med/ Onc.  I will let Dr. Alvy Bimler know  Ref Range & Units 37mo ago  T4,Free(Direct) 0.82 - 1.77 ng/dL 1.29     Lab Results  Component Value Date   TSH 0.257 (L) 01/08/2016   5) The patient will have her next follow up scan with Dr. Alvy Bimler in April. She will follow up with me on an as needed basis. The patient was encouraged to call with any issues or questions that may arise.  I spent 15 minutes face to face with the patient and more than 50% of that time was spent in counseling and/or coordination of care. _____________________________________   Eppie Gibson, MD  This document serves as a record of services personally performed by Eppie Gibson, MD. It was created on her behalf by Maryla Morrow, a trained medical scribe. The creation of this record is based on the scribe's personal observations and the provider's statements to them. This document has been checked and approved by the attending provider.

## 2016-02-13 NOTE — Telephone Encounter (Signed)
Appointments scheduled per 02/13/16 los. Patient was given a copy of the appointment schedule and AVS report, per 02/13/16 los.

## 2016-02-16 ENCOUNTER — Ambulatory Visit (HOSPITAL_COMMUNITY): Payer: Medicaid - Dental | Admitting: Dentistry

## 2016-02-16 ENCOUNTER — Encounter (HOSPITAL_COMMUNITY): Payer: Self-pay | Admitting: Dentistry

## 2016-02-16 VITALS — BP 105/64 | HR 71 | Temp 98.1°F | Wt 196.0 lb

## 2016-02-16 DIAGNOSIS — Z5189 Encounter for other specified aftercare: Secondary | ICD-10-CM

## 2016-02-16 DIAGNOSIS — Z9221 Personal history of antineoplastic chemotherapy: Secondary | ICD-10-CM

## 2016-02-16 DIAGNOSIS — Z923 Personal history of irradiation: Secondary | ICD-10-CM

## 2016-02-16 DIAGNOSIS — K117 Disturbances of salivary secretion: Secondary | ICD-10-CM

## 2016-02-16 DIAGNOSIS — R682 Dry mouth, unspecified: Secondary | ICD-10-CM

## 2016-02-16 DIAGNOSIS — C8331 Diffuse large B-cell lymphoma, lymph nodes of head, face, and neck: Secondary | ICD-10-CM

## 2016-02-16 DIAGNOSIS — R432 Parageusia: Secondary | ICD-10-CM

## 2016-02-16 DIAGNOSIS — Z0189 Encounter for other specified special examinations: Secondary | ICD-10-CM

## 2016-02-16 NOTE — Progress Notes (Signed)
02/16/2016  Patient Name:   Charlotte Davidson Date of Birth:   [birth year] Medical Record Number: [id number]  BP 105/64 (BP Location: Left Arm)   Pulse 71   Temp 98.1 F (36.7 C) (Oral)   Wt 196 lb (88.9 kg)   LMP 10/09/2011 (Approximate)   BMI 33.64 kg/m   [name] presents for oral examination after induction chemotherapy followed by radiation therapy. Patient has completed all treatments from 01/01/16 thru 01/27/16.  REVIEW OF CHIEF COMPLAINTS:  DRY MOUTH: Yes HARD TO SWALLOW: No  HURT TO SWALLOW: No TASTE CHANGES: Taste is slowly returning. SORES IN MOUTH: No TRISMUS: No problems with trismus WEIGHT: 196 pounds  HOME OH REGIMEN:  BRUSHING: Twice a day FLOSSING: Once a day RINSING: Rinsing with salt water and Biotene rinses FLUORIDE: Using fluoride at night TRISMUS EXERCISES:  Maximum interincisal opening: 50 mm   DENTAL EXAM:  Oral Hygiene:(PLAQUE): Good oral hygiene LOCATION OF MUCOSITIS: None noted DESCRIPTION OF SALIVA: Decreased saliva ANY EXPOSED BONE: None noted OTHER WATCHED AREAS: NA DX: Xerostomia and Dysgeusia  RECOMMENDATIONS: 1. Brush after meals and at bedtime.  Use fluoride at bedtime. 2. Use trismus exercises as directed. 3. Use Biotene Rinse or salt water/baking soda rinses. 4. Multiple sips of water as needed. 5. Return to Dr. Norman Herrlich for follow up as scheduled.    Lenn Cal, DDS

## 2016-02-16 NOTE — Patient Instructions (Signed)
RECOMMENDATIONS: 1. Brush after meals and at bedtime.  Use fluoride at bedtime. 2. Use trismus exercises as directed. 3. Use Biotene Rinse or salt water/baking soda rinses. 4. Multiple sips of water as needed. 5. Return to Dr. Norman Herrlich for follow up as scheduled.   Lenn Cal, DDS

## 2016-03-19 ENCOUNTER — Ambulatory Visit (HOSPITAL_BASED_OUTPATIENT_CLINIC_OR_DEPARTMENT_OTHER): Payer: 59

## 2016-03-19 ENCOUNTER — Encounter (INDEPENDENT_AMBULATORY_CARE_PROVIDER_SITE_OTHER): Payer: Self-pay

## 2016-03-19 DIAGNOSIS — C8331 Diffuse large B-cell lymphoma, lymph nodes of head, face, and neck: Secondary | ICD-10-CM | POA: Diagnosis not present

## 2016-03-19 DIAGNOSIS — R110 Nausea: Secondary | ICD-10-CM

## 2016-03-19 DIAGNOSIS — Z452 Encounter for adjustment and management of vascular access device: Secondary | ICD-10-CM

## 2016-03-19 DIAGNOSIS — T451X5A Adverse effect of antineoplastic and immunosuppressive drugs, initial encounter: Principal | ICD-10-CM

## 2016-03-19 MED ORDER — SODIUM CHLORIDE 0.9 % IJ SOLN
10.0000 mL | INTRAMUSCULAR | Status: AC | PRN
  Administered 2016-03-19: 10 mL
  Filled 2016-03-19: qty 10

## 2016-03-19 MED ORDER — HEPARIN SOD (PORK) LOCK FLUSH 100 UNIT/ML IV SOLN
500.0000 [IU] | Freq: Once | INTRAVENOUS | Status: AC | PRN
  Administered 2016-03-19: 500 [IU]
  Filled 2016-03-19: qty 5

## 2016-03-22 ENCOUNTER — Telehealth: Payer: Self-pay | Admitting: *Deleted

## 2016-03-22 NOTE — Telephone Encounter (Signed)
LM with message below. Requested call back

## 2016-03-22 NOTE — Telephone Encounter (Signed)
Dry mouth: suck on ice chips Boil: take pictures. If she cannot come today, I can work her in on Wednesday afternoon

## 2016-03-22 NOTE — Telephone Encounter (Signed)
Pt left message requesting something stronger than Biotene for dry mouth. States she "cannot eat good".  Reports a bump in the crease of her leg- is not sure if it is a "boil". Does not know if Dr Alvy Bimler needs to see it.

## 2016-05-10 ENCOUNTER — Ambulatory Visit (HOSPITAL_COMMUNITY): Payer: 59

## 2016-05-13 ENCOUNTER — Ambulatory Visit: Payer: 59

## 2016-05-13 ENCOUNTER — Ambulatory Visit (HOSPITAL_COMMUNITY)
Admission: RE | Admit: 2016-05-13 | Discharge: 2016-05-13 | Disposition: A | Discharge status: Home / Self Care | Payer: 59 | Source: Ambulatory Visit | Attending: Hematology and Oncology | Admitting: Hematology and Oncology

## 2016-05-13 ENCOUNTER — Other Ambulatory Visit (HOSPITAL_BASED_OUTPATIENT_CLINIC_OR_DEPARTMENT_OTHER): Payer: 59

## 2016-05-13 DIAGNOSIS — R911 Solitary pulmonary nodule: Secondary | ICD-10-CM | POA: Diagnosis not present

## 2016-05-13 DIAGNOSIS — R946 Abnormal results of thyroid function studies: Secondary | ICD-10-CM

## 2016-05-13 DIAGNOSIS — I700 Atherosclerosis of aorta: Secondary | ICD-10-CM | POA: Diagnosis not present

## 2016-05-13 DIAGNOSIS — R110 Nausea: Secondary | ICD-10-CM

## 2016-05-13 DIAGNOSIS — C8331 Diffuse large B-cell lymphoma, lymph nodes of head, face, and neck: Secondary | ICD-10-CM

## 2016-05-13 DIAGNOSIS — T451X5A Adverse effect of antineoplastic and immunosuppressive drugs, initial encounter: Secondary | ICD-10-CM

## 2016-05-13 DIAGNOSIS — R7989 Other specified abnormal findings of blood chemistry: Secondary | ICD-10-CM

## 2016-05-13 LAB — CBC WITH DIFFERENTIAL/PLATELET
BASO%: 0.6 % (ref 0.0–2.0)
Basophils Absolute: 0 10*3/uL (ref 0.0–0.1)
EOS%: 2.3 % (ref 0.0–7.0)
Eosinophils Absolute: 0.1 10*3/uL (ref 0.0–0.5)
HCT: 35.5 % (ref 34.8–46.6)
HEMOGLOBIN: 11.7 g/dL (ref 11.6–15.9)
LYMPH#: 0.8 10*3/uL — AB (ref 0.9–3.3)
LYMPH%: 27.3 % (ref 14.0–49.7)
MCH: 28.5 pg (ref 25.1–34.0)
MCHC: 33.1 g/dL (ref 31.5–36.0)
MCV: 86.2 fL (ref 79.5–101.0)
MONO#: 0.4 10*3/uL (ref 0.1–0.9)
MONO%: 11.8 % (ref 0.0–14.0)
NEUT%: 58 % (ref 38.4–76.8)
NEUTROS ABS: 1.8 10*3/uL (ref 1.5–6.5)
PLATELETS: 208 10*3/uL (ref 145–400)
RBC: 4.12 10*6/uL (ref 3.70–5.45)
RDW: 13.8 % (ref 11.2–14.5)
WBC: 3 10*3/uL — AB (ref 3.9–10.3)

## 2016-05-13 LAB — COMPREHENSIVE METABOLIC PANEL
ALT: 16 U/L (ref 0–55)
ANION GAP: 9 meq/L (ref 3–11)
AST: 15 U/L (ref 5–34)
Albumin: 3.4 g/dL — ABNORMAL LOW (ref 3.5–5.0)
Alkaline Phosphatase: 53 U/L (ref 40–150)
BILIRUBIN TOTAL: 0.48 mg/dL (ref 0.20–1.20)
BUN: 15.3 mg/dL (ref 7.0–26.0)
CALCIUM: 9.1 mg/dL (ref 8.4–10.4)
CO2: 24 mEq/L (ref 22–29)
CREATININE: 0.7 mg/dL (ref 0.6–1.1)
Chloride: 108 mEq/L (ref 98–109)
EGFR: >90 mL/min/{1.73_m2} (ref >90)
Glucose: 103 mg/dl (ref 70–140)
Potassium: 3.9 mEq/L (ref 3.5–5.1)
Sodium: 141 mEq/L (ref 136–145)
TOTAL PROTEIN: 6.9 g/dL (ref 6.4–8.3)

## 2016-05-13 LAB — GLUCOSE, CAPILLARY: GLUCOSE-CAPILLARY: 107 mg/dL — AB (ref 65–99)

## 2016-05-13 LAB — TSH: TSH: 1.24 m[IU]/L (ref 0.308–3.960)

## 2016-05-13 MED ORDER — ALTEPLASE 2 MG IJ SOLR
2.0000 mg | Freq: Once | INTRAMUSCULAR | Status: DC | PRN
  Filled 2016-05-13: qty 2

## 2016-05-13 MED ORDER — SODIUM CHLORIDE 0.9 % IJ SOLN
10.0000 mL | INTRAMUSCULAR | Status: AC | PRN
  Administered 2016-05-13: 10 mL
  Filled 2016-05-13: qty 10

## 2016-05-13 MED ORDER — FLUDEOXYGLUCOSE F - 18 (FDG) INJECTION
9.7500 | Freq: Once | INTRAVENOUS | Status: AC | PRN
  Administered 2016-05-13: 9.75 via INTRAVENOUS

## 2016-05-13 MED ORDER — HEPARIN SOD (PORK) LOCK FLUSH 100 UNIT/ML IV SOLN
250.0000 [IU] | Freq: Once | INTRAVENOUS | Status: DC | PRN
  Filled 2016-05-13: qty 5

## 2016-05-13 MED ORDER — SODIUM CHLORIDE 0.9 % IV SOLN: Freq: Once | INTRAVENOUS | Status: DC

## 2016-05-13 MED ORDER — PROMETHAZINE HCL 25 MG/ML IJ SOLN
25.0000 mg | Freq: Once | INTRAMUSCULAR | Status: DC
  Filled 2016-05-13: qty 1

## 2016-05-13 MED ORDER — HEPARIN SOD (PORK) LOCK FLUSH 100 UNIT/ML IV SOLN
500.0000 [IU] | Freq: Once | INTRAVENOUS | Status: DC | PRN
  Filled 2016-05-13: qty 5

## 2016-05-13 MED ORDER — SODIUM CHLORIDE 0.9 % IJ SOLN
10.0000 mL | INTRAMUSCULAR | Status: DC | PRN
  Filled 2016-05-13: qty 10

## 2016-05-13 NOTE — Patient Instructions (Signed)

## 2016-05-14 ENCOUNTER — Telehealth: Payer: Self-pay | Admitting: Hematology and Oncology

## 2016-05-14 ENCOUNTER — Ambulatory Visit (HOSPITAL_BASED_OUTPATIENT_CLINIC_OR_DEPARTMENT_OTHER): Payer: 59 | Admitting: Hematology and Oncology

## 2016-05-14 VITALS — BP 132/74 | HR 64 | Temp 98.1°F | Resp 18 | Ht 64.0 in | Wt 194.7 lb

## 2016-05-14 DIAGNOSIS — C8331 Diffuse large B-cell lymphoma, lymph nodes of head, face, and neck: Secondary | ICD-10-CM | POA: Diagnosis not present

## 2016-05-14 DIAGNOSIS — D72818 Other decreased white blood cell count: Secondary | ICD-10-CM

## 2016-05-14 LAB — T4, FREE: T4,Free(Direct): 1.34 ng/dL (ref 0.82–1.77)

## 2016-05-14 NOTE — Telephone Encounter (Signed)
Appointments scheduled per 05/14/16 los. Patient was given a copy of the AVS report and appointment schedule per 05/14/16 los.

## 2016-05-15 ENCOUNTER — Encounter: Payer: Self-pay | Admitting: Hematology and Oncology

## 2016-05-15 DIAGNOSIS — D72819 Decreased white blood cell count, unspecified: Secondary | ICD-10-CM | POA: Insufficient documentation

## 2016-05-15 NOTE — Assessment & Plan Note (Addendum)
She has completed all treatment She is recovering well from recent side effects from treatment I recommend removal of port. I will see her back in 3 months with repeat history, physical examination and blood work only We will get her imaging study review at the next tumor board.

## 2016-05-15 NOTE — Assessment & Plan Note (Signed)
She has chronic leukopenia, likely due to residual side effects from treatment Overall, she is not symptomatic Observe only.

## 2016-05-15 NOTE — Progress Notes (Signed)
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       09/24/2015 Pathology Results    [id number] The nasopharyngeal biopsy shows lymphoid tissue with diffuse effacement of the normal nodal architecture by large cells with vesicular nuclei, prominent nucleoli, and scant cytoplasm. The large cells are positive for CD20, BCL-6, BCL-2, CD5, and MUM1 by immunohistochemistry. They are negative for CD30, CD3, CD10, cyclinD1 and SOX11. In situ hybridization for EBV mRNA is negative. Ki-67 reveals a proliferation rate of approximately 60%. By flow cytometry, the atypical cells express CD19, CD5, and CD23 (see below). The expression of CD5 and CD23 by flow cytometry is suggestive of small lymphocytic lymphoma/ chronic lymphocytic leukemia (SLL/CLL) transforming to a diffuse large B-cell lymphoma (DLBCL); however, there was no evidence of SLL/CLL in the flow sample. Correlation with peripheral blood/ marrow findings would be useful to determine if this lymphoma arose from background SLL/CLL or is a de novo CD5+ DLBCL. De novo CD5+ DLBCL is frequently associated with an aggressive clinical course and poor response to chemotherapy. A pleomorphic mantle cell lymphoma is less likely since the cells are negative for Cyclin D1 and SOX11. FISH studies  are pending and will be reported as an addendum.       09/30/2015 Imaging    CT scan of neck showed bulky RIGHT neck lymphadenopathy concerning for metastatic disease. 8 mm submucosal RIGHT base of tongue mass, considering ipsilateral lymphadenopathy, this is suspicious for primary head and neck cancer. Isodense LEFT palatine tonsillar mass versus focal lymphoid hyperplasia resulting in LEFT postobstructive middle ear/mastoid effusion.       10/03/2015 Imaging    ECHO showed normal EF      10/08/2015 PET scan    Hypermetabolic left nasopharyngeal and tongue base masses with hypermetabolic bilateral neck adenopathy. Right level 2 adenopathy extends inferiorly into the low right internal jugular station. No additional evidence of metastatic disease in the chest, abdomen or pelvis.      10/09/2015 Procedure    Successful placement of a left internal jugular approach power injectable Port-A-Cath. The catheter is ready for immediate use.      10/13/2015 - 11/24/2015 Chemotherapy    She received 3 cycles of chemotherapy with R-CHOP      12/15/2015 PET scan    Interval complete resolution of hypermetabolic activity associated with the left tonsillar mass and multiple cervical lymph nodes consistent with complete remission. No residual hypermetabolic activity demonstrated.      01/01/2016 - 01/27/2016 Radiation Therapy    She completed radiation treatment      05/13/2016 PET scan    1. No recurrent abnormal hypermetabolic activity or adenopathy to suggest recurrent active lymphoma. 2. 2 by 3 mm left lower lobe pulmonary nodule, no change from earliest available comparison of 10/25/2015, not visibly hypermetabolic but below sensitive PET-CT size thresholds, very likely to be benign but merits surveillance on any follow up.  3. Tiny focus of hypermetabolic activity along the cutaneous surface of the right perineum without CT correlate, likely from contamination from minimal urinary incontinence. 4.  Aortic Atherosclerosis (ICD10-I70.0).       INTERVAL HISTORY: Please see below for problem oriented charting. She is seen for further follow-up. She is doing well. Denies mucositis, nausea or vomiting. No new lymphadenopathy. Her energy level is stable. Denies recent infection.  REVIEW OF SYSTEMS:   Constitutional: Denies fevers, chills or abnormal weight loss Eyes: Denies blurriness of vision Ears, nose, mouth, throat, and face: Denies mucositis or sore throat Respiratory: Denies cough, dyspnea or wheezes Cardiovascular: Denies palpitation, chest discomfort or lower extremity swelling Gastrointestinal:  Denies nausea, heartburn or change in bowel habits Skin: Denies abnormal skin rashes Lymphatics: Denies new lymphadenopathy or easy bruising Neurological:Denies numbness, tingling or new weaknesses Behavioral/Psych: Mood is stable, no new changes  All other systems were reviewed with the patient and are negative.  I have reviewed the past medical history, past surgical history, social history and family history with the patient and they are unchanged from previous note.  ALLERGIES:  is allergic to latex; penicillins; and sulfa antibiotics.  MEDICATIONS:  Current Outpatient Prescriptions  Medication Sig Dispense Refill  . Ascorbic Acid (VITAMIN C PO) Take 1 tablet by mouth daily.    . Cholecalciferol (VITAMIN D PO) Take 1 tablet by mouth daily.    Marland Kitchen lidocaine-prilocaine (EMLA) cream Apply to affected area once 30 g 3  . Multiple Vitamins-Minerals (MULTIVITAMIN WITH MINERALS) tablet Take 1 tablet by mouth daily.    Marland Kitchen acetaminophen (TYLENOL) 325 MG tablet Take 650 mg by mouth every 6 (six) hours as needed.    . fentaNYL (DURAGESIC - DOSED MCG/HR) 25 MCG/HR patch Place 1 patch (25 mcg total) onto the skin every 3 (three) days. (Patient not taking: Reported on 01/27/2016) 5 patch 0  . HYDROcodone-acetaminophen (HYCET) 7.5-325 mg/15 ml solution Take 10-40m q 4hrs prn pain. (Patient  not taking: Reported on 01/27/2016) 473 mL 0  . lidocaine (XYLOCAINE) 2 % solution Mix 1 part 2%viscous lidocaine,1part H2O.Swish and swallow 132mof this mixture,3036mbefore meals and at bedtime, up to QID (Patient not taking: Reported on 02/13/2016) 100 mL 5  . Morphine Sulfate (MORPHINE CONCENTRATE) 10 MG/0.5ML SOLN concentrated solution Take 0.5 mLs (10 mg total) by mouth every 2 (two) hours as needed. (Patient not taking: Reported on 01/27/2016) 120 mL 0  . naproxen sodium (ANAPROX) 220 MG tablet Take 220 mg by mouth 2 (two) times daily as needed (for pain).    . ondansetron (ZOFRAN) 8 MG tablet Take 1 tablet (8 mg total) by mouth every 8 (eight) hours as needed for refractory nausea / vomiting. (Patient not taking: Reported on 02/13/2016) 30 tablet 1  . prochlorperazine (COMPAZINE) 10 MG tablet Take 1 tablet (10 mg total) by mouth every 6 (six) hours as needed (Nausea or vomiting). (Patient not taking: Reported on 01/27/2016) 30 tablet 6  . sodium fluoride (FLUORISHIELD) 1.1 % GEL dental gel Instill one drop of gel per tooth space of fluoride tray. Place over teeth for 5 minutes. Remove. Spit out excess. Repeat nightly. (Patient not taking: Reported on 05/14/2016) 120 mL prn  . sucralfate (CARAFATE) 1 g tablet Dissolve 1 tablet in 37m66mO and swallow up to QID,PRN sore throat. (Patient not taking: Reported on 02/13/2016) 60 tablet 5   No current facility-administered medications for this visit.     PHYSICAL EXAMINATION: ECOG PERFORMANCE STATUS: 0 - Asymptomatic  Vitals:  05/14/16 0928  BP: 132/74  Pulse: 64  Resp: 18  Temp: 98.1 F (36.7 C)   Filed Weights   05/14/16 0928  Weight: 194 lb 11.2 oz (88.3 kg)    GENERAL:alert, no distress and comfortable SKIN: skin color, texture, turgor are normal, no rashes or significant lesions EYES: normal, Conjunctiva are pink and non-injected, sclera clear OROPHARYNX:no exudate, no erythema and lips, buccal mucosa, and tongue normal  NECK: supple,  thyroid normal size, non-tender, without nodularity LYMPH:  no palpable lymphadenopathy in the cervical, axillary or inguinal LUNGS: clear to auscultation and percussion with normal breathing effort HEART: regular rate & rhythm and no murmurs and no lower extremity edema ABDOMEN:abdomen soft, non-tender and normal bowel sounds Musculoskeletal:no cyanosis of digits and no clubbing  NEURO: alert & oriented x 3 with fluent speech, no focal motor/sensory deficits  LABORATORY DATA:  I have reviewed the data as listed    Component Value Date/Time   NA 141 05/13/2016 0825   K 3.9 05/13/2016 0825   CL 103 10/09/2015 0900   CO2 24 05/13/2016 0825   GLUCOSE 103 05/13/2016 0825   BUN 15.3 05/13/2016 0825   CREATININE 0.7 05/13/2016 0825   CALCIUM 9.1 05/13/2016 0825   PROT 6.9 05/13/2016 0825   ALBUMIN 3.4 (L) 05/13/2016 0825   AST 15 05/13/2016 0825   ALT 16 05/13/2016 0825   ALKPHOS 53 05/13/2016 0825   BILITOT 0.48 05/13/2016 0825   GFRNONAA >60 10/09/2015 0900   GFRAA >60 10/09/2015 0900    No results found for: SPEP, UPEP  Lab Results  Component Value Date   WBC 3.0 (L) 05/13/2016   NEUTROABS 1.8 05/13/2016   HGB 11.7 05/13/2016   HCT 35.5 05/13/2016   MCV 86.2 05/13/2016   PLT 208 05/13/2016      Chemistry      Component Value Date/Time   NA 141 05/13/2016 0825   K 3.9 05/13/2016 0825   CL 103 10/09/2015 0900   CO2 24 05/13/2016 0825   BUN 15.3 05/13/2016 0825   CREATININE 0.7 05/13/2016 0825      Component Value Date/Time   CALCIUM 9.1 05/13/2016 0825   ALKPHOS 53 05/13/2016 0825   AST 15 05/13/2016 0825   ALT 16 05/13/2016 0825   BILITOT 0.48 05/13/2016 0825       RADIOGRAPHIC STUDIES: I have reviewed imaging study with the patient and her husband I have personally reviewed the radiological images as listed and agreed with the findings in the report. Nm Pet Image Restag (ps) Skull Base To Thigh  Result Date: 05/13/2016 CLINICAL DATA:  Subsequent  treatment strategy for diffuse large B-cell lymphoma. EXAM: NUCLEAR MEDICINE PET SKULL BASE TO THIGH TECHNIQUE: 9.8 mCi F-18 FDG was injected intravenously. Full-ring PET imaging was performed from the skull base to thigh after the radiotracer. CT data was obtained and used for attenuation correction and anatomic localization. FASTING BLOOD GLUCOSE:  Value: 107 mg/dl COMPARISON:  Multiple exams, including 12/15/2015 FINDINGS: NECK Originally there is a left tonsillar mass and extensive hypermetabolic neck adenopathy on 10/08/2015. This subsequently cleared on the prior PET-CT of 12/15/2015. On today' s exam, there is no recurrent hypermetabolic tonsillar mass and no recurrent hypermetabolic adenopathy in the neck. Mild physiologic glottic activity is observed. CHEST No hypermetabolic mediastinal or hilar nodes. Left subclavian Port-A-Cath tip: Right atrium. Mild aortic arch atherosclerotic calcification. 2 by 3 mm left lower lobe nodule on image 35/7, no change from earliest available comparison of 10/25/2015, not hypermetabolic. ABDOMEN/PELVIS  No abnormal hypermetabolic activity within the liver, pancreas, adrenal glands, or spleen. No hypermetabolic lymph nodes in the abdomen or pelvis. Cholecystectomy. 5 mm hypodense lesion in the lateral segment left hepatic lobe on image 82/4 is technically nonspecific but statistically highly likely to be benign, and not visibly hypermetabolic. There is a trace focus of hypermetabolic activity along the cutaneous surface of the right perineum, no definite CT correlate, highly likely to be contamination from minimal urinary incontinence. SKELETON No focal hypermetabolic activity to suggest skeletal metastasis. IMPRESSION: 1. No recurrent abnormal hypermetabolic activity or adenopathy to suggest recurrent active lymphoma. 2. 2 by 3 mm left lower lobe pulmonary nodule, no change from earliest available comparison of 10/25/2015, not visibly hypermetabolic but below sensitive  PET-CT size thresholds, very likely to be benign but merits surveillance on any follow up. 3. Tiny focus of hypermetabolic activity along the cutaneous surface of the right perineum without CT correlate, likely from contamination from minimal urinary incontinence. 4.  Aortic Atherosclerosis (ICD10-I70.0). Electronically Signed   By: Van Clines M.D.   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 I will see her back in 3 months with repeat history, physical examination and blood work only We will get her imaging study review at the next tumor board.   Leukopenia She has chronic leukopenia, likely due to residual side effects from treatment Overall, she is not symptomatic Observe only.   Orders Placed This Encounter  Procedures  . IR Removal Tun Access W/ Port W/O FL    Standing Status:   Future    Standing Expiration Date:   07/14/2017    Order Specific Question:   Reason for exam:    Answer:   no need port    Order Specific Question:   Is the patient pregnant?    Answer:   No    Order Specific Question:   Preferred Imaging Location?    Answer:   [facility]   All questions were answered. The patient knows to call the clinic with any problems, questions or concerns. No barriers to learning was detected. I spent 15 minutes counseling the patient face to face. The total time spent in the appointment was 20 minutes and more than 50% was on counseling and review of test results     Heath Lark, MD 05/15/2016 7:26 AM

## 2016-05-28 ENCOUNTER — Other Ambulatory Visit: Payer: Self-pay | Admitting: General Surgery

## 2016-05-31 ENCOUNTER — Encounter (HOSPITAL_COMMUNITY): Payer: Self-pay

## 2016-05-31 ENCOUNTER — Ambulatory Visit (HOSPITAL_COMMUNITY)
Admission: RE | Admit: 2016-05-31 | Discharge: 2016-05-31 | Disposition: A | Discharge status: Home / Self Care | Payer: 59 | Source: Ambulatory Visit | Attending: Hematology and Oncology | Admitting: Hematology and Oncology

## 2016-05-31 DIAGNOSIS — Z880 Allergy status to penicillin: Secondary | ICD-10-CM | POA: Insufficient documentation

## 2016-05-31 DIAGNOSIS — Z923 Personal history of irradiation: Secondary | ICD-10-CM | POA: Diagnosis not present

## 2016-05-31 DIAGNOSIS — Z9221 Personal history of antineoplastic chemotherapy: Secondary | ICD-10-CM | POA: Insufficient documentation

## 2016-05-31 DIAGNOSIS — Z91040 Latex allergy status: Secondary | ICD-10-CM | POA: Diagnosis not present

## 2016-05-31 DIAGNOSIS — Z882 Allergy status to sulfonamides status: Secondary | ICD-10-CM | POA: Insufficient documentation

## 2016-05-31 DIAGNOSIS — C8331 Diffuse large B-cell lymphoma, lymph nodes of head, face, and neck: Secondary | ICD-10-CM

## 2016-05-31 DIAGNOSIS — Z8572 Personal history of non-Hodgkin lymphomas: Secondary | ICD-10-CM | POA: Diagnosis not present

## 2016-05-31 DIAGNOSIS — Z452 Encounter for adjustment and management of vascular access device: Secondary | ICD-10-CM | POA: Diagnosis present

## 2016-05-31 HISTORY — DX: Personal history of antineoplastic chemotherapy: Z92.21

## 2016-05-31 HISTORY — PX: IR REMOVAL TUN ACCESS W/ PORT W/O FL MOD SED: IMG2290

## 2016-05-31 HISTORY — DX: Dry mouth, unspecified: R68.2

## 2016-05-31 LAB — CBC WITH DIFFERENTIAL/PLATELET
BASOS ABS: 0 10*3/uL (ref 0.0–0.1)
Basophils Relative: 0 %
EOS PCT: 2 %
Eosinophils Absolute: 0.1 10*3/uL (ref 0.0–0.7)
HEMATOCRIT: 35.5 % — AB (ref 36.0–46.0)
Hemoglobin: 11.9 g/dL — ABNORMAL LOW (ref 12.0–15.0)
LYMPHS PCT: 34 %
Lymphs Abs: 1.1 10*3/uL (ref 0.7–4.0)
MCH: 29.1 pg (ref 26.0–34.0)
MCHC: 33.5 g/dL (ref 30.0–36.0)
MCV: 86.8 fL (ref 78.0–100.0)
Monocytes Absolute: 0.2 10*3/uL (ref 0.1–1.0)
Monocytes Relative: 7 %
NEUTROS ABS: 1.8 10*3/uL (ref 1.7–7.7)
Neutrophils Relative %: 57 %
PLATELETS: 211 10*3/uL (ref 150–400)
RBC: 4.09 MIL/uL (ref 3.87–5.11)
RDW: 13.7 % (ref 11.5–15.5)
WBC: 3.2 10*3/uL — AB (ref 4.0–10.5)

## 2016-05-31 LAB — PROTIME-INR
INR: 1.01
PROTHROMBIN TIME: 13.3 s (ref 11.4–15.2)

## 2016-05-31 MED ORDER — FENTANYL CITRATE (PF) 100 MCG/2ML IJ SOLN
INTRAMUSCULAR | Status: AC
  Filled 2016-05-31: qty 4

## 2016-05-31 MED ORDER — VANCOMYCIN HCL IN DEXTROSE 1-5 GM/200ML-% IV SOLN
INTRAVENOUS | Status: AC
  Filled 2016-05-31: qty 200

## 2016-05-31 MED ORDER — LIDOCAINE-EPINEPHRINE (PF) 2 %-1:200000 IJ SOLN
INTRAMUSCULAR | Status: AC
  Filled 2016-05-31: qty 20

## 2016-05-31 MED ORDER — FLUMAZENIL 0.5 MG/5ML IV SOLN
INTRAVENOUS | Status: AC
  Filled 2016-05-31: qty 5

## 2016-05-31 MED ORDER — FENTANYL CITRATE (PF) 100 MCG/2ML IJ SOLN
INTRAMUSCULAR | Status: AC | PRN
  Administered 2016-05-31 (×2): 50 ug via INTRAVENOUS

## 2016-05-31 MED ORDER — NALOXONE HCL 0.4 MG/ML IJ SOLN
INTRAMUSCULAR | Status: AC
  Filled 2016-05-31: qty 1

## 2016-05-31 MED ORDER — VANCOMYCIN HCL IN DEXTROSE 1-5 GM/200ML-% IV SOLN
1000.0000 mg | INTRAVENOUS | Status: AC
  Administered 2016-05-31: 1000 mg via INTRAVENOUS

## 2016-05-31 MED ORDER — SODIUM CHLORIDE 0.9 % IV SOLN
INTRAVENOUS | Status: DC
  Administered 2016-05-31: 11:00:00 via INTRAVENOUS

## 2016-05-31 MED ORDER — MIDAZOLAM HCL 2 MG/2ML IJ SOLN
INTRAMUSCULAR | Status: AC
  Filled 2016-05-31: qty 4

## 2016-05-31 MED ORDER — MIDAZOLAM HCL 2 MG/2ML IJ SOLN
INTRAMUSCULAR | Status: AC | PRN
  Administered 2016-05-31 (×2): 1 mg via INTRAVENOUS

## 2016-05-31 NOTE — H&P (Signed)
Referring Physician(s): Heath Lark  Supervising Physician: Arne Cleveland  Patient Status:  [facility] OP  Chief Complaint:  "I'm here to get my port out"  Subjective: Pt familiar to IR service from prior left chest wall Port-A-Cath placement in September 2017. She has a history of large B-cell lymphoma and is status post chemoradiation. She presents again today for Port-A-Cath removal. She is currently asymptomatic.  Past Medical History:  Diagnosis Date  . Dry mouth   . History of chemotherapy   . History of radiation therapy 01/01/2016- 01/27/2016   Oropharynx  . Lymphoma [facility])    Past Surgical History:  Procedure Laterality Date  . BREAST SURGERY  01.17.2017   incise and drain right breast abscess  . CESAREAN SECTION  09.10.1996  . CHOLECYSTECTOMY    . INCISION AND DRAINAGE ABSCESS Right 02/11/2015   Procedure: INCISION AND DRAINAGE BREAST ABSCESS, RIGHT  BREAST BIOPSY;  Surgeon: Fanny Skates, MD;  Location: [facility] ORS;  Service: General;  Laterality: Right;  . IR GENERIC HISTORICAL  10/09/2015   IR FLUORO GUIDE PORT INSERTION LEFT 10/09/2015 Sandi Mariscal, MD [facility]-INTERV RAD  . IR GENERIC HISTORICAL  10/09/2015   IR US GUIDE VASC ACCESS LEFT 10/09/2015 Sandi Mariscal, MD [facility]-INTERV RAD  . TONSILLECTOMY      Allergies: Latex; Penicillins; and Sulfa antibiotics  Medications: Prior to Admission medications   Medication Sig Start Date End Date Taking? Authorizing Provider  Ascorbic Acid (VITAMIN C PO) Take 1 tablet by mouth daily.   Yes [provider]  Cholecalciferol (VITAMIN D PO) Take 1 tablet by mouth daily.   Yes [provider]  Multiple Vitamins-Minerals (MULTIVITAMIN WITH MINERALS) tablet Take 1 tablet by mouth daily.   Yes [provider]  acetaminophen (TYLENOL) 325 MG tablet Take 650 mg by mouth every 6 (six) hours as needed.    [provider]  fentaNYL (DURAGESIC - DOSED MCG/HR) 25 MCG/HR patch Place 1 patch (25 mcg total) onto the skin  every 3 (three) days. Patient not taking: Reported on 01/27/2016 01/23/16   Heath Lark, MD  HYDROcodone-acetaminophen (HYCET) 7.5-325 mg/15 ml solution Take 10-78mL q 4hrs prn pain. Patient not taking: Reported on 01/27/2016 01/20/16   Eppie Gibson, MD  lidocaine (XYLOCAINE) 2 % solution Mix 1 part 2%viscous lidocaine,1part H2O.Swish and swallow 56mL of this mixture,13min before meals and at bedtime, up to QID Patient not taking: Reported on 02/13/2016 01/12/16   Eppie Gibson, MD  lidocaine-prilocaine (EMLA) cream Apply to affected area once 10/10/15   Heath Lark, MD  Morphine Sulfate (MORPHINE CONCENTRATE) 10 MG/0.5ML SOLN concentrated solution Take 0.5 mLs (10 mg total) by mouth every 2 (two) hours as needed. 01/23/16   Heath Lark, MD  naproxen sodium (ANAPROX) 220 MG tablet Take 220 mg by mouth 2 (two) times daily as needed (for pain).    [provider]  ondansetron (ZOFRAN) 8 MG tablet Take 1 tablet (8 mg total) by mouth every 8 (eight) hours as needed for refractory nausea / vomiting. Patient not taking: Reported on 02/13/2016 10/10/15   Heath Lark, MD  prochlorperazine (COMPAZINE) 10 MG tablet Take 1 tablet (10 mg total) by mouth every 6 (six) hours as needed (Nausea or vomiting). Patient not taking: Reported on 01/27/2016 10/10/15   Heath Lark, MD  sodium fluoride (FLUORISHIELD) 1.1 % GEL dental gel Instill one drop of gel per tooth space of fluoride tray. Place over teeth for 5 minutes. Remove. Spit out excess. Repeat nightly. Patient not taking: Reported on 05/14/2016  12/17/15   Lenn Cal, DDS  sucralfate (CARAFATE) 1 g tablet Dissolve 1 tablet in 54mL H2O and swallow up to QID,PRN sore throat. Patient not taking: Reported on 02/13/2016 01/12/16   Eppie Gibson, MD     Vital Signs: BP (!) 148/93   Pulse 83   Temp 98.4 F (36.9 C) (Oral)   Resp 16   LMP 10/09/2011 (Approximate)   SpO2 97%   Physical Exam awake, alert. Chest clear to auscultation bilaterally. Clean,  intact left chest wall Port-A-Cath. Heart with regular rate and rhythm. Abdomen soft, positive bowel sounds, nontender. No lower extremity edema.  Imaging: No results found.  Labs:  CBC:  Recent Labs  12/15/15 0908 01/08/16 0905 05/13/16 0825 05/31/16 1023  WBC 2.6* 4.8 3.0* 3.2*  HGB 11.0* 10.7* 11.7 11.9*  HCT 33.6* 32.7* 35.5 35.5*  PLT 311 226 208 211    COAGS:  Recent Labs  10/09/15 0730 05/31/16 1023  INR 1.09 1.01  APTT 29  --     BMP:  Recent Labs  10/09/15 0730 10/09/15 0900  01/08/16 0905 01/20/16 0930 01/21/16 0857 05/13/16 0825  NA 139 140  < > 137 141 137 141  K 3.6 3.6  < > 3.9 4.1 4.1 3.9  CL 103 103  --   --   --   --   --   CO2 28 27  < > 27 29 28 24   GLUCOSE 102* 103*  < > 98 109 112 103  BUN 9 8  < > 12.3 22.4 24.0 15.3  CALCIUM 9.3 9.2  < > 9.0 9.4 9.5 9.1  CREATININE 0.73 0.76  < > 0.7 0.8 0.9 0.7  GFRNONAA >60 >60  --   --   --   --   --   GFRAA >60 >60  --   --   --   --   --   < > = values in this interval not displayed.  LIVER FUNCTION TESTS:  Recent Labs  11/24/15 0748 12/15/15 0908 01/08/16 0905 05/13/16 0825  BILITOT 0.30 0.25 0.47 0.48  AST 24 21 19 15   ALT 28 25 22 16   ALKPHOS 70 67 70 53  PROT 6.9 6.8 7.0 6.9  ALBUMIN 3.3* 3.3* 3.1* 3.4*    Assessment and Plan: Patient with history of large B-cell lymphoma, status post completion of chemoradiation. Presents today for Port-A-Cath removal. Details/risks of procedure, including but not limited to, internal bleeding, infection, injury to adjacent structures, discussed with patient/spouse with their understanding and consent.   Electronically Signed: D. Rowe Robert 05/31/2016, 10:52 AM   I spent a total of 15 minutes at the the patient's bedside AND on the patient's hospital floor or unit, greater than 50% of which was counseling/coordinating care for Port-A-Cath removal

## 2016-05-31 NOTE — Procedures (Signed)
LEFT IJ Port Catheter removal No complication No blood loss. See complete dictation in [facility].

## 2016-05-31 NOTE — Discharge Instructions (Signed)
Implanted Port Removal, Care After °Refer to this sheet in the next few weeks. These instructions provide you with information about caring for yourself after your procedure. Your health care provider may also give you more specific instructions. Your treatment has been planned according to current medical practices, but problems sometimes occur. Call your health care provider if you have any problems or questions after your procedure. °What can I expect after the procedure? °After the procedure, it is common to have: °· Soreness or pain near your incision. °· Some swelling or bruising near your incision. °Follow these instructions at home: °Medicines  °· Take over-the-counter and prescription medicines only as told by your health care provider. °· If you were prescribed an antibiotic medicine, take it as told by your health care provider. Do not stop taking the antibiotic even if you start to feel better. °Bathing  °· Do not take baths, swim, or use a hot tub until your health care provider approves. Ask your health care provider if you can take showers. You may only be allowed to take sponge baths for bathing. °Incision care  °· Follow instructions from your health care provider about how to take care of your incision. Make sure you: °¨ Wash your hands with soap and water before you change your bandage (dressing). If soap and water are not available, use hand sanitizer. °¨ Change your dressing as told by your health care provider. °¨ Keep your dressing dry. °¨ Leave stitches (sutures), skin glue, or adhesive strips in place. These skin closures may need to stay in place for 2 weeks or longer. If adhesive strip edges start to loosen and curl up, you may trim the loose edges. Do not remove adhesive strips completely unless your health care provider tells you to do that. °· Check your incision area every day for signs of infection. Check for: °¨ More redness, swelling, or pain. °¨ More fluid or  blood. °¨ Warmth. °¨ Pus or a bad smell. °Driving  °· If you received a sedative, do not drive for 24 hours after the procedure. °· If you did not receive a sedative, ask your health care provider when it is safe to drive. °Activity  °· Return to your normal activities as told by your health care provider. Ask your health care provider what activities are safe for you. °· Until your health care provider says it is safe: °¨ Do not lift anything that is heavier than 10 lb (4.5 kg). °¨ Do not do activities that involve lifting your arms over your head. °General instructions  °· Do not use any tobacco products, such as cigarettes, chewing tobacco, and e-cigarettes. Tobacco can delay healing. If you need help quitting, ask your health care provider. °· Keep all follow-up visits as told by your health care provider. This is important. °Contact a health care provider if: °· You have more redness, swelling, or pain around your incision. °· You have more fluid or blood coming from your incision. °· Your incision feels warm to the touch. °· You have pus or a bad smell coming from your incision. °· You have a fever. °· You have pain that is not relieved by your pain medicine. °Get help right away if: °· You have chest pain. °· You have difficulty breathing. °This information is not intended to replace advice given to you by your health care provider. Make sure you discuss any questions you have with your health care provider. °Document Released: 12/23/2014 Document Revised: 06/19/2015 Document   Reviewed: 10/16/2014 Elsevier Interactive Patient Education  2017 [place]. Moderate Conscious Sedation, Adult, Care After These instructions provide you with information about caring for yourself after your procedure. Your health care provider may also give you more specific instructions. Your treatment has been planned according to current medical practices, but problems sometimes occur. Call your health care provider if you  have any problems or questions after your procedure. What can I expect after the procedure? After your procedure, it is common:  To feel sleepy for several hours.  To feel clumsy and have poor balance for several hours.  To have poor judgment for several hours.  To vomit if you eat too soon. Follow these instructions at home: For at least 24 hours after the procedure:    Do not:  Participate in activities where you could fall or become injured.  Drive.  Use heavy machinery.  Drink alcohol.  Take sleeping pills or medicines that cause drowsiness.  Make important decisions or sign legal documents.  Take care of children on your own.  Rest. Eating and drinking   Follow the diet recommended by your health care provider.  If you vomit:  Drink water, juice, or soup when you can drink without vomiting.  Make sure you have little or no nausea before eating solid foods. General instructions   Have a responsible adult stay with you until you are awake and alert.  Take over-the-counter and prescription medicines only as told by your health care provider.  If you smoke, do not smoke without supervision.  Keep all follow-up visits as told by your health care provider. This is important. Contact a health care provider if:  You keep feeling nauseous or you keep vomiting.  You feel light-headed.  You develop a rash.  You have a fever. Get help right away if:  You have trouble breathing. This information is not intended to replace advice given to you by your health care provider. Make sure you discuss any questions you have with your health care provider. Document Released: 11/01/2012 Document Revised: 06/16/2015 Document Reviewed: 05/03/2015 Elsevier Interactive Patient Education  2017 [employer].

## 2016-06-09 ENCOUNTER — Other Ambulatory Visit: Payer: Self-pay | Admitting: Internal Medicine

## 2016-06-09 DIAGNOSIS — Z1231 Encounter for screening mammogram for malignant neoplasm of breast: Secondary | ICD-10-CM

## 2016-06-14 ENCOUNTER — Telehealth: Payer: Self-pay

## 2016-06-14 MED ORDER — MAGIC MOUTHWASH: 5.0000 mL | Freq: Four times a day (QID) | ORAL | 0 refills | Status: DC | PRN

## 2016-06-14 NOTE — Addendum Note (Signed)
Addended by: Janace Hoard on: 06/14/2016 03:00 PM   Modules accepted: Orders

## 2016-06-14 NOTE — Telephone Encounter (Signed)
Pt called c/o sores in her mouth.  Her mouth "hurts like crazy". It is one area under her tongue left side in the back. Since Thursday.  Is using peridex since Saturday. No change with using the peridex. Tried canker sore liquid with no help.  No fever, no diarrhea, no resp issues. Not in back of throat.   No idea why it showed up, no spicey food or anything.  Dr Osborne Casco is her PCP.  Is OK to leave a voice message.  Pharmacy is [facility] [address]

## 2016-06-14 NOTE — Telephone Encounter (Signed)
You may call in magic mouth wash with lidocaine 1:1:1:1, no refills If not better, I recommend dentist appt

## 2016-06-14 NOTE — Telephone Encounter (Signed)
lvm per Dr Alvy Bimler attached message. Called rx into [facility]

## 2016-06-25 ENCOUNTER — Ambulatory Visit
Admission: RE | Admit: 2016-06-25 | Discharge: 2016-06-25 | Disposition: A | Discharge status: Home / Self Care | Payer: 59 | Source: Ambulatory Visit | Attending: Internal Medicine | Admitting: Internal Medicine

## 2016-06-25 DIAGNOSIS — Z1231 Encounter for screening mammogram for malignant neoplasm of breast: Secondary | ICD-10-CM

## 2016-08-13 ENCOUNTER — Ambulatory Visit (HOSPITAL_BASED_OUTPATIENT_CLINIC_OR_DEPARTMENT_OTHER): Payer: 59 | Admitting: Hematology and Oncology

## 2016-08-13 ENCOUNTER — Other Ambulatory Visit (HOSPITAL_BASED_OUTPATIENT_CLINIC_OR_DEPARTMENT_OTHER): Payer: 59

## 2016-08-13 ENCOUNTER — Telehealth: Payer: Self-pay | Admitting: Hematology and Oncology

## 2016-08-13 VITALS — BP 132/61 | HR 69 | Temp 97.7°F | Resp 18 | Ht 64.0 in | Wt 198.4 lb

## 2016-08-13 DIAGNOSIS — Y842 Radiological procedure and radiotherapy as the cause of abnormal reaction of the patient, or of later complication, without mention of misadventure at the time of the procedure: Secondary | ICD-10-CM

## 2016-08-13 DIAGNOSIS — D72818 Other decreased white blood cell count: Secondary | ICD-10-CM

## 2016-08-13 DIAGNOSIS — C8331 Diffuse large B-cell lymphoma, lymph nodes of head, face, and neck: Secondary | ICD-10-CM | POA: Diagnosis not present

## 2016-08-13 DIAGNOSIS — R7989 Other specified abnormal findings of blood chemistry: Secondary | ICD-10-CM

## 2016-08-13 DIAGNOSIS — Z923 Personal history of irradiation: Secondary | ICD-10-CM

## 2016-08-13 DIAGNOSIS — K117 Disturbances of salivary secretion: Secondary | ICD-10-CM

## 2016-08-13 LAB — COMPREHENSIVE METABOLIC PANEL
ALT: 19 U/L (ref 0–55)
ANION GAP: 7 meq/L (ref 3–11)
AST: 18 U/L (ref 5–34)
Albumin: 3.4 g/dL — ABNORMAL LOW (ref 3.5–5.0)
Alkaline Phosphatase: 69 U/L (ref 40–150)
BILIRUBIN TOTAL: 0.41 mg/dL (ref 0.20–1.20)
BUN: 14.9 mg/dL (ref 7.0–26.0)
CALCIUM: 9.2 mg/dL (ref 8.4–10.4)
CHLORIDE: 104 meq/L (ref 98–109)
CO2: 27 mEq/L (ref 22–29)
CREATININE: 0.8 mg/dL (ref 0.6–1.1)
EGFR: >90 mL/min/{1.73_m2} (ref >90)
Glucose: 97 mg/dl (ref 70–140)
Potassium: 4.1 mEq/L (ref 3.5–5.1)
Sodium: 138 mEq/L (ref 136–145)
Total Protein: 6.9 g/dL (ref 6.4–8.3)

## 2016-08-13 LAB — CBC WITH DIFFERENTIAL/PLATELET
BASO%: 0.2 % (ref 0.0–2.0)
Basophils Absolute: 0 10*3/uL (ref 0.0–0.1)
EOS%: 2.6 % (ref 0.0–7.0)
Eosinophils Absolute: 0.1 10*3/uL (ref 0.0–0.5)
HEMATOCRIT: 35.9 % (ref 34.8–46.6)
HGB: 11.9 g/dL (ref 11.6–15.9)
LYMPH#: 1.2 10*3/uL (ref 0.9–3.3)
LYMPH%: 29.6 % (ref 14.0–49.7)
MCH: 29.3 pg (ref 25.1–34.0)
MCHC: 33 g/dL (ref 31.5–36.0)
MCV: 88.9 fL (ref 79.5–101.0)
MONO#: 0.5 10*3/uL (ref 0.1–0.9)
MONO%: 11.2 % (ref 0.0–14.0)
NEUT#: 2.3 10*3/uL (ref 1.5–6.5)
NEUT%: 56.4 % (ref 38.4–76.8)
PLATELETS: 221 10*3/uL (ref 145–400)
RBC: 4.05 10*6/uL (ref 3.70–5.45)
RDW: 13.5 % (ref 11.2–14.5)
WBC: 4 10*3/uL (ref 3.9–10.3)

## 2016-08-13 NOTE — Telephone Encounter (Signed)
Scheduled appt per 7/20 los - Gave patient AVS and calender per los.  

## 2016-08-15 ENCOUNTER — Encounter: Payer: Self-pay | Admitting: Hematology and Oncology

## 2016-08-15 DIAGNOSIS — Y842 Radiological procedure and radiotherapy as the cause of abnormal reaction of the patient, or of later complication, without mention of misadventure at the time of the procedure: Secondary | ICD-10-CM | POA: Insufficient documentation

## 2016-08-15 DIAGNOSIS — K117 Disturbances of salivary secretion: Secondary | ICD-10-CM | POA: Insufficient documentation

## 2016-08-15 NOTE — Assessment & Plan Note (Addendum)
The patient has no clinical signs or symptoms to suggest disease recurrence I recommend follow-up without surveillance imaging Due to prior exposure to radiation, I will check TSH in her next visit.

## 2016-08-15 NOTE — Progress Notes (Signed)
[place] OFFICE PROGRESS NOTE  Patient Care Team: Tisovec, Fransico Him, MD as PCP - General (Internal Medicine) Heath Lark, MD as Consulting Physician (Hematology and Oncology) Jodi Marble, MD as Consulting Physician (Otolaryngology) Eppie Gibson, MD as Attending Physician (Radiation Oncology) Leota Sauers, RN as Oncology Nurse Navigator (Oncology) Karie Mainland, RD as Dietitian (Nutrition)  SUMMARY OF ONCOLOGIC HISTORY:   Diffuse large B-cell lymphoma of lymph nodes of neck ([facility])   09/24/2015 Pathology Results    Final Cytologic Interpretation [id number]: Right level II neck mass, Fine Needle Aspiration I (smears and Thinprep): Malignant cells present, most consistent with large cell lymphoma.       09/24/2015 Pathology Results    [phone] The nasopharyngeal biopsy shows lymphoid tissue with diffuse effacement of the normal nodal architecture by large cells with vesicular nuclei, prominent nucleoli, and scant cytoplasm. The large cells are positive for CD20, BCL-6, BCL-2, CD5, and MUM1 by immunohistochemistry. They are negative for CD30, CD3, CD10, cyclinD1 and SOX11. In situ hybridization for EBV mRNA is negative. Ki-67 reveals a proliferation rate of approximately 60%. By flow cytometry, the atypical cells express CD19, CD5, and CD23 (see below). The expression of CD5 and CD23 by flow cytometry is suggestive of small lymphocytic lymphoma/ chronic lymphocytic leukemia (SLL/CLL) transforming to a diffuse large B-cell lymphoma (DLBCL); however, there was no evidence of SLL/CLL in the flow sample. Correlation with peripheral blood/ marrow findings would be useful to determine if this lymphoma arose from background SLL/CLL or is a de novo CD5+ DLBCL. De novo CD5+ DLBCL is frequently associated with an aggressive clinical course and poor response to chemotherapy. A pleomorphic mantle cell lymphoma is less likely since the cells are negative for Cyclin D1 and SOX11. FISH  studies are pending and will be reported as an addendum.       09/30/2015 Imaging    CT scan of neck showed bulky RIGHT neck lymphadenopathy concerning for metastatic disease. 8 mm submucosal RIGHT base of tongue mass, considering ipsilateral lymphadenopathy, this is suspicious for primary head and neck cancer. Isodense LEFT palatine tonsillar mass versus focal lymphoid hyperplasia resulting in LEFT postobstructive middle ear/mastoid effusion.       10/03/2015 Imaging    ECHO showed normal EF      10/08/2015 PET scan    Hypermetabolic left nasopharyngeal and tongue base masses with hypermetabolic bilateral neck adenopathy. Right level 2 adenopathy extends inferiorly into the low right internal jugular station. No additional evidence of metastatic disease in the chest, abdomen or pelvis.      10/09/2015 Procedure    Successful placement of a left internal jugular approach power injectable Port-A-Cath. The catheter is ready for immediate use.      10/13/2015 - 11/24/2015 Chemotherapy    She received 3 cycles of chemotherapy with R-CHOP      12/15/2015 PET scan    Interval complete resolution of hypermetabolic activity associated with the left tonsillar mass and multiple cervical lymph nodes consistent with complete remission. No residual hypermetabolic activity demonstrated.      01/01/2016 - 01/27/2016 Radiation Therapy    Received IMRT to oropharynx / 30.6 Gy in 17 fractions to involved sites of Head and Neck.        05/13/2016 PET scan    1. No recurrent abnormal hypermetabolic activity or adenopathy to suggest recurrent active lymphoma. 2. 2 by 3 mm left lower lobe pulmonary nodule, no change from earliest available comparison of 10/25/2015, not visibly hypermetabolic but below sensitive  PET-CT size thresholds, very likely to be benign but merits surveillance on any follow up. 3. Tiny focus of hypermetabolic activity along the cutaneous surface of the right perineum without CT  correlate, likely from contamination from minimal urinary incontinence. 4. Aortic Atherosclerosis (ICD10-I70.0).      05/31/2016 Procedure    Technically successful tunneled Port catheter removal.       INTERVAL HISTORY: Please see below for problem oriented charting. She returns for further follow-up She feels well Denies new lymphadenopathy No recent infection She complain of mild dry mouth  REVIEW OF SYSTEMS:   Constitutional: Denies fevers, chills or abnormal weight loss Eyes: Denies blurriness of vision Ears, nose, mouth, throat, and face: Denies mucositis or sore throat Respiratory: Denies cough, dyspnea or wheezes Cardiovascular: Denies palpitation, chest discomfort or lower extremity swelling Gastrointestinal:  Denies nausea, heartburn or change in bowel habits Skin: Denies abnormal skin rashes Lymphatics: Denies new lymphadenopathy or easy bruising Neurological:Denies numbness, tingling or new weaknesses Behavioral/Psych: Mood is stable, no new changes  All other systems were reviewed with the patient and are negative.  I have reviewed the past medical history, past surgical history, social history and family history with the patient and they are unchanged from previous note.  ALLERGIES:  is allergic to latex; penicillins; and sulfa antibiotics.  MEDICATIONS:  Current Outpatient Prescriptions  Medication Sig Dispense Refill  . acetaminophen (TYLENOL) 325 MG tablet Take 650 mg by mouth every 6 (six) hours as needed.    . Ascorbic Acid (VITAMIN C PO) Take 1 tablet by mouth daily.    . Cholecalciferol (VITAMIN D PO) Take 1 tablet by mouth daily.    . Multiple Vitamins-Minerals (MULTIVITAMIN WITH MINERALS) tablet Take 1 tablet by mouth daily.    . naproxen sodium (ANAPROX) 220 MG tablet Take 220 mg by mouth 2 (two) times daily as needed (for pain).     No current facility-administered medications for this visit.     PHYSICAL EXAMINATION: ECOG PERFORMANCE STATUS: 0  - Asymptomatic  Vitals:   08/13/16 1016  BP: 132/61  Pulse: 69  Resp: 18  Temp: 97.7 F (36.5 C)   Filed Weights   08/13/16 1016  Weight: 198 lb 6.4 oz (90 kg)    GENERAL:alert, no distress and comfortable SKIN: skin color, texture, turgor are normal, no rashes or significant lesions EYES: normal, Conjunctiva are pink and non-injected, sclera clear OROPHARYNX:no exudate, no erythema and lips, buccal mucosa, and tongue normal  NECK: supple, thyroid normal size, non-tender, without nodularity LYMPH:  no palpable lymphadenopathy in the cervical, axillary or inguinal LUNGS: clear to auscultation and percussion with normal breathing effort HEART: regular rate & rhythm and no murmurs and no lower extremity edema ABDOMEN:abdomen soft, non-tender and normal bowel sounds Musculoskeletal:no cyanosis of digits and no clubbing  NEURO: alert & oriented x 3 with fluent speech, no focal motor/sensory deficits  LABORATORY DATA:  I have reviewed the data as listed    Component Value Date/Time   NA 138 08/13/2016 0955   K 4.1 08/13/2016 0955   CL 103 10/09/2015 0900   CO2 27 08/13/2016 0955   GLUCOSE 97 08/13/2016 0955   BUN 14.9 08/13/2016 0955   CREATININE 0.8 08/13/2016 0955   CALCIUM 9.2 08/13/2016 0955   PROT 6.9 08/13/2016 0955   ALBUMIN 3.4 (L) 08/13/2016 0955   AST 18 08/13/2016 0955   ALT 19 08/13/2016 0955   ALKPHOS 69 08/13/2016 0955   BILITOT 0.41 08/13/2016 0955   GFRNONAA >60 10/09/2015  0900   GFRAA >60 10/09/2015 0900    No results found for: SPEP, UPEP  Lab Results  Component Value Date   WBC 4.0 08/13/2016   NEUTROABS 2.3 08/13/2016   HGB 11.9 08/13/2016   HCT 35.9 08/13/2016   MCV 88.9 08/13/2016   PLT 221 08/13/2016      Chemistry      Component Value Date/Time   NA 138 08/13/2016 0955   K 4.1 08/13/2016 0955   CL 103 10/09/2015 0900   CO2 27 08/13/2016 0955   BUN 14.9 08/13/2016 0955   CREATININE 0.8 08/13/2016 0955      Component Value  Date/Time   CALCIUM 9.2 08/13/2016 0955   ALKPHOS 69 08/13/2016 0955   AST 18 08/13/2016 0955   ALT 19 08/13/2016 0955   BILITOT 0.41 08/13/2016 0955      ASSESSMENT & PLAN:  Diffuse large B-cell lymphoma of lymph nodes of neck ([facility]) The patient has no clinical signs or symptoms to suggest disease recurrence I recommend follow-up without surveillance imaging Due to prior exposure to radiation, I will check TSH in her next visit.  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 TSH    Standing Status:   Future    Standing Expiration Date:   09/17/2017   All questions were answered. The patient knows to call the clinic with any problems, questions or concerns. No barriers to learning was detected. I spent 10 minutes counseling the patient face to face. The total time spent in the appointment was 15 minutes and more than 50% was on counseling and review of test results     Heath Lark, MD 08/15/2016 1:19 PM

## 2016-08-15 NOTE — Assessment & Plan Note (Signed)
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 

## 2016-11-25 IMAGING — CT NM PET TUM IMG RESTAG (PS) SKULL BASE T - THIGH
6 series · 25 of 25 positions shown · non-contrast
Comparison: PET-CT 10/08/2015

CLINICAL DATA: Subsequent treatment strategy for lymphoma. Large
B-cell lymphoma post chemotherapy.

EXAM:
NUCLEAR MEDICINE PET SKULL BASE TO THIGH
TECHNIQUE: 9.2 mCi F-18 FDG was injected intravenously. Full-ring PET imaging
was performed from the skull base to thigh after the radiotracer. CT
data was obtained and used for attenuation correction and anatomic
localization.
FASTING BLOOD GLUCOSE:  Value: 117 mg/dl

[Series 3: pet sk_thigh ac · axial · 5.0mm · 4.07mm/px · z∈[-1218,-406]mm · 5 of 204 slices shown]
[im 1/204]
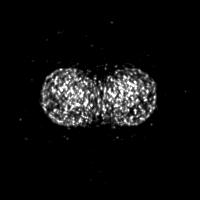
[im 51/204]
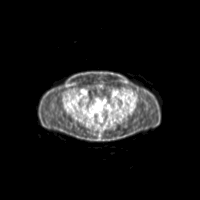
[im 102/204]
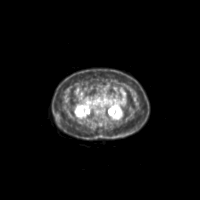
[im 153/204]
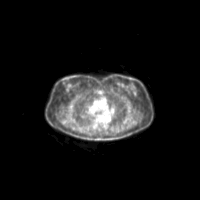
[im 204/204]
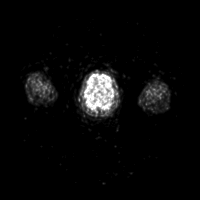

[Series 4: ct sk_thigh 5.0 hd_fov · axial · 5.0mm · 1.12mm/px · z∈[-1218,-406]mm · 6 of 203 slices shown]
[im 1/203]
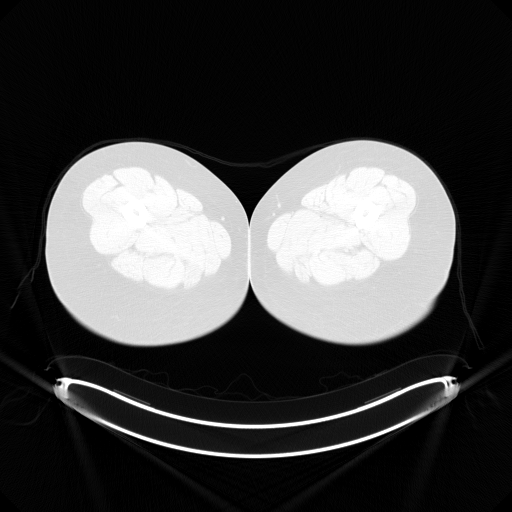
[im 41/203]
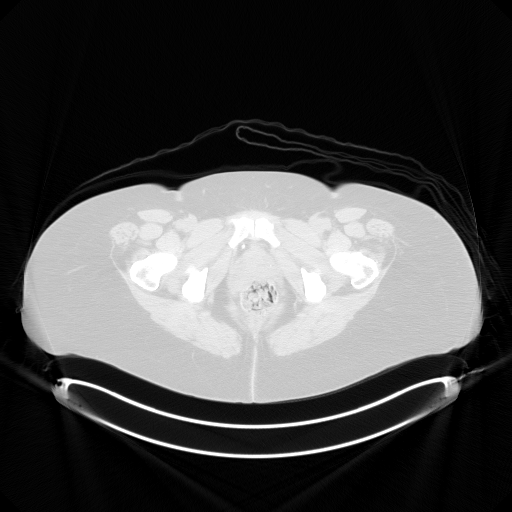
[im 81/203]
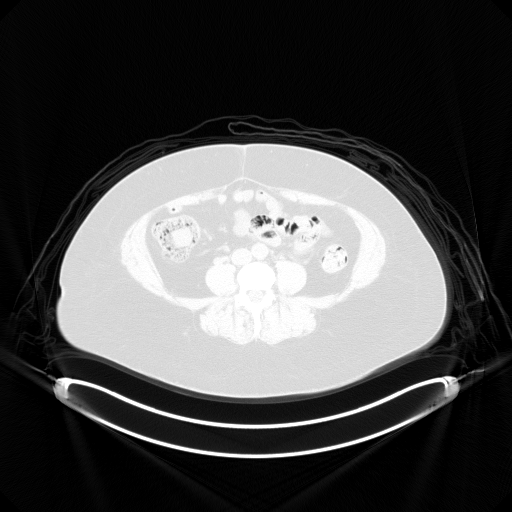
[im 122/203]
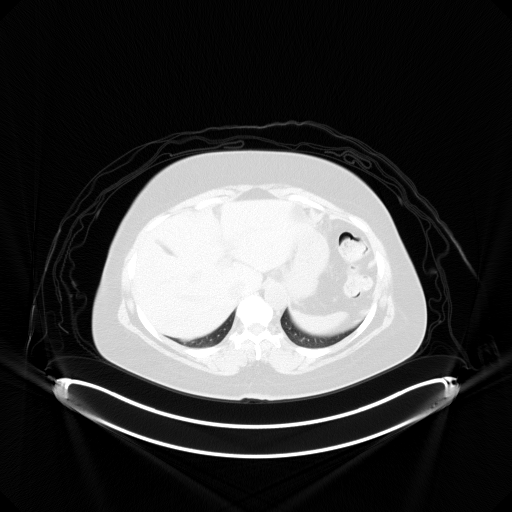
[im 162/203]
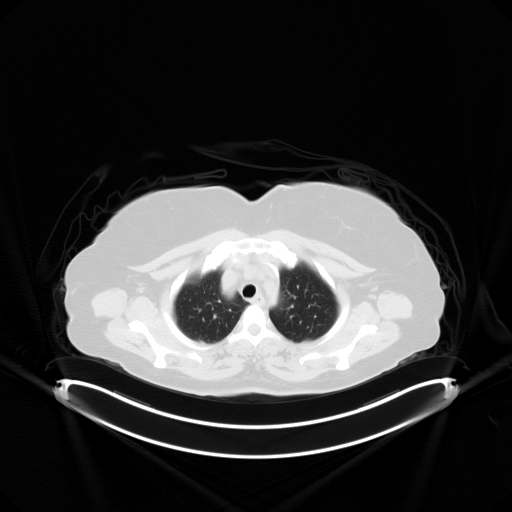
[im 203/203  brain]
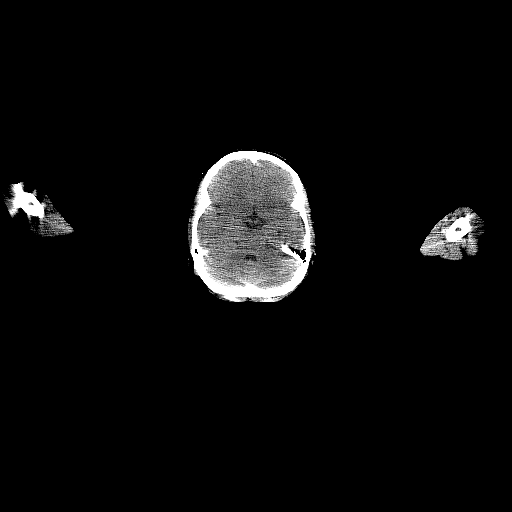

[Series 7: pet sk_thigh nac · axial · 5.0mm · 4.07mm/px · z∈[-1218,-406]mm · 6 of 204 slices shown]
[im 1/204]
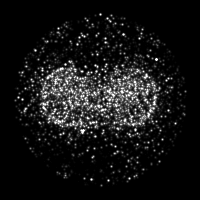
[im 41/204]
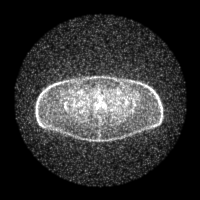
[im 82/204]
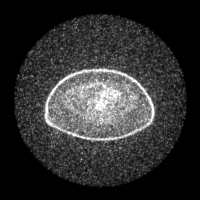
[im 122/204]
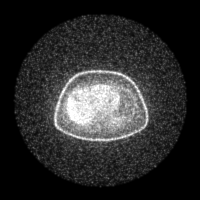
[im 163/204]
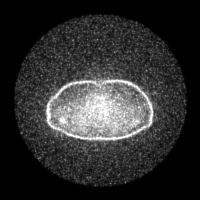
[im 204/204]
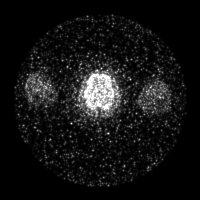

[Series 603: mip collection · coronal · 1.69mm/px · 1 of 32 slices shown]
[im 1/32]
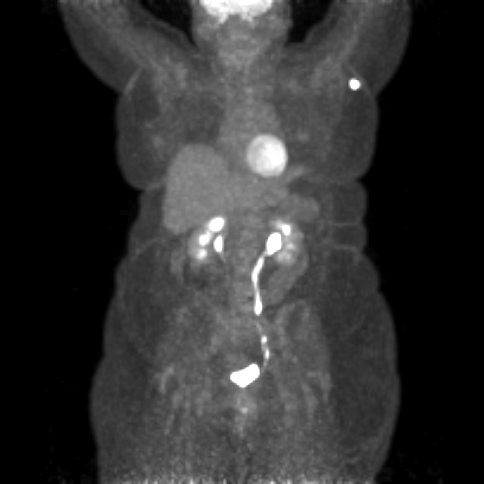

[Series 604: range-ct sk_thigh 5.0 hd_fov-cor-<alpha range> · 2 of 82 slices shown]
[im 1/82]
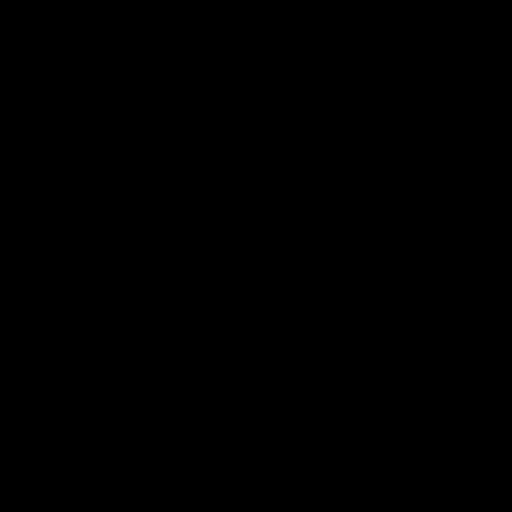
[im 82/82]
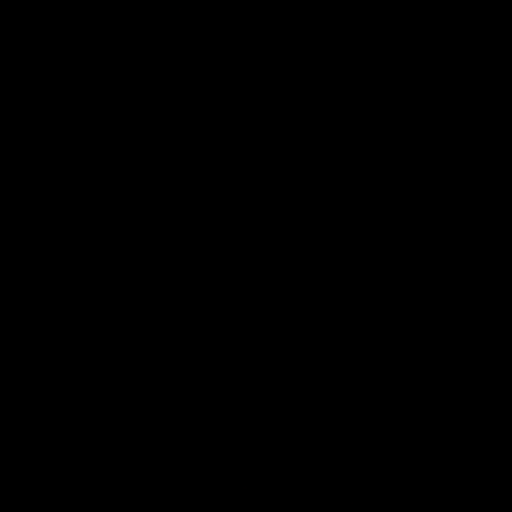

[Series 605: range-ct sk_thigh 5.0 hd_fov-tra-<alpha range> · 5 of 195 slices shown]
[im 1/195]
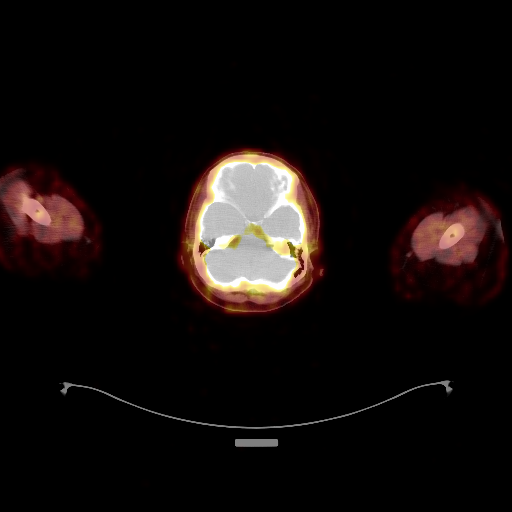
[im 49/195]
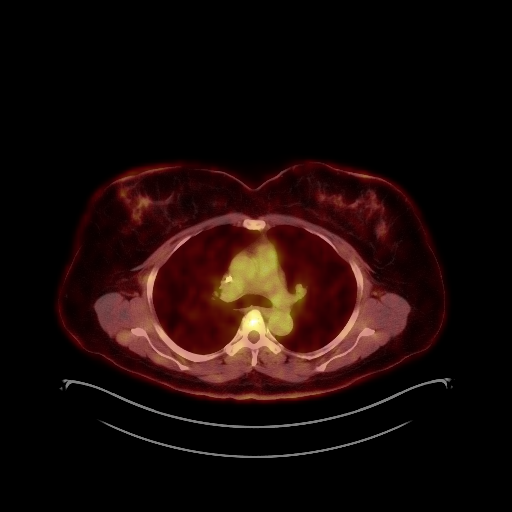
[im 98/195]
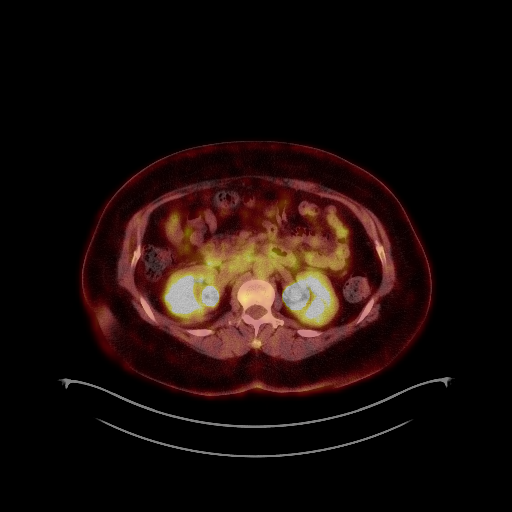
[im 146/195]
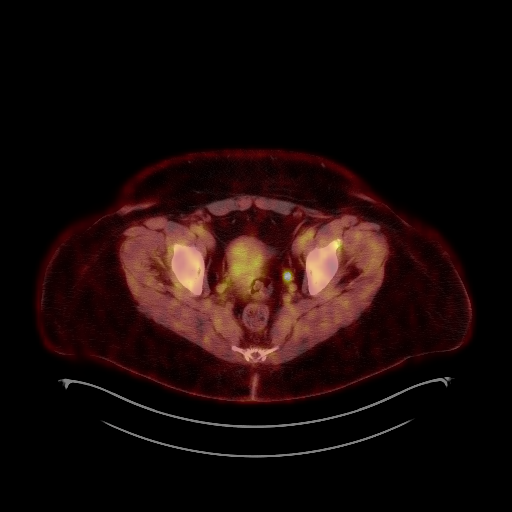
[im 195/195]
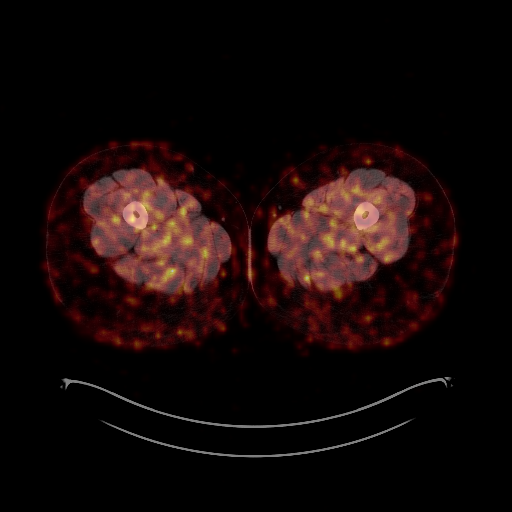

[25 of 25 positions shown; findings below may reference images not displayed]

FINDINGS: NECK

No residual hypermetabolic cervical lymph nodes are identified. The
previously demonstrated enlarged cervical lymph nodes bilaterally
are no longer seen. There is no residual hypermetabolic activity
within the nasopharyngeal space.The thyroid gland demonstrates no
abnormal activity.

CHEST

There are no hypermetabolic mediastinal, hilar or axillary lymph
nodes. There is no hypermetabolic pulmonary activity. 3 mm left
lower lobe pulmonary nodule on image 69 is stable. The lungs are
otherwise clear. Left IJ Port-A-Cath extends to the SVC right atrial
junction.

ABDOMEN/PELVIS

There is no hypermetabolic activity within the liver, adrenal
glands, spleen or pancreas. There is no hypermetabolic nodal
activity.

SKELETON

There is no hypermetabolic activity to suggest osseous metastatic
disease.
IMPRESSION: Interval complete resolution of hypermetabolic activity associated
with the left tonsillar mass and multiple cervical lymph nodes
consistent with complete remission. No residual hypermetabolic
activity demonstrated.

## 2016-12-24 ENCOUNTER — Telehealth: Payer: Self-pay | Admitting: Hematology and Oncology

## 2016-12-24 ENCOUNTER — Encounter: Payer: Self-pay | Admitting: Hematology and Oncology

## 2016-12-24 ENCOUNTER — Ambulatory Visit (HOSPITAL_BASED_OUTPATIENT_CLINIC_OR_DEPARTMENT_OTHER): Payer: 59 | Admitting: Hematology and Oncology

## 2016-12-24 ENCOUNTER — Telehealth: Payer: Self-pay

## 2016-12-24 ENCOUNTER — Other Ambulatory Visit (HOSPITAL_BASED_OUTPATIENT_CLINIC_OR_DEPARTMENT_OTHER): Payer: 59

## 2016-12-24 DIAGNOSIS — C8331 Diffuse large B-cell lymphoma, lymph nodes of head, face, and neck: Secondary | ICD-10-CM

## 2016-12-24 DIAGNOSIS — R634 Abnormal weight loss: Secondary | ICD-10-CM

## 2016-12-24 DIAGNOSIS — R7989 Other specified abnormal findings of blood chemistry: Secondary | ICD-10-CM

## 2016-12-24 DIAGNOSIS — R635 Abnormal weight gain: Secondary | ICD-10-CM | POA: Insufficient documentation

## 2016-12-24 LAB — TSH: TSH: 1.469 m(IU)/L (ref 0.308–3.960)

## 2016-12-24 NOTE — Progress Notes (Signed)
[facility] OFFICE PROGRESS NOTE  Patient Care Team: Tisovec, Fransico Him, MD as PCP - General (Internal Medicine) Heath Lark, MD as Consulting Physician (Hematology and Oncology) Jodi Marble, MD as Consulting Physician (Otolaryngology) Eppie Gibson, MD as Attending Physician (Radiation Oncology) Leota Sauers, RN as Oncology Nurse Navigator (Oncology) Karie Mainland, RD as Dietitian (Nutrition)  SUMMARY OF ONCOLOGIC HISTORY:   Diffuse large B-cell lymphoma of lymph nodes of neck ([place])   09/24/2015 Pathology Results    Final Cytologic Interpretation [id number]: Right level II neck mass, Fine Needle Aspiration I (smears and Thinprep): Malignant cells present, most consistent with large cell lymphoma.       09/24/2015 Pathology Results    [phone] The nasopharyngeal biopsy shows lymphoid tissue with diffuse effacement of the normal nodal architecture by large cells with vesicular nuclei, prominent nucleoli, and scant cytoplasm. The large cells are positive for CD20, BCL-6, BCL-2, CD5, and MUM1 by immunohistochemistry. They are negative for CD30, CD3, CD10, cyclinD1 and SOX11. In situ hybridization for EBV mRNA is negative. Ki-67 reveals a proliferation rate of approximately 60%. By flow cytometry, the atypical cells express CD19, CD5, and CD23 (see below). The expression of CD5 and CD23 by flow cytometry is suggestive of small lymphocytic lymphoma/ chronic lymphocytic leukemia (SLL/CLL) transforming to a diffuse large B-cell lymphoma (DLBCL); however, there was no evidence of SLL/CLL in the flow sample. Correlation with peripheral blood/ marrow findings would be useful to determine if this lymphoma arose from background SLL/CLL or is a de novo CD5+ DLBCL. De novo CD5+ DLBCL is frequently associated with an aggressive clinical course and poor response to chemotherapy. A pleomorphic mantle cell lymphoma is less likely since the cells are negative for Cyclin D1 and SOX11. FISH  studies are pending and will be reported as an addendum.       09/30/2015 Imaging    CT scan of neck showed bulky RIGHT neck lymphadenopathy concerning for metastatic disease. 8 mm submucosal RIGHT base of tongue mass, considering ipsilateral lymphadenopathy, this is suspicious for primary head and neck cancer. Isodense LEFT palatine tonsillar mass versus focal lymphoid hyperplasia resulting in LEFT postobstructive middle ear/mastoid effusion.       10/03/2015 Imaging    ECHO showed normal EF      10/08/2015 PET scan    Hypermetabolic left nasopharyngeal and tongue base masses with hypermetabolic bilateral neck adenopathy. Right level 2 adenopathy extends inferiorly into the low right internal jugular station. No additional evidence of metastatic disease in the chest, abdomen or pelvis.      10/09/2015 Procedure    Successful placement of a left internal jugular approach power injectable Port-A-Cath. The catheter is ready for immediate use.      10/13/2015 - 11/24/2015 Chemotherapy    She received 3 cycles of chemotherapy with R-CHOP      12/15/2015 PET scan    Interval complete resolution of hypermetabolic activity associated with the left tonsillar mass and multiple cervical lymph nodes consistent with complete remission. No residual hypermetabolic activity demonstrated.      01/01/2016 - 01/27/2016 Radiation Therapy    Received IMRT to oropharynx / 30.6 Gy in 17 fractions to involved sites of Head and Neck.        05/13/2016 PET scan    1. No recurrent abnormal hypermetabolic activity or adenopathy to suggest recurrent active lymphoma. 2. 2 by 3 mm left lower lobe pulmonary nodule, no change from earliest available comparison of 10/25/2015, not visibly hypermetabolic but below sensitive  PET-CT size thresholds, very likely to be benign but merits surveillance on any follow up. 3. Tiny focus of hypermetabolic activity along the cutaneous surface of the right perineum without CT  correlate, likely from contamination from minimal urinary incontinence. 4. Aortic Atherosclerosis (ICD10-I70.0).      05/31/2016 Procedure    Technically successful tunneled Port catheter removal.       INTERVAL HISTORY: Please see below for problem oriented charting. She returns for further follow-up She denies new lymphadenopathy Denies recent infection she has persistent dry mouth, Stable Denies difficulty swallowing  REVIEW OF SYSTEMS:   Constitutional: Denies fevers, chills or abnormal weight loss Eyes: Denies blurriness of vision Ears, nose, mouth, throat, and face: Denies mucositis or sore throat Respiratory: Denies cough, dyspnea or wheezes Cardiovascular: Denies palpitation, chest discomfort or lower extremity swelling Gastrointestinal:  Denies nausea, heartburn or change in bowel habits Skin: Denies abnormal skin rashes Lymphatics: Denies new lymphadenopathy or easy bruising Neurological:Denies numbness, tingling or new weaknesses Behavioral/Psych: Mood is stable, no new changes  All other systems were reviewed with the patient and are negative.  I have reviewed the past medical history, past surgical history, social history and family history with the patient and they are unchanged from previous note.  ALLERGIES:  is allergic to latex; penicillins; and sulfa antibiotics.  MEDICATIONS:  Current Outpatient Medications  Medication Sig Dispense Refill  . acetaminophen (TYLENOL) 325 MG tablet Take 650 mg by mouth every 6 (six) hours as needed.    . Ascorbic Acid (VITAMIN C PO) Take 1 tablet by mouth daily.    . Cholecalciferol (VITAMIN D PO) Take 1 tablet by mouth daily.    . Multiple Vitamins-Minerals (MULTIVITAMIN WITH MINERALS) tablet Take 1 tablet by mouth daily.    . naproxen sodium (ANAPROX) 220 MG tablet Take 220 mg by mouth 2 (two) times daily as needed (for pain).     No current facility-administered medications for this visit.     PHYSICAL  EXAMINATION: ECOG PERFORMANCE STATUS: 0 - Asymptomatic  Vitals:   12/24/16 0931  BP: 110/67  Pulse: 78  Resp: 18  Temp: 98.3 F (36.8 C)  SpO2: 100%   Filed Weights   12/24/16 0931  Weight: 213 lb 6.4 oz (96.8 kg)    GENERAL:alert, no distress and comfortable SKIN: skin color, texture, turgor are normal, no rashes or significant lesions EYES: normal, Conjunctiva are pink and non-injected, sclera clear OROPHARYNX:no exudate, no erythema and lips, buccal mucosa, and tongue normal  NECK: supple, thyroid normal size, non-tender, without nodularity LYMPH:  no palpable lymphadenopathy in the cervical, axillary or inguinal LUNGS: clear to auscultation and percussion with normal breathing effort HEART: regular rate & rhythm and no murmurs and no lower extremity edema ABDOMEN:abdomen soft, non-tender and normal bowel sounds Musculoskeletal:no cyanosis of digits and no clubbing  NEURO: alert & oriented x 3 with fluent speech, no focal motor/sensory deficits  LABORATORY DATA:  I have reviewed the data as listed    Component Value Date/Time   NA 138 08/13/2016 0955   K 4.1 08/13/2016 0955   CL 103 10/09/2015 0900   CO2 27 08/13/2016 0955   GLUCOSE 97 08/13/2016 0955   BUN 14.9 08/13/2016 0955   CREATININE 0.8 08/13/2016 0955   CALCIUM 9.2 08/13/2016 0955   PROT 6.9 08/13/2016 0955   ALBUMIN 3.4 (L) 08/13/2016 0955   AST 18 08/13/2016 0955   ALT 19 08/13/2016 0955   ALKPHOS 69 08/13/2016 0955   BILITOT 0.41 08/13/2016 0955  GFRNONAA >60 10/09/2015 0900   GFRAA >60 10/09/2015 0900    No results found for: SPEP, UPEP  Lab Results  Component Value Date   WBC 4.0 08/13/2016   NEUTROABS 2.3 08/13/2016   HGB 11.9 08/13/2016   HCT 35.9 08/13/2016   MCV 88.9 08/13/2016   PLT 221 08/13/2016      Chemistry      Component Value Date/Time   NA 138 08/13/2016 0955   K 4.1 08/13/2016 0955   CL 103 10/09/2015 0900   CO2 27 08/13/2016 0955   BUN 14.9 08/13/2016 0955    CREATININE 0.8 08/13/2016 0955      Component Value Date/Time   CALCIUM 9.2 08/13/2016 0955   ALKPHOS 69 08/13/2016 0955   AST 18 08/13/2016 0955   ALT 19 08/13/2016 0955   BILITOT 0.41 08/13/2016 0955     ASSESSMENT & PLAN:  Diffuse large B-cell lymphoma of lymph nodes of neck ([facility]) The patient has no clinical signs or symptoms to suggest disease recurrence I recommend follow-up without surveillance imaging  Weight gain She has progressive weight gain TSH level is normal I recommend dietary modification and exercise   No orders of the defined types were placed in this encounter.  All questions were answered. The patient knows to call the clinic with any problems, questions or concerns. No barriers to learning was detected. I spent 10 minutes counseling the patient face to face. The total time spent in the appointment was 15 minutes and more than 50% was on counseling and review of test results     Heath Lark, MD 12/24/2016 3:43 PM

## 2016-12-24 NOTE — Telephone Encounter (Signed)
Gave avs and calendar for March 2019 °

## 2016-12-24 NOTE — Telephone Encounter (Signed)
Called with below message. 

## 2016-12-24 NOTE — Assessment & Plan Note (Signed)
The patient has no clinical signs or symptoms to suggest disease recurrence I recommend follow-up without surveillance imaging  

## 2016-12-24 NOTE — Assessment & Plan Note (Signed)
She has progressive weight gain TSH level is normal I recommend dietary modification and exercise

## 2016-12-24 NOTE — Telephone Encounter (Signed)
-----   Message from Heath Lark, MD sent at 12/24/2016 11:39 AM EST ----- Regarding: Tell her TSH is normal   ----- Message ----- From: Interface, Lab In Three Zero One Sent: 12/24/2016  11:16 AM To: Heath Lark, MD

## 2017-04-21 ENCOUNTER — Other Ambulatory Visit: Payer: Self-pay | Admitting: Hematology and Oncology

## 2017-04-21 DIAGNOSIS — C8331 Diffuse large B-cell lymphoma, lymph nodes of head, face, and neck: Secondary | ICD-10-CM

## 2017-04-22 ENCOUNTER — Encounter: Payer: Self-pay | Admitting: Hematology and Oncology

## 2017-04-22 ENCOUNTER — Inpatient Hospital Stay: Payer: 59

## 2017-04-22 ENCOUNTER — Telehealth: Payer: Self-pay | Admitting: Hematology and Oncology

## 2017-04-22 ENCOUNTER — Inpatient Hospital Stay: Payer: 59 | Attending: Hematology and Oncology | Admitting: Hematology and Oncology

## 2017-04-22 DIAGNOSIS — Z9221 Personal history of antineoplastic chemotherapy: Secondary | ICD-10-CM | POA: Diagnosis not present

## 2017-04-22 DIAGNOSIS — E669 Obesity, unspecified: Secondary | ICD-10-CM

## 2017-04-22 DIAGNOSIS — C8331 Diffuse large B-cell lymphoma, lymph nodes of head, face, and neck: Secondary | ICD-10-CM

## 2017-04-22 DIAGNOSIS — Z923 Personal history of irradiation: Secondary | ICD-10-CM

## 2017-04-22 DIAGNOSIS — I700 Atherosclerosis of aorta: Secondary | ICD-10-CM

## 2017-04-22 DIAGNOSIS — R5383 Other fatigue: Secondary | ICD-10-CM | POA: Diagnosis not present

## 2017-04-22 DIAGNOSIS — K117 Disturbances of salivary secretion: Secondary | ICD-10-CM

## 2017-04-22 DIAGNOSIS — Z6835 Body mass index (BMI) 35.0-35.9, adult: Secondary | ICD-10-CM | POA: Diagnosis not present

## 2017-04-22 DIAGNOSIS — Z8572 Personal history of non-Hodgkin lymphomas: Secondary | ICD-10-CM | POA: Diagnosis not present

## 2017-04-22 LAB — COMPREHENSIVE METABOLIC PANEL
ALBUMIN: 3.6 g/dL (ref 3.5–5.0)
ALK PHOS: 67 U/L (ref 40–150)
ALT: 22 U/L (ref 0–55)
ANION GAP: 8 (ref 3–11)
AST: 17 U/L (ref 5–34)
BUN: 12 mg/dL (ref 7–26)
CALCIUM: 9.6 mg/dL (ref 8.4–10.4)
CO2: 27 mmol/L (ref 22–29)
Chloride: 104 mmol/L (ref 98–109)
Creatinine, Ser: 0.76 mg/dL (ref 0.60–1.10)
GFR calc Af Amer: >60 mL/min (ref >60)
GLUCOSE: 93 mg/dL (ref 70–140)
Potassium: 4.1 mmol/L (ref 3.5–5.1)
Sodium: 139 mmol/L (ref 136–145)
TOTAL PROTEIN: 7.4 g/dL (ref 6.4–8.3)
Total Bilirubin: 0.3 mg/dL (ref 0.2–1.2)

## 2017-04-22 LAB — CBC WITH DIFFERENTIAL/PLATELET
BASOS PCT: 0 %
Basophils Absolute: 0 10*3/uL (ref 0.0–0.1)
Eosinophils Absolute: 0.1 10*3/uL (ref 0.0–0.5)
Eosinophils Relative: 3 %
HCT: 38.8 % (ref 34.8–46.6)
HEMOGLOBIN: 12.6 g/dL (ref 11.6–15.9)
Lymphocytes Relative: 36 %
Lymphs Abs: 1.8 10*3/uL (ref 0.9–3.3)
MCH: 29.1 pg (ref 25.1–34.0)
MCHC: 32.5 g/dL (ref 31.5–36.0)
MCV: 89.6 fL (ref 79.5–101.0)
MONO ABS: 0.3 10*3/uL (ref 0.1–0.9)
MONOS PCT: 6 %
NEUTROS ABS: 2.7 10*3/uL (ref 1.5–6.5)
NEUTROS PCT: 55 %
Platelets: 223 10*3/uL (ref 145–400)
RBC: 4.33 MIL/uL (ref 3.70–5.45)
RDW: 13.5 % (ref 11.2–14.5)
WBC: 4.9 10*3/uL (ref 3.9–10.3)

## 2017-04-22 LAB — TSH: TSH: 1.252 u[IU]/mL (ref 0.308–3.960)

## 2017-04-22 NOTE — Progress Notes (Signed)
[facility] OFFICE PROGRESS NOTE  Patient Care Team: Tisovec, Fransico Him, MD as PCP - General (Internal Medicine) Heath Lark, MD as Consulting Physician (Hematology and Oncology) Jodi Marble, MD as Consulting Physician (Otolaryngology) Eppie Gibson, MD as Attending Physician (Radiation Oncology) Leota Sauers, RN as Oncology Nurse Navigator (Oncology) Karie Mainland, RD as Dietitian (Nutrition)  ASSESSMENT & PLAN:  Diffuse large B-cell lymphoma of lymph nodes of neck ([place]) The patient has no clinical signs or symptoms to suggest disease recurrence I recommend follow-up without surveillance imaging  Obesity (BMI 35.0-39.9 without comorbidity) She complained of excessive fatigue She has gained a lot of weight We discussed dietary modification and weight loss strategy  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 CBC with Differential/Platelet    Standing Status:   Future    Standing Expiration Date:   05/27/2018  . Comprehensive metabolic panel    Standing Status:   Future    Standing Expiration Date:   05/27/2018  . TSH    Standing Status:   Future    Standing Expiration Date:   05/27/2018    INTERVAL HISTORY: Please see below for problem oriented charting. She returns for further follow-up She denies new lymphadenopathy No recent infection She continues to have chronic dry mouth from radiation exposure She denies recent dental issue She complained of excessive fatigue Appetite is stable She has recent weight gain   SUMMARY OF ONCOLOGIC HISTORY:   Diffuse large  B-cell lymphoma of lymph nodes of neck ([place])   09/24/2015 Pathology Results    Final Cytologic Interpretation [id number]: Right level II neck mass, Fine Needle Aspiration I (smears and Thinprep): Malignant cells present, most consistent with large cell lymphoma.       09/24/2015 Pathology Results    [phone] The nasopharyngeal biopsy shows lymphoid tissue with diffuse effacement of the normal nodal architecture by large cells with vesicular nuclei, prominent nucleoli, and scant cytoplasm. The large cells are positive for CD20, BCL-6, BCL-2, CD5, and MUM1 by immunohistochemistry. They are negative for CD30, CD3, CD10, cyclinD1 and SOX11. In situ hybridization for EBV mRNA is negative. Ki-67 reveals a proliferation rate of approximately 60%. By flow cytometry, the atypical cells express CD19, CD5, and CD23 (see below). The expression of CD5 and CD23 by flow cytometry is suggestive of small lymphocytic lymphoma/ chronic lymphocytic leukemia (SLL/CLL) transforming to a diffuse large B-cell lymphoma (DLBCL); however, there was no evidence of SLL/CLL in the flow sample. Correlation with peripheral blood/ marrow findings would be useful to determine if this lymphoma arose from background SLL/CLL or is a de novo CD5+ DLBCL. De novo CD5+ DLBCL is frequently associated with an aggressive clinical course and poor response to chemotherapy. A pleomorphic mantle cell lymphoma is less likely since the cells are negative for Cyclin D1 and SOX11. FISH studies are pending and will be reported as an addendum.       09/30/2015 Imaging    CT scan of neck showed bulky RIGHT neck lymphadenopathy concerning for metastatic disease. 8 mm submucosal RIGHT base of tongue mass, considering ipsilateral lymphadenopathy, this is suspicious for primary head  and neck cancer. Isodense LEFT palatine tonsillar mass versus focal lymphoid hyperplasia resulting in LEFT postobstructive middle ear/mastoid effusion.       10/03/2015 Imaging     ECHO showed normal EF      10/08/2015 PET scan    Hypermetabolic left nasopharyngeal and tongue base masses with hypermetabolic bilateral neck adenopathy. Right level 2 adenopathy extends inferiorly into the low right internal jugular station. No additional evidence of metastatic disease in the chest, abdomen or pelvis.      10/09/2015 Procedure    Successful placement of a left internal jugular approach power injectable Port-A-Cath. The catheter is ready for immediate use.      10/13/2015 - 11/24/2015 Chemotherapy    She received 3 cycles of chemotherapy with R-CHOP      12/15/2015 PET scan    Interval complete resolution of hypermetabolic activity associated with the left tonsillar mass and multiple cervical lymph nodes consistent with complete remission. No residual hypermetabolic activity demonstrated.      01/01/2016 - 01/27/2016 Radiation Therapy    Received IMRT to oropharynx / 30.6 Gy in 17 fractions to involved sites of Head and Neck.        05/13/2016 PET scan    1. No recurrent abnormal hypermetabolic activity or adenopathy to suggest recurrent active lymphoma. 2. 2 by 3 mm left lower lobe pulmonary nodule, no change from earliest available comparison of 10/25/2015, not visibly hypermetabolic but below sensitive PET-CT size thresholds, very likely to be benign but merits surveillance on any follow up. 3. Tiny focus of hypermetabolic activity along the cutaneous surface of the right perineum without CT correlate, likely from contamination from minimal urinary incontinence. 4. Aortic Atherosclerosis (ICD10-I70.0).      05/31/2016 Procedure    Technically successful tunneled Port catheter removal.       REVIEW OF SYSTEMS:   Constitutional: Denies fevers, chills or abnormal weight loss Eyes: Denies blurriness of vision Ears, nose, mouth, throat, and face: Denies mucositis or sore throat Respiratory: Denies cough, dyspnea or wheezes Cardiovascular: Denies palpitation,  chest discomfort or lower extremity swelling Gastrointestinal:  Denies nausea, heartburn or change in bowel habits Skin: Denies abnormal skin rashes Lymphatics: Denies new lymphadenopathy or easy bruising Neurological:Denies numbness, tingling or new weaknesses Behavioral/Psych: Mood is stable, no new changes  All other systems were reviewed with the patient and are negative.  I have reviewed the past medical history, past surgical history, social history and family history with the patient and they are unchanged from previous note.  ALLERGIES:  is allergic to latex; penicillins; and sulfa antibiotics.  MEDICATIONS:  Current Outpatient Medications  Medication Sig Dispense Refill  . acetaminophen (TYLENOL) 325 MG tablet Take 650 mg by mouth every 6 (six) hours as needed.    . Ascorbic Acid (VITAMIN C PO) Take 1 tablet by mouth daily.    . Cholecalciferol (VITAMIN D PO) Take 1 tablet by mouth daily.    . Multiple Vitamins-Minerals (MULTIVITAMIN WITH MINERALS) tablet Take 1 tablet by mouth daily.    . naproxen sodium (ANAPROX) 220 MG tablet Take 220 mg by mouth 2 (two) times daily as needed (for pain).     No current facility-administered medications for this visit.     PHYSICAL EXAMINATION: ECOG PERFORMANCE STATUS: 1 - Symptomatic but completely ambulatory  Vitals:   04/22/17 1034  BP: 117/64  Pulse: 88  Resp: 18  Temp: 98.7 F (37.1 C)  SpO2: 98%   Filed Weights   04/22/17 1034  Weight: 216  lb 8 oz (98.2 kg)    GENERAL:alert, no distress and comfortable SKIN: skin color, texture, turgor are normal, no rashes or significant lesions EYES: normal, Conjunctiva are pink and non-injected, sclera clear OROPHARYNX:no exudate, no erythema and lips, buccal mucosa, and tongue normal  NECK: supple, thyroid normal size, non-tender, without nodularity LYMPH:  no palpable lymphadenopathy in the cervical, axillary or inguinal LUNGS: clear to auscultation and percussion with normal  breathing effort HEART: regular rate & rhythm and no murmurs and no lower extremity edema ABDOMEN:abdomen soft, non-tender and normal bowel sounds Musculoskeletal:no cyanosis of digits and no clubbing  NEURO: alert & oriented x 3 with fluent speech, no focal motor/sensory deficits  LABORATORY DATA:  I have reviewed the data as listed    Component Value Date/Time   NA 139 04/22/2017 0918   NA 138 08/13/2016 0955   K 4.1 04/22/2017 0918   K 4.1 08/13/2016 0955   CL 104 04/22/2017 0918   CO2 27 04/22/2017 0918   CO2 27 08/13/2016 0955   GLUCOSE 93 04/22/2017 0918   GLUCOSE 97 08/13/2016 0955   BUN 12 04/22/2017 0918   BUN 14.9 08/13/2016 0955   CREATININE 0.76 04/22/2017 0918   CREATININE 0.8 08/13/2016 0955   CALCIUM 9.6 04/22/2017 0918   CALCIUM 9.2 08/13/2016 0955   PROT 7.4 04/22/2017 0918   PROT 6.9 08/13/2016 0955   ALBUMIN 3.6 04/22/2017 0918   ALBUMIN 3.4 (L) 08/13/2016 0955   AST 17 04/22/2017 0918   AST 18 08/13/2016 0955   ALT 22 04/22/2017 0918   ALT 19 08/13/2016 0955   ALKPHOS 67 04/22/2017 0918   ALKPHOS 69 08/13/2016 0955   BILITOT 0.3 04/22/2017 0918   BILITOT 0.41 08/13/2016 0955   GFRNONAA >60 04/22/2017 0918   GFRAA >60 04/22/2017 0918    No results found for: SPEP, UPEP  Lab Results  Component Value Date   WBC 4.9 04/22/2017   NEUTROABS 2.7 04/22/2017   HGB 12.6 04/22/2017   HCT 38.8 04/22/2017   MCV 89.6 04/22/2017   PLT 223 04/22/2017      Chemistry      Component Value Date/Time   NA 139 04/22/2017 0918   NA 138 08/13/2016 0955   K 4.1 04/22/2017 0918   K 4.1 08/13/2016 0955   CL 104 04/22/2017 0918   CO2 27 04/22/2017 0918   CO2 27 08/13/2016 0955   BUN 12 04/22/2017 0918   BUN 14.9 08/13/2016 0955   CREATININE 0.76 04/22/2017 0918   CREATININE 0.8 08/13/2016 0955      Component Value Date/Time   CALCIUM 9.6 04/22/2017 0918   CALCIUM 9.2 08/13/2016 0955   ALKPHOS 67 04/22/2017 0918   ALKPHOS 69 08/13/2016 0955   AST 17  04/22/2017 0918   AST 18 08/13/2016 0955   ALT 22 04/22/2017 0918   ALT 19 08/13/2016 0955   BILITOT 0.3 04/22/2017 0918   BILITOT 0.41 08/13/2016 0955       All questions were answered. The patient knows to call the clinic with any problems, questions or concerns. No barriers to learning was detected.  I spent 15 minutes counseling the patient face to face. The total time spent in the appointment was 20 minutes and more than 50% was on counseling and review of test results  Heath Lark, MD 04/22/2017 12:47 PM

## 2017-04-22 NOTE — Telephone Encounter (Signed)
Gave avs and calendar ° °

## 2017-04-22 NOTE — Assessment & Plan Note (Signed)
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

## 2017-04-22 NOTE — Assessment & Plan Note (Signed)
She complained of excessive fatigue She has gained a lot of weight We discussed dietary modification and weight loss strategy

## 2017-04-22 NOTE — Assessment & Plan Note (Signed)
She complained of chronic fatigue TSH is within normal limits We discussed weight loss, dietary modification and exercise as tolerated Due to exposure to radiation treatment, I will continue to monitor TSH carefully

## 2017-04-22 NOTE — Telephone Encounter (Signed)
Patient requested wednesday

## 2017-04-22 NOTE — Assessment & Plan Note (Signed)
The patient has no clinical signs or symptoms to suggest disease recurrence I recommend follow-up without surveillance imaging  

## 2017-06-08 ENCOUNTER — Other Ambulatory Visit: Payer: Self-pay | Admitting: Internal Medicine

## 2017-06-08 DIAGNOSIS — Z1231 Encounter for screening mammogram for malignant neoplasm of breast: Secondary | ICD-10-CM

## 2017-07-06 ENCOUNTER — Ambulatory Visit
Admission: RE | Admit: 2017-07-06 | Discharge: 2017-07-06 | Disposition: A | Discharge status: Home / Self Care | Payer: POS | Source: Ambulatory Visit | Attending: Internal Medicine | Admitting: Internal Medicine

## 2017-07-06 DIAGNOSIS — Z1231 Encounter for screening mammogram for malignant neoplasm of breast: Secondary | ICD-10-CM

## 2017-10-19 ENCOUNTER — Telehealth: Payer: Self-pay | Admitting: Hematology and Oncology

## 2017-10-19 ENCOUNTER — Inpatient Hospital Stay: Payer: POS | Attending: Hematology and Oncology | Admitting: Hematology and Oncology

## 2017-10-19 ENCOUNTER — Inpatient Hospital Stay: Payer: POS

## 2017-10-19 ENCOUNTER — Telehealth: Payer: Self-pay

## 2017-10-19 ENCOUNTER — Encounter: Payer: Self-pay | Admitting: Hematology and Oncology

## 2017-10-19 DIAGNOSIS — Z9221 Personal history of antineoplastic chemotherapy: Secondary | ICD-10-CM | POA: Insufficient documentation

## 2017-10-19 DIAGNOSIS — C8331 Diffuse large B-cell lymphoma, lymph nodes of head, face, and neck: Secondary | ICD-10-CM

## 2017-10-19 DIAGNOSIS — Z8572 Personal history of non-Hodgkin lymphomas: Secondary | ICD-10-CM

## 2017-10-19 DIAGNOSIS — R5383 Other fatigue: Secondary | ICD-10-CM

## 2017-10-19 DIAGNOSIS — Z923 Personal history of irradiation: Secondary | ICD-10-CM | POA: Diagnosis not present

## 2017-10-19 DIAGNOSIS — Z79899 Other long term (current) drug therapy: Secondary | ICD-10-CM | POA: Insufficient documentation

## 2017-10-19 DIAGNOSIS — E669 Obesity, unspecified: Secondary | ICD-10-CM | POA: Diagnosis not present

## 2017-10-19 LAB — CBC WITH DIFFERENTIAL/PLATELET
Basophils Absolute: 0 10*3/uL (ref 0.0–0.1)
Basophils Relative: 0 %
EOS PCT: 3 %
Eosinophils Absolute: 0.1 10*3/uL (ref 0.0–0.5)
HCT: 36.9 % (ref 34.8–46.6)
Hemoglobin: 12 g/dL (ref 11.6–15.9)
LYMPHS ABS: 1.8 10*3/uL (ref 0.9–3.3)
LYMPHS PCT: 42 %
MCH: 28.9 pg (ref 25.1–34.0)
MCHC: 32.5 g/dL (ref 31.5–36.0)
MCV: 88.9 fL (ref 79.5–101.0)
MONOS PCT: 8 %
Monocytes Absolute: 0.4 10*3/uL (ref 0.1–0.9)
Neutro Abs: 2.1 10*3/uL (ref 1.5–6.5)
Neutrophils Relative %: 47 %
PLATELETS: 203 10*3/uL (ref 145–400)
RBC: 4.15 MIL/uL (ref 3.70–5.45)
RDW: 13.7 % (ref 11.2–14.5)
WBC: 4.4 10*3/uL (ref 3.9–10.3)

## 2017-10-19 LAB — COMPREHENSIVE METABOLIC PANEL
ALBUMIN: 3.4 g/dL — AB (ref 3.5–5.0)
ALT: 17 U/L (ref 0–44)
AST: 13 U/L — AB (ref 15–41)
Alkaline Phosphatase: 63 U/L (ref 38–126)
Anion gap: 8 (ref 5–15)
BUN: 16 mg/dL (ref 6–20)
CO2: 27 mmol/L (ref 22–32)
CREATININE: 0.73 mg/dL (ref 0.44–1.00)
Calcium: 8.9 mg/dL (ref 8.9–10.3)
Chloride: 104 mmol/L (ref 98–111)
GFR calc Af Amer: >60 mL/min (ref >60)
GFR calc non Af Amer: >60 mL/min (ref >60)
Glucose, Bld: 104 mg/dL — ABNORMAL HIGH (ref 70–99)
POTASSIUM: 4.2 mmol/L (ref 3.5–5.1)
Sodium: 139 mmol/L (ref 135–145)
Total Bilirubin: 0.3 mg/dL (ref 0.3–1.2)
Total Protein: 6.9 g/dL (ref 6.5–8.1)

## 2017-10-19 LAB — TSH: TSH: 1.403 u[IU]/mL (ref 0.308–3.960)

## 2017-10-19 NOTE — Progress Notes (Signed)
[facility] OFFICE PROGRESS NOTE  Patient Care Team: Tisovec, Fransico Him, MD as PCP - General (Internal Medicine) Heath Lark, MD as Consulting Physician (Hematology and Oncology) Jodi Marble, MD as Consulting Physician (Otolaryngology) Eppie Gibson, MD as Attending Physician (Radiation Oncology)  ASSESSMENT & PLAN:  Diffuse large B-cell lymphoma of lymph nodes of neck [facility]) The patient has no clinical signs or symptoms to suggest disease recurrence I recommend follow-up without surveillance imaging We discussed influenza vaccination.   Obesity (BMI 35.0-39.9 without comorbidity) We discussed dietary modification and weight loss strategy   No orders of the defined types were placed in this encounter.   INTERVAL HISTORY: Please see below for problem oriented charting. She returns for further follow-up Doing well No new lymphadenopathy Denies recent infection, fever or chills  SUMMARY OF ONCOLOGIC HISTORY:   Diffuse large B-cell lymphoma of lymph nodes of neck ([place])   09/24/2015 Pathology Results    Final Cytologic Interpretation [id number]: Right level II neck mass, Fine Needle Aspiration I (smears and Thinprep): Malignant cells present, most consistent with large cell lymphoma.     09/24/2015 Pathology Results    [phone] The nasopharyngeal biopsy shows lymphoid tissue with diffuse effacement of the normal nodal architecture by large cells with vesicular nuclei, prominent nucleoli, and scant cytoplasm. The large cells are positive for CD20, BCL-6, BCL-2, CD5, and MUM1 by immunohistochemistry. They are negative for CD30, CD3, CD10, cyclinD1 and SOX11. In situ hybridization for EBV mRNA is negative. Ki-67 reveals a proliferation rate of approximately 60%. By flow cytometry, the atypical cells express CD19, CD5, and CD23 (see below). The expression of CD5 and CD23 by flow cytometry is suggestive of small lymphocytic lymphoma/ chronic lymphocytic leukemia  (SLL/CLL) transforming to a diffuse large B-cell lymphoma (DLBCL); however, there was no evidence of SLL/CLL in the flow sample. Correlation with peripheral blood/ marrow findings would be useful to determine if this lymphoma arose from background SLL/CLL or is a de novo CD5+ DLBCL. De novo CD5+ DLBCL is frequently associated with an aggressive clinical course and poor response to chemotherapy. A pleomorphic mantle cell lymphoma is less likely since the cells are negative for Cyclin D1 and SOX11. FISH studies are pending and will be reported as an addendum.     09/30/2015 Imaging    CT scan of neck showed bulky RIGHT neck lymphadenopathy concerning for metastatic disease. 8 mm submucosal RIGHT base of tongue mass, considering ipsilateral lymphadenopathy, this is suspicious for primary head and neck cancer. Isodense LEFT palatine tonsillar mass versus focal lymphoid hyperplasia resulting in LEFT postobstructive middle ear/mastoid effusion.     10/03/2015 Imaging    ECHO showed normal EF    10/08/2015 PET scan    Hypermetabolic left nasopharyngeal and tongue base masses with hypermetabolic bilateral neck adenopathy. Right level 2 adenopathy extends inferiorly into the low right internal jugular station. No additional evidence of metastatic disease in the chest, abdomen or pelvis.    10/09/2015 Procedure    Successful placement of a left internal jugular approach power injectable Port-A-Cath. The catheter is ready for immediate use.    10/13/2015 - 11/24/2015 Chemotherapy    She received 3 cycles of chemotherapy with R-CHOP    12/15/2015 PET scan    Interval complete resolution of hypermetabolic activity associated with the left tonsillar mass and multiple cervical lymph nodes consistent with complete remission. No residual hypermetabolic activity demonstrated.    01/01/2016 - 01/27/2016 Radiation Therapy    Received IMRT to oropharynx / 30.6 Gy  in 17 fractions to involved sites of Head and Neck.       05/13/2016 PET scan    1. No recurrent abnormal hypermetabolic activity or adenopathy to suggest recurrent active lymphoma. 2. 2 by 3 mm left lower lobe pulmonary nodule, no change from earliest available comparison of 10/25/2015, not visibly hypermetabolic but below sensitive PET-CT size thresholds, very likely to be benign but merits surveillance on any follow up. 3. Tiny focus of hypermetabolic activity along the cutaneous surface of the right perineum without CT correlate, likely from contamination from minimal urinary incontinence. 4. Aortic Atherosclerosis (ICD10-I70.0).    05/31/2016 Procedure    Technically successful tunneled Port catheter removal.     REVIEW OF SYSTEMS:   Constitutional: Denies fevers, chills or abnormal weight loss Eyes: Denies blurriness of vision Ears, nose, mouth, throat, and face: Denies mucositis or sore throat Respiratory: Denies cough, dyspnea or wheezes Cardiovascular: Denies palpitation, chest discomfort or lower extremity swelling Gastrointestinal:  Denies nausea, heartburn or change in bowel habits Skin: Denies abnormal skin rashes Lymphatics: Denies new lymphadenopathy or easy bruising Neurological:Denies numbness, tingling or new weaknesses Behavioral/Psych: Mood is stable, no new changes  All other systems were reviewed with the patient and are negative.  I have reviewed the past medical history, past surgical history, social history and family history with the patient and they are unchanged from previous note.  ALLERGIES:  is allergic to latex; penicillins; and sulfa antibiotics.  MEDICATIONS:  Current Outpatient Medications  Medication Sig Dispense Refill  . acetaminophen (TYLENOL) 325 MG tablet Take 650 mg by mouth every 6 (six) hours as needed.    . Ascorbic Acid (VITAMIN C PO) Take 1 tablet by mouth daily.    . Cholecalciferol (VITAMIN D PO) Take 1 tablet by mouth daily.    . Multiple Vitamins-Minerals (MULTIVITAMIN WITH MINERALS)  tablet Take 1 tablet by mouth daily.    . naproxen sodium (ANAPROX) 220 MG tablet Take 220 mg by mouth 2 (two) times daily as needed (for pain).     No current facility-administered medications for this visit.     PHYSICAL EXAMINATION: ECOG PERFORMANCE STATUS: 0 - Asymptomatic  Vitals:   10/19/17 0825  BP: 125/79  Pulse: 65  Resp: 18  Temp: 98.2 F (36.8 C)  SpO2: 100%   Filed Weights   10/19/17 0825  Weight: 215 lb 12.8 oz (97.9 kg)    GENERAL:alert, no distress and comfortable SKIN: skin color, texture, turgor are normal, no rashes or significant lesions EYES: normal, Conjunctiva are pink and non-injected, sclera clear OROPHARYNX:no exudate, no erythema and lips, buccal mucosa, and tongue normal  NECK: supple, thyroid normal size, non-tender, without nodularity LYMPH:  no palpable lymphadenopathy in the cervical, axillary or inguinal LUNGS: clear to auscultation and percussion with normal breathing effort HEART: regular rate & rhythm and no murmurs and no lower extremity edema ABDOMEN:abdomen soft, non-tender and normal bowel sounds Musculoskeletal:no cyanosis of digits and no clubbing  NEURO: alert & oriented x 3 with fluent speech, no focal motor/sensory deficits  LABORATORY DATA:  I have reviewed the data as listed    Component Value Date/Time   NA 139 10/19/2017 0748   NA 138 08/13/2016 0955   K 4.2 10/19/2017 0748   K 4.1 08/13/2016 0955   CL 104 10/19/2017 0748   CO2 27 10/19/2017 0748   CO2 27 08/13/2016 0955   GLUCOSE 104 (H) 10/19/2017 0748   GLUCOSE 97 08/13/2016 0955   BUN 16 10/19/2017 0748  BUN 14.9 08/13/2016 0955   CREATININE 0.73 10/19/2017 0748   CREATININE 0.8 08/13/2016 0955   CALCIUM 8.9 10/19/2017 0748   CALCIUM 9.2 08/13/2016 0955   PROT 6.9 10/19/2017 0748   PROT 6.9 08/13/2016 0955   ALBUMIN 3.4 (L) 10/19/2017 0748   ALBUMIN 3.4 (L) 08/13/2016 0955   AST 13 (L) 10/19/2017 0748   AST 18 08/13/2016 0955   ALT 17 10/19/2017 0748    ALT 19 08/13/2016 0955   ALKPHOS 63 10/19/2017 0748   ALKPHOS 69 08/13/2016 0955   BILITOT 0.3 10/19/2017 0748   BILITOT 0.41 08/13/2016 0955   GFRNONAA >60 10/19/2017 0748   GFRAA >60 10/19/2017 0748    No results found for: SPEP, UPEP  Lab Results  Component Value Date   WBC 4.4 10/19/2017   NEUTROABS 2.1 10/19/2017   HGB 12.0 10/19/2017   HCT 36.9 10/19/2017   MCV 88.9 10/19/2017   PLT 203 10/19/2017      Chemistry      Component Value Date/Time   NA 139 10/19/2017 0748   NA 138 08/13/2016 0955   K 4.2 10/19/2017 0748   K 4.1 08/13/2016 0955   CL 104 10/19/2017 0748   CO2 27 10/19/2017 0748   CO2 27 08/13/2016 0955   BUN 16 10/19/2017 0748   BUN 14.9 08/13/2016 0955   CREATININE 0.73 10/19/2017 0748   CREATININE 0.8 08/13/2016 0955      Component Value Date/Time   CALCIUM 8.9 10/19/2017 0748   CALCIUM 9.2 08/13/2016 0955   ALKPHOS 63 10/19/2017 0748   ALKPHOS 69 08/13/2016 0955   AST 13 (L) 10/19/2017 0748   AST 18 08/13/2016 0955   ALT 17 10/19/2017 0748   ALT 19 08/13/2016 0955   BILITOT 0.3 10/19/2017 0748   BILITOT 0.41 08/13/2016 0955       All questions were answered. The patient knows to call the clinic with any problems, questions or concerns. No barriers to learning was detected.  I spent 10 minutes counseling the patient face to face. The total time spent in the appointment was 15 minutes and more than 50% was on counseling and review of test results  Heath Lark, MD 10/19/2017 3:08 PM

## 2017-10-19 NOTE — Assessment & Plan Note (Signed)
The patient has no clinical signs or symptoms to suggest disease recurrence I recommend follow-up without surveillance imaging We discussed influenza vaccination.

## 2017-10-19 NOTE — Telephone Encounter (Signed)
Per 9/25 los.  Called patient to schedule 6 month appt for 3/25 @ 8:30 am.

## 2017-10-19 NOTE — Telephone Encounter (Signed)
Called and given below message. She verbalized understanding. 

## 2017-10-19 NOTE — Telephone Encounter (Signed)
-----   Message from Heath Lark, MD sent at 10/19/2017  9:39 AM EDT ----- Regarding: other test results CMP and TSH are ok ----- Message ----- From: Interface, Lab In [place] Sent: 10/19/2017   8:05 AM EDT To: Heath Lark, MD

## 2017-10-19 NOTE — Assessment & Plan Note (Signed)
We discussed dietary modification and weight loss strategy 

## 2017-10-28 ENCOUNTER — Other Ambulatory Visit: Payer: Self-pay | Admitting: Radiation Oncology

## 2017-10-28 ENCOUNTER — Telehealth: Payer: Self-pay

## 2017-10-28 DIAGNOSIS — C8331 Diffuse large B-cell lymphoma, lymph nodes of head, face, and neck: Secondary | ICD-10-CM

## 2017-10-28 NOTE — Telephone Encounter (Signed)
Called and left a message. Dr. Isidore Moos made a referral for PT today. After PT assessment Dr. Alvy Bimler can try to assist with the note she needs for work. Instructed to call the office back.

## 2017-10-28 NOTE — Telephone Encounter (Signed)
I received a phone call from Ms. Crutcher reporting neck stiffness and pain to her neck with movement. She completed radiation 01/2016 but is currently being followed by Dr. Alvy Bimler in medical oncology long term. Ms. Varnum requested a letter from Dr. Isidore Moos stating that she is not able to work greater than 40 hours a week. After becoming aware of the request Dr. Isidore Moos asked me to contact Dr. Calton Dach office to consider the letter that Ms. Streed would like. I have called and left a message with Dr. Calton Dach nurse regarding the request. Dr. Isidore Moos did offer to refer Ms. Grisanti to PT which Ms. Wegman accepted. Ms. Docter knows how to contact me if she has any further questions or concerns.

## 2017-11-01 ENCOUNTER — Telehealth: Payer: Self-pay | Admitting: *Deleted

## 2017-11-01 NOTE — Telephone Encounter (Signed)
CALLED [facility] TO ARRANGE THIS APPT. FOR THIS PATIENT, PATIENT WANTS TO COME ON 11-02-17 , NO AVAILABILITY @ THIS TIME, SHE HAS BEEN PUT ON A WAITING LIST

## 2017-11-04 ENCOUNTER — Ambulatory Visit: Payer: POS | Attending: Orthopedic Surgery | Admitting: Physical Therapy

## 2017-11-04 ENCOUNTER — Other Ambulatory Visit: Payer: Self-pay

## 2017-11-04 ENCOUNTER — Encounter: Payer: Self-pay | Admitting: Physical Therapy

## 2017-11-04 DIAGNOSIS — M6281 Muscle weakness (generalized): Secondary | ICD-10-CM | POA: Diagnosis present

## 2017-11-04 DIAGNOSIS — R293 Abnormal posture: Secondary | ICD-10-CM | POA: Insufficient documentation

## 2017-11-04 DIAGNOSIS — M542 Cervicalgia: Secondary | ICD-10-CM | POA: Diagnosis not present

## 2017-11-04 NOTE — Patient Instructions (Signed)
Access Code: [email or web address]  URL: [email or web address]  Date: 11/04/2017  Prepared by: Manus Gunning   Exercises  Seated Cervical Rotation AROM - 10 reps - 1 sets - 2x daily - 7x weekly  Standing Cervical Sidebending AROM - 10 reps - 1 sets - 2x daily - 7x weekly  Seated Cervical Flexion AROM - 10 reps - 1 sets - 2x daily - 7x weekly  Seated Cervical Extension AROM - 10 reps - 1 sets - 2x daily - 7x weekly  Supine Chin Tuck - 10 reps - 1 sets - 5 hold - 2x daily - 7x weekly

## 2017-11-04 NOTE — Therapy (Signed)
[place], [place], [address] Phone: [phone]   Fax:  [phone]  Physical Therapy Evaluation  Patient Details  Name: Charlotte Davidson MRN: [id number] Date of Birth: 1960-12-25 Referring Provider (PT): Dr. Eppie Gibson   Encounter Date: 11/04/2017  PT End of Session - 11/04/17 0934    Visit Number  1    Number of Visits  9    Date for PT Re-Evaluation  12/02/17    PT Start Time  0846    PT Stop Time  0930    PT Time Calculation (min)  44 min    Activity Tolerance  Patient tolerated treatment well    Behavior During Therapy  [facility] for tasks assessed/performed       Past Medical History:  Diagnosis Date  . Dry mouth   . History of chemotherapy   . History of radiation therapy 01/01/2016- 01/27/2016   Oropharynx  . Lymphoma [facility])     Past Surgical History:  Procedure Laterality Date  . BREAST CYST ASPIRATION Right 01/24/2015  . BREAST SURGERY  01.17.2017   incise and drain right breast abscess  . CESAREAN SECTION  09.10.1996  . CHOLECYSTECTOMY    . INCISION AND DRAINAGE ABSCESS Right 02/11/2015   Procedure: INCISION AND DRAINAGE BREAST ABSCESS, RIGHT  BREAST BIOPSY;  Surgeon: Fanny Skates, MD;  Location: [facility];  Service: General;  Laterality: Right;  . IR GENERIC HISTORICAL  10/09/2015   IR FLUORO GUIDE PORT INSERTION LEFT 10/09/2015 Sandi Mariscal, MD [facility]  . IR GENERIC HISTORICAL  10/09/2015   IR US GUIDE VASC ACCESS LEFT 10/09/2015 Sandi Mariscal, MD [facility]  . IR REMOVAL TUN ACCESS W/ PORT W/O FL MOD SED  05/31/2016  . TONSILLECTOMY      There were no vitals filed for this visit.   Subjective Assessment - 11/04/17 0850    Subjective  When I do a lot of bending my head down at work I have a lot of soreness in the left side and it goes down in to the shoulder. It comes and goes but seems to be getting worse. It started about 3 months ago. I finished radiation in Jan 2018.     Pertinent History  Diagnosed with diffuse large B-cell lymphoma of nasopharynx, base of tongue, and right neck lymph node.  Completed chemotherapy 11/24/15; PET on 12/15/15 showed complete response.  Adjuvant XRT completed Jan 2018    Patient Stated Goals  to get rid of neck pain    Currently in Pain?  No/denies         [facility] PT Assessment - 11/04/17 0001      Assessment   Medical Diagnosis  diffuse large B-cell lymphoma of nasopharynx, base of tongue, and right neck lymph node    Referring Provider (PT)  Dr. Eppie Gibson    Hand Dominance  Right    Prior Therapy  none      Precautions   Precautions  Other (comment)    Precaution Comments  at risk for lymphedema   wearing mask today due to low blood counts     Restrictions   Weight Bearing Restrictions  No      Balance Screen   Has the patient fallen in the past 6 months  No    Has the patient had a decrease in activity level because of a fear of falling?   No    Is the patient reluctant to leave their home because  of a fear of falling?   No      Home Film/video editor residence    Living Arrangements  Spouse/significant other   has two children in their 71s   Type of [place]  Two level      Prior Function   Level of Independence  Independent    Vocation  Full time employment    Vocation Requirements  work for [employer], standing, bending, lifting    Leisure  water aerobics 3x/wk      Cognition   Overall Cognitive Status  Within Functional Limits for tasks assessed      Observation/Other Assessments   Observations  --      Functional Tests   Functional tests  Sit to Stand      Sit to Stand   Comments  17 repetitions in 30 seconds, above average for age   mildly SOB following this test     Posture/Postural Control   Posture/Postural Control  Postural limitations    Postural Limitations  Rounded Shoulders      AROM   Overall AROM Comments  neck and shoulder AROM  grossly WFL   pt had increased neck pain with all neck motions     Palpation   Palpation comment  left upper trap, levator and rhomboids tight and sore to the touch      Ambulation/Gait   Ambulation/Gait  Yes    Ambulation/Gait Assistance  7: Independent        LYMPHEDEMA/ONCOLOGY QUESTIONNAIRE - 11/04/17 0904      Type   Cancer Type  lymphoma      Treatment   Past Chemotherapy Treatment  Yes    Date  11/24/15    Active Radiation Treatment  No    Date  --   to begin soon (late Nov./early Dec.)   Body Site  anterior neck   lymphoma was on right side of neck   Past Radiation Treatment  No      Lymphedema Assessments   Lymphedema Assessments  Head and Neck      Head and Neck   4 cm superior to sternal notch around neck  35.3 cm    6 cm superior to sternal notch around neck  34.8 cm    8 cm superior to sternal notch around neck  35.5 cm             Objective measurements completed on examination: See above findings.      [facility] Adult PT Treatment/Exercise - 11/04/17 0001      Exercises   Exercises  Neck      Neck Exercises: Supine   Neck Retraction  10 reps;5 secs    Other Supine Exercise  6 way neck exercise: flexion, extension, R and L s/b, R and L rotation    pt had discomfort in left neck with right sidebending     Manual Therapy   Manual Therapy  Manual Traction    Manual Traction  gentle to neck x 3 with 1 min holds with pt reporting decreased pain             PT Education - 11/04/17 0945    Education provided  Yes    Education Details  neck ROM exercises in seated and supine and neck retraction, changing workspace to improve ergonomics, taking breaks when neck pain starts at work and doing gentle PROM, using tennis ball on wall  for massage to back    Person(s) Educated  Patient    Methods  Explanation    Comprehension  Verbalized understanding          PT Long Term Goals - 11/04/17 0952      PT LONG TERM GOAL #1   Title  Pt will  report a 70% decrease in neck pain while at work to allow improved comfort    Time  4    Period  Weeks    Status  New    [place] Date  12/02/17      PT LONG TERM GOAL #2   Title  Pt will be independent in a home exercise program for continued stretching and strengthening of neck and shoulders    Time  4    Period  Weeks    Status  New    Target Date  12/02/17      PT LONG TERM GOAL #3   Title  Pt will report no pain in left neck with supine R cervical sidebending to allow improved comfort    Time  4    Period  Weeks    Status  New    Target Date  12/02/17             Plan - 11/04/17 0946    Clinical Impression Statement  Pt presents to PT with neck pain and shoulder pain mainly on left side. She has a previous history of large B cell lymphoma and completed chemo and radiation to her neck as of 2018. She reports in the past 3 months she has been having neck pain at work that worsens as the days goes on and with the more work that she does. She reports the pain is worse on the left side and goes down in to her shoulder area. Her left rhomboids, upper traps, and levator are all very tight when palpated and tender with pressure. Pt would benefit from skilled PT services to decrease neck pain, increase neck strength and instruct pt in a home exercise program.     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, lifting, and looking down, previous hx of neck radiation    Clinical Presentation  Evolving    Clinical Presentation due to:  pt's job requires her to move her neck in ways to increase soreness    Clinical Decision Making  Moderate    Rehab Potential  Good    Clinical Impairments Affecting Rehab Potential  hx of radiation    PT Frequency  2x / week    PT Duration  4 weeks    PT Treatment/Interventions  ADLs/Self Care Home Management;Therapeutic exercise;Therapeutic activities;Patient/family education;Manual  techniques;Passive range of motion;Taping;Joint Manipulations    PT Next Visit Plan  begin myofascial release and soft tissue to left neck and shoulders, give seated neck retraction if no pain, assess pain levels since exercises were given last session    PT Home Exercise Plan  supine 6 way neck, supine neck retractions, seated neck AROM at work    [employer] and Agree with Plan of Care  Patient       Patient will benefit from skilled therapeutic intervention in order to improve the following deficits and impairments:  Pain, Improper body mechanics, Postural dysfunction, Decreased strength  Visit Diagnosis: Cervicalgia  Abnormal posture  Muscle weakness (generalized)     Problem List Patient Active Problem List  Diagnosis Date Noted  . Obesity (BMI 35.0-39.9 without comorbidity) 04/22/2017  . Other fatigue 04/22/2017  . Weight gain 12/24/2016  . Xerostomia due to radiotherapy 08/15/2016  . Abnormal TSH 02/13/2016  . Nausea without vomiting 01/08/2016  . Full code status 10/10/2015  . Diffuse large B-cell lymphoma of lymph nodes of neck ([place]) 10/01/2015  . Left breast abscess 02/11/2015    Allyson Sabal [facility] 11/04/2017, 9:55 AM  [place], [place], [address] Phone: [phone]   Fax:  [phone]  Name: Charlotte Davidson MRN: [id number] Date of Birth: [birth year]  Manus Gunning, PT 11/04/17 9:55 AM

## 2017-11-11 ENCOUNTER — Encounter

## 2017-11-15 ENCOUNTER — Ambulatory Visit: Payer: POS

## 2017-11-15 DIAGNOSIS — M542 Cervicalgia: Secondary | ICD-10-CM

## 2017-11-15 DIAGNOSIS — R293 Abnormal posture: Secondary | ICD-10-CM

## 2017-11-15 DIAGNOSIS — M6281 Muscle weakness (generalized): Secondary | ICD-10-CM

## 2017-11-15 NOTE — Therapy (Signed)
[place], [place], [address] Phone: [phone]   Fax:  [phone]  Physical Therapy Treatment  Patient Details  Name: Charlotte Davidson MRN: [id number] Date of Birth: 01-Sep-1960 Referring Provider (PT): Dr. Eppie Gibson   Encounter Date: 11/15/2017  PT End of Session - 11/15/17 0850    Visit Number  2    Number of Visits  9    Date for PT Re-Evaluation  12/02/17    PT Start Time  0800    PT Stop Time  0849    PT Time Calculation (min)  49 min    Activity Tolerance  Patient tolerated treatment well    Behavior During Therapy  [facility] for tasks assessed/performed       Past Medical History:  Diagnosis Date  . Dry mouth   . History of chemotherapy   . History of radiation therapy 01/01/2016- 01/27/2016   Oropharynx  . Lymphoma [facility])     Past Surgical History:  Procedure Laterality Date  . BREAST CYST ASPIRATION Right 01/24/2015  . BREAST SURGERY  01.17.2017   incise and drain right breast abscess  . CESAREAN SECTION  09.10.1996  . CHOLECYSTECTOMY    . INCISION AND DRAINAGE ABSCESS Right 02/11/2015   Procedure: INCISION AND DRAINAGE BREAST ABSCESS, RIGHT  BREAST BIOPSY;  Surgeon: Fanny Skates, MD;  [facility]: [facility];  Service: General;  Laterality: Right;  . IR GENERIC HISTORICAL  10/09/2015   IR FLUORO GUIDE PORT INSERTION LEFT 10/09/2015 Sandi Mariscal, MD [facility]  . IR GENERIC HISTORICAL  10/09/2015   IR US GUIDE VASC ACCESS LEFT 10/09/2015 Sandi Mariscal, MD [facility]  . IR REMOVAL TUN ACCESS W/ PORT W/O FL MOD SED  05/31/2016  . TONSILLECTOMY      There were no vitals filed for this visit.  Subjective Assessment - 11/15/17 0803    Subjective  The exercises have really been helping and I was able to get transferred to another department a week ago at work so it's not as busy. Overall my neck is doing better.     Pertinent History  Diagnosed with diffuse large B-cell lymphoma of nasopharynx,  base of tongue, and right neck lymph node.  Completed chemotherapy 11/24/15; PET on 12/15/15 showed complete response.  Adjuvant XRT completed Jan 2018    Patient Stated Goals  to get rid of neck pain    Currently in Pain?  No/denies                       [facility] Adult PT Treatment/Exercise - 11/15/17 0001      Manual Therapy   Manual Therapy  Soft tissue mobilization;Manual Traction;Passive ROM    Soft tissue mobilization  To Lt>Rt cervical muscles, especially at upper traps, rhomboids and scalenes in supine    Passive ROM  To bil cervical muscles into side bend, rotation and extension; then to Lt shoulder into flexion and abduction with scapular depression throughout.     Manual Traction  gentle to neck x 3 with 1 min holds with pt reporting decreased pain             PT Education - 11/15/17 0850    Education provided  Yes    Education Details  Briefly instructed pt in seated cervical retraction which she tolerated well and encouraged her to incorporate this into her day at work, when seated in car and when watching TV .    Person(s)  Educated  Patient    Methods  Explanation;Demonstration    Comprehension  Verbalized understanding;Returned demonstration          PT Long Term Goals - 11/04/17 0952      PT LONG TERM GOAL #1   Title  Pt will report a 70% decrease in neck pain while at work to allow improved comfort    Time  4    Period  Weeks    Status  New    Target Date  12/02/17      PT LONG TERM GOAL #2   Title  Pt will be independent in a home exercise program for continued stretching and strengthening of neck and shoulders    Time  4    Period  Weeks    Status  New    Target Date  12/02/17      PT LONG TERM GOAL #3   Title  Pt will report no pain in left neck with supine R cervical sidebending to allow improved comfort    Time  4    Period  Weeks    Status  New    Target Date  12/02/17            Plan - 11/15/17 0851    Clinical  Impression Statement  Pt tolerated first session of manual therapy very well. Her soft tissue was only mildly tight at end cervical P/ROM. Lt shoulder min-mod tightness at end of P/ROM. She has become independent over past week with inital HEP reporting this has helped tremendously, but also she was able to get a transfer at work to another building and she repors this location less busy and less strenuous for her. Pt reported feeling even looser by end of session today and tightness diminished.     Rehab Potential  Good    Clinical Impairments Affecting Rehab Potential  hx of radiation    PT Frequency  2x / week    PT Duration  4 weeks    PT Treatment/Interventions  ADLs/Self Care Home Management;Therapeutic exercise;Therapeutic activities;Patient/family education;Manual techniques;Passive range of motion;Taping;Joint Manipulations    PT Next Visit Plan  Cont myofascial release and soft tissue to left neck and shoulder, progress HEP to include supine cervical stabs next, or supine scapular series with cervical retraction throughout.Marland KitchenMarland Kitchen??    Consulted and Agree with Plan of Care  Patient       Patient will benefit from skilled therapeutic intervention in order to improve the following deficits and impairments:  Pain, Improper body mechanics, Postural dysfunction, Decreased strength  Visit Diagnosis: Cervicalgia  Abnormal posture  Muscle weakness (generalized)     Problem List Patient Active Problem List   Diagnosis Date Noted  . Obesity (BMI 35.0-39.9 without comorbidity) 04/22/2017  . Other fatigue 04/22/2017  . Weight gain 12/24/2016  . Xerostomia due to radiotherapy 08/15/2016  . Abnormal TSH 02/13/2016  . Nausea without vomiting 01/08/2016  . Full code status 10/10/2015  . Diffuse large B-cell lymphoma of lymph nodes of neck ([place]) 10/01/2015  . Left breast abscess 02/11/2015    Otelia Limes, PTA 11/15/2017, 8:56 AM  [place], [place], [address] Phone: [phone]   Fax:  [phone]  Name: Charlotte Davidson MRN: [id number] Date of Birth: May 06, 1960

## 2017-11-16 ENCOUNTER — Encounter

## 2017-11-17 ENCOUNTER — Ambulatory Visit: Payer: POS | Admitting: Physical Therapy

## 2017-11-17 ENCOUNTER — Other Ambulatory Visit: Payer: Self-pay

## 2017-11-17 ENCOUNTER — Encounter: Payer: Self-pay | Admitting: Physical Therapy

## 2017-11-17 DIAGNOSIS — M6281 Muscle weakness (generalized): Secondary | ICD-10-CM

## 2017-11-17 DIAGNOSIS — M542 Cervicalgia: Secondary | ICD-10-CM | POA: Diagnosis not present

## 2017-11-17 DIAGNOSIS — R293 Abnormal posture: Secondary | ICD-10-CM

## 2017-11-17 NOTE — Therapy (Signed)
[place], [place], [address] Phone: [phone]   Fax:  [phone]  Physical Therapy Treatment  Patient Details  Name: Charlotte Davidson MRN: [id number] Date of Birth: 1960-07-21 Referring Provider (PT): Dr. Eppie Gibson   Encounter Date: 11/17/2017  PT End of Session - 11/17/17 1133    Visit Number  3    Number of Visits  9    Date for PT Re-Evaluation  12/02/17    PT Start Time  0849    PT Stop Time  0931    PT Time Calculation (min)  42 min    Activity Tolerance  Patient tolerated treatment well    Behavior During Therapy  [facility] for tasks assessed/performed       Past Medical History:  Diagnosis Date  . Dry mouth   . History of chemotherapy   . History of radiation therapy 01/01/2016- 01/27/2016   Oropharynx  . Lymphoma [facility])     Past Surgical History:  Procedure Laterality Date  . BREAST CYST ASPIRATION Right 01/24/2015  . BREAST SURGERY  01.17.2017   incise and drain right breast abscess  . CESAREAN SECTION  09.10.1996  . CHOLECYSTECTOMY    . INCISION AND DRAINAGE ABSCESS Right 02/11/2015   Procedure: INCISION AND DRAINAGE BREAST ABSCESS, RIGHT  BREAST BIOPSY;  Surgeon: Fanny Skates, MD;  [facility];  Service: General;  Laterality: Right;  . IR GENERIC HISTORICAL  10/09/2015   IR FLUORO GUIDE PORT INSERTION LEFT 10/09/2015 Sandi Mariscal, MD [facility]  . IR GENERIC HISTORICAL  10/09/2015   IR US GUIDE VASC ACCESS LEFT 10/09/2015 Sandi Mariscal, MD [facility]  . IR REMOVAL TUN ACCESS W/ PORT W/O FL MOD SED  05/31/2016  . TONSILLECTOMY      There were no vitals filed for this visit.  Subjective Assessment - 11/17/17 0852    Subjective  My neck is doing better and the exercises are helping.     Pertinent History  Diagnosed with diffuse large B-cell lymphoma of nasopharynx, base of tongue, and right neck lymph node.  Completed chemotherapy 11/24/15; PET on 12/15/15 showed complete  response.  Adjuvant XRT completed Jan 2018    Patient Stated Goals  to get rid of neck pain    Currently in Pain?  No/denies                       [facility] Adult PT Treatment/Exercise - 11/17/17 0001      Exercises   Exercises  Neck      Neck Exercises: Prone   Other Prone Exercise  shoulder depression with neck retraction and neck and upper back slight extension x 5 sec holds x 5      Manual Therapy   Soft tissue mobilization  To Lt>Rt cervical muscles, especially at upper traps, rhomboids and scalenes in supine and sitting    Passive ROM  To bil cervical muscles into side bend, rotation    Manual Traction  gentle to neck x 3 with 1 min holds with pt reporting decreased pain                  PT Long Term Goals - 11/04/17 5188      PT LONG TERM GOAL #1   Title  Pt will report a 70% decrease in neck pain while at work to allow improved comfort    Time  4    Period  Weeks  Status  New    Target Date  12/02/17      PT LONG TERM GOAL #2   Title  Pt will be independent in a home exercise program for continued stretching and strengthening of neck and shoulders    Time  4    Period  Weeks    Status  New    Target Date  12/02/17      PT LONG TERM GOAL #3   Title  Pt will report no pain in left neck with supine R cervical sidebending to allow improved comfort    Time  4    Period  Weeks    Status  New    Target Date  12/02/17            Plan - 11/17/17 1135    Clinical Impression Statement  Pt states her pain has been improved since her last visit and she does not seem to have as much pain at work. Continued with manual therapy to neck and shoulders today. She still has increased tightness in right upper trap and levator and left cervical muscles. Instructed pt in new cervical retraction and extension exercise in prone.     Rehab Potential  Good    Clinical Impairments Affecting Rehab Potential  hx of radiation    PT Frequency  2x / week    PT  Duration  4 weeks    PT Treatment/Interventions  ADLs/Self Care Home Management;Therapeutic exercise;Therapeutic activities;Patient/family education;Manual techniques;Passive range of motion;Taping;Joint Manipulations    PT Next Visit Plan  Cont myofascial release and soft tissue to left neck and shoulder, progress HEP to include supine cervical stabs next, or supine scapular series with cervical retraction throughout.Marland KitchenMarland Kitchen??    PT Home Exercise Plan  supine 6 way neck, supine neck retractions, seated neck AROM at work    [employer] and Agree with Plan of Care  Patient       Patient will benefit from skilled therapeutic intervention in order to improve the following deficits and impairments:  Pain, Improper body mechanics, Postural dysfunction, Decreased strength  Visit Diagnosis: Cervicalgia  Abnormal posture  Muscle weakness (generalized)     Problem List Patient Active Problem List   Diagnosis Date Noted  . Obesity (BMI 35.0-39.9 without comorbidity) 04/22/2017  . Other fatigue 04/22/2017  . Weight gain 12/24/2016  . Xerostomia due to radiotherapy 08/15/2016  . Abnormal TSH 02/13/2016  . Nausea without vomiting 01/08/2016  . Full code status 10/10/2015  . Diffuse large B-cell lymphoma of lymph nodes of neck ([place]) 10/01/2015  . Left breast abscess 02/11/2015    Allyson Sabal [facility] 11/17/2017, 11:37 AM  [place], [place], [address] Phone: [phone]   Fax:  [phone]  Name: Charlotte Davidson MRN: [id number] Date of Birth: 1960-11-27  Manus Gunning, PT 11/17/17 11:37 AM

## 2017-11-22 ENCOUNTER — Ambulatory Visit: Payer: POS | Admitting: Physical Therapy

## 2017-11-22 ENCOUNTER — Encounter: Payer: Self-pay | Admitting: Physical Therapy

## 2017-11-22 DIAGNOSIS — M542 Cervicalgia: Secondary | ICD-10-CM | POA: Diagnosis not present

## 2017-11-22 DIAGNOSIS — R293 Abnormal posture: Secondary | ICD-10-CM

## 2017-11-22 NOTE — Patient Instructions (Signed)

## 2017-11-22 NOTE — Therapy (Signed)
[place], [place], [address] Phone: [phone]   Fax:  [phone]  Physical Therapy Treatment  Patient Details  Name: Charlotte Davidson MRN: [id number] Date of Birth: [birth year] Referring Provider (PT): Dr. Eppie Gibson   Encounter Date: 11/22/2017  PT End of Session - 11/22/17 1214    Visit Number  4    Number of Visits  9    Date for PT Re-Evaluation  12/02/17    PT Start Time  0850    PT Stop Time  0932    PT Time Calculation (min)  42 min    Activity Tolerance  Patient tolerated treatment well    Behavior During Therapy  [facility] for tasks assessed/performed       Past Medical History:  Diagnosis Date  . Dry mouth   . History of chemotherapy   . History of radiation therapy 01/01/2016- 01/27/2016   Oropharynx  . Lymphoma [facility])     Past Surgical History:  Procedure Laterality Date  . BREAST CYST ASPIRATION Right 01/24/2015  . BREAST SURGERY  01.17.2017   incise and drain right breast abscess  . CESAREAN SECTION  09.10.1996  . CHOLECYSTECTOMY    . INCISION AND DRAINAGE ABSCESS Right 02/11/2015   Procedure: INCISION AND DRAINAGE BREAST ABSCESS, RIGHT  BREAST BIOPSY;  Surgeon: Fanny Skates, MD;  [facility];  Service: General;  Laterality: Right;  . IR GENERIC HISTORICAL  10/09/2015   IR FLUORO GUIDE PORT INSERTION LEFT 10/09/2015 Sandi Mariscal, MD [facility]  . IR GENERIC HISTORICAL  10/09/2015   IR US GUIDE VASC ACCESS LEFT 10/09/2015 Sandi Mariscal, MD [facility]  . IR REMOVAL TUN ACCESS W/ PORT W/O FL MOD SED  05/31/2016  . TONSILLECTOMY      There were no vitals filed for this visit.  Subjective Assessment - 11/22/17 0851    Subjective  It helped last time but I was sore for a couple days.     Pertinent History  Diagnosed with diffuse large B-cell lymphoma of nasopharynx, base of tongue, and right neck lymph node.  Completed chemotherapy 11/24/15; PET on 12/15/15 showed complete  response.  Adjuvant XRT completed Jan 2018    Patient Stated Goals  to get rid of neck pain    Currently in Pain?  No/denies                       [facility] Adult PT Treatment/Exercise - 11/22/17 0001      Exercises   Exercises  Shoulder      Shoulder Exercises: Supine   Horizontal ABduction  Strengthening;Both;10 reps   pt returned demo    Theraband Level (Shoulder Horizontal ABduction)  Level 2 (Red)    External Rotation  Strengthening;Both;10 reps   pt returned therapist demo   Theraband Level (Shoulder External Rotation)  Level 2 (Red)    Flexion  Strengthening;Both;10 reps   narrow and wide grip, pt returned therapist demo   Diagonals  Strengthening;Both;10 reps   pt returned therapist demo   Theraband Level (Shoulder Diagonals)  Level 2 (Red)    Other Supine Exercises  All exercises done while keeping neck retracted      Manual Therapy   Soft tissue mobilization  in R s/l: to leftcervical muscles, especially at upper traps, rhomboids and scalenes in supine and sitting    Manual Traction  gentle to neck x 3 with 30 sec holds with pt reporting decreased pain  PT Long Term Goals - 11/04/17 0952      PT LONG TERM GOAL #1   Title  Pt will report a 70% decrease in neck pain while at work to allow improved comfort    Time  4    Period  Weeks    Status  New    Target Date  12/02/17      PT LONG TERM GOAL #2   Title  Pt will be independent in a home exercise program for continued stretching and strengthening of neck and shoulders    Time  4    Period  Weeks    Status  New    Target Date  12/02/17      PT LONG TERM GOAL #3   Title  Pt will report no pain in left neck with supine R cervical sidebending to allow improved comfort    Time  4    Period  Weeks    Status  New    Target Date  12/02/17            Plan - 11/22/17 1214    Clinical Impression Statement  Instructed pt in supine scapular exercises today compined with  neck retraction and issued these at part of a home exercise program. Continued manual therapy to left neck to help decrease pain and tightness.     Rehab Potential  Good    Clinical Impairments Affecting Rehab Potential  hx of radiation    PT Frequency  2x / week    PT Duration  4 weeks    PT Treatment/Interventions  ADLs/Self Care Home Management;Therapeutic exercise;Therapeutic activities;Patient/family education;Manual techniques;Passive range of motion;Taping;Joint Manipulations    PT Next Visit Plan  Cont myofascial release and soft tissue to left neck and shoulder, progress HEP to include supine cervical stabs next, or supine scapular series with cervical retraction throughout.Marland KitchenMarland Kitchen??    PT Home Exercise Plan  supine 6 way neck, supine neck retractions, seated neck AROM at work, neck retraction with supine scap series    Consulted and Agree with Plan of Care  Patient       Patient will benefit from skilled therapeutic intervention in order to improve the following deficits and impairments:  Pain, Improper body mechanics, Postural dysfunction, Decreased strength  Visit Diagnosis: Cervicalgia  Abnormal posture     Problem List Patient Active Problem List   Diagnosis Date Noted  . Obesity (BMI 35.0-39.9 without comorbidity) 04/22/2017  . Other fatigue 04/22/2017  . Weight gain 12/24/2016  . Xerostomia due to radiotherapy 08/15/2016  . Abnormal TSH 02/13/2016  . Nausea without vomiting 01/08/2016  . Full code status 10/10/2015  . Diffuse large B-cell lymphoma of lymph nodes of neck ([place]) 10/01/2015  . Left breast abscess 02/11/2015    Allyson Sabal [facility] 11/22/2017, 12:17 PM  [place], [place], [address] Phone: [phone]   Fax:  [phone]  Name: Charlotte Davidson MRN: [id number] Date of Birth: Aug 09, 1960  Manus Gunning, PT 11/22/17 12:18 PM

## 2017-11-24 ENCOUNTER — Other Ambulatory Visit: Payer: Self-pay

## 2017-11-24 ENCOUNTER — Encounter: Payer: Self-pay | Admitting: Physical Therapy

## 2017-11-24 ENCOUNTER — Ambulatory Visit: Payer: POS | Admitting: Physical Therapy

## 2017-11-24 DIAGNOSIS — R293 Abnormal posture: Secondary | ICD-10-CM

## 2017-11-24 DIAGNOSIS — M6281 Muscle weakness (generalized): Secondary | ICD-10-CM

## 2017-11-24 DIAGNOSIS — M542 Cervicalgia: Secondary | ICD-10-CM | POA: Diagnosis not present

## 2017-11-24 NOTE — Therapy (Signed)
[place], [place], [address] Phone: [phone]   Fax:  [phone]  Physical Therapy Treatment  Patient Details  Name: Charlotte Davidson MRN: [id number] Date of Birth: January 01, 1961 Referring Provider (PT): Dr. Eppie Gibson   Encounter Date: 11/24/2017  PT End of Session - 11/24/17 0927    Visit Number  5    Number of Visits  9    Date for PT Re-Evaluation  12/02/17    PT Start Time  0846    PT Stop Time  0926    PT Time Calculation (min)  40 min    Activity Tolerance  Patient tolerated treatment well    Behavior During Therapy  [facility] for tasks assessed/performed       Past Medical History:  Diagnosis Date  . Dry mouth   . History of chemotherapy   . History of radiation therapy 01/01/2016- 01/27/2016   Oropharynx  . Lymphoma [facility])     Past Surgical History:  Procedure Laterality Date  . BREAST CYST ASPIRATION Right 01/24/2015  . BREAST SURGERY  01.17.2017   incise and drain right breast abscess  . CESAREAN SECTION  09.10.1996  . CHOLECYSTECTOMY    . INCISION AND DRAINAGE ABSCESS Right 02/11/2015   Procedure: INCISION AND DRAINAGE BREAST ABSCESS, RIGHT  BREAST BIOPSY;  Surgeon: Fanny Skates, MD;  [facility];  Service: General;  Laterality: Right;  . IR GENERIC HISTORICAL  10/09/2015   IR FLUORO GUIDE PORT INSERTION LEFT 10/09/2015 Sandi Mariscal, MD [facility]  . IR GENERIC HISTORICAL  10/09/2015   IR US GUIDE VASC ACCESS LEFT 10/09/2015 Sandi Mariscal, MD [facility]  . IR REMOVAL TUN ACCESS W/ PORT W/O FL MOD SED  05/31/2016  . TONSILLECTOMY      There were no vitals filed for this visit.  Subjective Assessment - 11/24/17 0847    Subjective  I haven't been having any pain.     Pertinent History  Diagnosed with diffuse large B-cell lymphoma of nasopharynx, base of tongue, and right neck lymph node.  Completed chemotherapy 11/24/15; PET on 12/15/15 showed complete response.  Adjuvant XRT  completed Jan 2018    Patient Stated Goals  to get rid of neck pain    Currently in Pain?  No/denies                       [facility] Adult PT Treatment/Exercise - 11/24/17 0001      Shoulder Exercises: Supine   Horizontal ABduction  Strengthening;Both;10 reps   pt returned demo    Theraband Level (Shoulder Horizontal ABduction)  Level 3 (Green)    External Rotation  Strengthening;Both;10 reps   pt returned therapist demo   Theraband Level (Shoulder External Rotation)  Level 3 (Green)    Flexion  Strengthening;Both;10 reps   narrow and wide grip, pt returned therapist demo   Diagonals  Strengthening;Both;10 reps   pt returned therapist demo   Theraband Level (Shoulder Diagonals)  Level 3 (Green)      Manual Therapy   Soft tissue mobilization  in R s/l: to leftcervical muscles, especially at upper traps, rhomboids and scalenes                  PT Long Term Goals - 11/24/17 0847      PT LONG TERM GOAL #1   Title  Pt will report a 70% decrease in neck pain while at work to allow improved comfort  Baseline  11/24/17- 98%     Time  4    Period  Weeks    Status  Achieved      PT LONG TERM GOAL #2   Title  Pt will be independent in a home exercise program for continued stretching and strengthening of neck and shoulders    Time  4    Period  Weeks    Status  Achieved      PT LONG TERM GOAL #3   Title  Pt will report no pain in left neck with supine R cervical sidebending to allow improved comfort    Baseline  11/24/17- no pain with this    Time  4    Period  Weeks    Status  Achieved            Plan - 11/24/17 5883    Clinical Impression Statement  Pt has now met all goals for therapy. She is no longer having any neck pain and she is not having neck pain with neck ROM. Pt feels ready to be discharged from skilled PT services at this time. She is independent with her home exercise program with an emphasis on postural exercises and neck extensor  strengthening exercises.     Rehab Potential  Good    Clinical Impairments Affecting Rehab Potential  hx of radiation    PT Frequency  2x / week    PT Duration  4 weeks    PT Treatment/Interventions  ADLs/Self Care Home Management;Therapeutic exercise;Therapeutic activities;Patient/family education;Manual techniques;Passive range of motion;Taping;Joint Manipulations    PT Next Visit Plan  dc this visit    PT Home Exercise Plan  supine 6 way neck, supine neck retractions, seated neck AROM at work, neck retraction with supine scap series    Consulted and Agree with Plan of Care  Patient       Patient will benefit from skilled therapeutic intervention in order to improve the following deficits and impairments:  Pain, Improper body mechanics, Postural dysfunction, Decreased strength  Visit Diagnosis: Cervicalgia  Abnormal posture  Muscle weakness (generalized)     Problem List Patient Active Problem List   Diagnosis Date Noted  . Obesity (BMI 35.0-39.9 without comorbidity) 04/22/2017  . Other fatigue 04/22/2017  . Weight gain 12/24/2016  . Xerostomia due to radiotherapy 08/15/2016  . Abnormal TSH 02/13/2016  . Nausea without vomiting 01/08/2016  . Full code status 10/10/2015  . Diffuse large B-cell lymphoma of lymph nodes of neck ([place]) 10/01/2015  . Left breast abscess 02/11/2015    Allyson Sabal [facility] 11/24/2017, 9:29 AM  [place], [place], [address] Phone: [phone]   Fax:  [phone]  Name: Charlotte Davidson MRN: [id number] Date of Birth: January 25, 1961  Manus Gunning, PT 11/24/17 9:30 AM  PHYSICAL THERAPY DISCHARGE SUMMARY  Visits from Start of Care: 5  Current functional level related to goals / functional outcomes: All goals met   Remaining deficits: None   Education / Equipment: HEP  Plan: Patient agrees to discharge.  Patient goals were met. Patient is being  discharged due to meeting the stated rehab goals.  ?????    [employer], [place] 11/24/17 9:30 AM

## 2017-11-29 ENCOUNTER — Ambulatory Visit: Payer: POS | Admitting: Physical Therapy

## 2017-12-01 ENCOUNTER — Encounter: Payer: POS | Admitting: Physical Therapy

## 2018-03-20 ENCOUNTER — Encounter: Payer: Self-pay | Admitting: Podiatry

## 2018-03-20 ENCOUNTER — Ambulatory Visit (INDEPENDENT_AMBULATORY_CARE_PROVIDER_SITE_OTHER): Payer: POS

## 2018-03-20 ENCOUNTER — Ambulatory Visit: Payer: POS | Admitting: Podiatry

## 2018-03-20 DIAGNOSIS — M722 Plantar fascial fibromatosis: Secondary | ICD-10-CM

## 2018-03-20 NOTE — Patient Instructions (Addendum)
Plantar Fasciitis (Heel Spur Syndrome) with Rehab The plantar fascia is a fibrous, ligament-like, soft-tissue structure that spans the bottom of the foot. Plantar fasciitis is a condition that causes pain in the foot due to inflammation of the tissue. SYMPTOMS   Pain and tenderness on the underneath side of the foot.  Pain that worsens with standing or walking. CAUSES  Plantar fasciitis is caused by irritation and injury to the plantar fascia on the underneath side of the foot. Common mechanisms of injury include:  Direct trauma to bottom of the foot.  Damage to a small nerve that runs under the foot where the main fascia attaches to the heel bone.  Stress placed on the plantar fascia due to bone spurs. RISK INCREASES WITH:   Activities that place stress on the plantar fascia (running, jumping, pivoting, or cutting).  Poor strength and flexibility.  Improperly fitted shoes.  Tight calf muscles.  Flat feet.  Failure to warm-up properly before activity.  Obesity. PREVENTION  Warm up and stretch properly before activity.  Allow for adequate recovery between workouts.  Maintain physical fitness:  Strength, flexibility, and endurance.  Cardiovascular fitness.  Maintain a health body weight.  Avoid stress on the plantar fascia.  Wear properly fitted shoes, including arch supports for individuals who have flat feet.  PROGNOSIS  If treated properly, then the symptoms of plantar fasciitis usually resolve without surgery. However, occasionally surgery is necessary.  RELATED COMPLICATIONS   Recurrent symptoms that may result in a chronic condition.  Problems of the lower back that are caused by compensating for the injury, such as limping.  Pain or weakness of the foot during push-off following surgery.  Chronic inflammation, scarring, and partial or complete fascia tear, occurring more often from repeated injections.  TREATMENT  Treatment initially involves the  use of ice and medication to help reduce pain and inflammation. The use of strengthening and stretching exercises may help reduce pain with activity, especially stretches of the Achilles tendon. These exercises may be performed at home or with a therapist. Your caregiver may recommend that you use heel cups of arch supports to help reduce stress on the plantar fascia. Occasionally, corticosteroid injections are given to reduce inflammation. If symptoms persist for greater than 6 months despite non-surgical (conservative), then surgery may be recommended.   MEDICATION   If pain medication is necessary, then nonsteroidal anti-inflammatory medications, such as aspirin and ibuprofen, or other minor pain relievers, such as acetaminophen, are often recommended.  Do not take pain medication within 7 days before surgery.  Prescription pain relievers may be given if deemed necessary by your caregiver. Use only as directed and only as much as you need.  Corticosteroid injections may be given by your caregiver. These injections should be reserved for the most serious cases, because they may only be given a certain number of times.  HEAT AND COLD  Cold treatment (icing) relieves pain and reduces inflammation. Cold treatment should be applied for 10 to 15 minutes every 2 to 3 hours for inflammation and pain and immediately after any activity that aggravates your symptoms. Use ice packs or massage the area with a piece of ice (ice massage).  Heat treatment may be used prior to performing the stretching and strengthening activities prescribed by your caregiver, physical therapist, or athletic trainer. Use a heat pack or soak the injury in warm water.  SEEK IMMEDIATE MEDICAL CARE IF:  Treatment seems to offer no benefit, or the condition worsens.  Any medications   produce adverse side effects.  EXERCISES- RANGE OF MOTION (ROM) AND STRETCHING EXERCISES - Plantar Fasciitis (Heel Spur Syndrome) These exercises  may help you when beginning to rehabilitate your injury. Your symptoms may resolve with or without further involvement from your physician, physical therapist or athletic trainer. While completing these exercises, remember:   Restoring tissue flexibility helps normal motion to return to the joints. This allows healthier, less painful movement and activity.  An effective stretch should be held for at least 30 seconds.  A stretch should never be painful. You should only feel a gentle lengthening or release in the stretched tissue.  RANGE OF MOTION - Toe Extension, Flexion  Sit with your right / left leg crossed over your opposite knee.  Grasp your toes and gently pull them back toward the top of your foot. You should feel a stretch on the bottom of your toes and/or foot.  Hold this stretch for 10 seconds.  Now, gently pull your toes toward the bottom of your foot. You should feel a stretch on the top of your toes and or foot.  Hold this stretch for 10 seconds. Repeat  times. Complete this stretch 3 times per day.   RANGE OF MOTION - Ankle Dorsiflexion, Active Assisted  Remove shoes and sit on a chair that is preferably not on a carpeted surface.  Place right / left foot under knee. Extend your opposite leg for support.  Keeping your heel down, slide your right / left foot back toward the chair until you feel a stretch at your ankle or calf. If you do not feel a stretch, slide your bottom forward to the edge of the chair, while still keeping your heel down.  Hold this stretch for 10 seconds. Repeat 3 times. Complete this stretch 2 times per day.   STRETCH  Gastroc, Standing  Place hands on wall.  Extend right / left leg, keeping the front knee somewhat bent.  Slightly point your toes inward on your back foot.  Keeping your right / left heel on the floor and your knee straight, shift your weight toward the wall, not allowing your back to arch.  You should feel a gentle stretch  in the right / left calf. Hold this position for 10 seconds. Repeat 3 times. Complete this stretch 2 times per day.  STRETCH  Soleus, Standing  Place hands on wall.  Extend right / left leg, keeping the other knee somewhat bent.  Slightly point your toes inward on your back foot.  Keep your right / left heel on the floor, bend your back knee, and slightly shift your weight over the back leg so that you feel a gentle stretch deep in your back calf.  Hold this position for 10 seconds. Repeat 3 times. Complete this stretch 2 times per day.  STRETCH  Gastrocsoleus, Standing  Note: This exercise can place a lot of stress on your foot and ankle. Please complete this exercise only if specifically instructed by your caregiver.   Place the ball of your right / left foot on a step, keeping your other foot firmly on the same step.  Hold on to the wall or a rail for balance.  Slowly lift your other foot, allowing your body weight to press your heel down over the edge of the step.  You should feel a stretch in your right / left calf.  Hold this position for 10 seconds.  Repeat this exercise with a slight bend in your right /   left knee. Repeat 3 times. Complete this stretch 2 times per day.   STRENGTHENING EXERCISES - Plantar Fasciitis (Heel Spur Syndrome)  These exercises may help you when beginning to rehabilitate your injury. They may resolve your symptoms with or without further involvement from your physician, physical therapist or athletic trainer. While completing these exercises, remember:   Muscles can gain both the endurance and the strength needed for everyday activities through controlled exercises.  Complete these exercises as instructed by your physician, physical therapist or athletic trainer. Progress the resistance and repetitions only as guided.  STRENGTH - Towel Curls  Sit in a chair positioned on a non-carpeted surface.  Place your foot on a towel, keeping your heel  on the floor.  Pull the towel toward your heel by only curling your toes. Keep your heel on the floor. Repeat 3 times. Complete this exercise 2 times per day.  STRENGTH - Ankle Inversion  Secure one end of a rubber exercise band/tubing to a fixed object (table, pole). Loop the other end around your foot just before your toes.  Place your fists between your knees. This will focus your strengthening at your ankle.  Slowly, pull your big toe up and in, making sure the band/tubing is positioned to resist the entire motion.  Hold this position for 10 seconds.  Have your muscles resist the band/tubing as it slowly pulls your foot back to the starting position. Repeat 3 times. Complete this exercises 2 times per day.  Document Released: 01/11/2005 Document Revised: 04/05/2011 Document Reviewed: 04/25/2008 [employer] Patient Information 2014 [employer]    Bunion  A bunion is a bump on the base of the big toe that forms when the bones of the big toe joint move out of position. Bunions may be small at first, but they often get larger over time. They can make walking painful. What are the causes? A bunion may be caused by:  Wearing narrow or pointed shoes that force the big toe to press against the other toes.  Abnormal foot development that causes the foot to roll inward (pronate).  Changes in the foot that are caused by certain diseases, such as rheumatoid arthritis or polio.  A foot injury. What increases the risk? The following factors may make you more likely to develop this condition:  Wearing shoes that squeeze the toes together.  Having certain diseases, such as: ? Rheumatoid arthritis. ? Polio. ? Cerebral palsy.  Having family members who have bunions.  Being born with a foot deformity, such as flat feet or low arches.  Doing activities that put a lot of pressure on the feet, such as ballet dancing. What are the signs or symptoms? The main symptom of a bunion is a  noticeable bump on the big toe. Other symptoms may include:  Pain.  Swelling around the big toe.  Redness and inflammation.  Thick or hardened skin on the big toe or between the toes.  Stiffness or loss of motion in the big toe.  Trouble with walking. How is this diagnosed? A bunion may be diagnosed based on your symptoms, medical history, and activities. You may have tests, such as:  X-rays. These allow your health care provider to check the position of the bones in your foot and look for damage to your joint. They also help your health care provider determine the severity of your bunion and the best way to treat it.  Joint aspiration. In this test, a sample of fluid is removed from   the toe joint. This test may be done if you are in a lot of pain. It helps rule out diseases that cause painful swelling of the joints, such as arthritis. How is this treated? Treatment depends on the severity of your symptoms. The goal of treatment is to relieve symptoms and prevent the bunion from getting worse. Your health care provider may recommend:  Wearing shoes that have a wide toe box.  Using bunion pads to cushion the affected area.  Taping your toes together to keep them in a normal position.  Placing a device inside your shoe (orthotics) to help reduce pressure on your toe joint.  Taking medicine to ease pain, inflammation, and swelling.  Applying heat or ice to the affected area.  Doing stretching exercises.  Surgery to remove scar tissue and move the toes back into their normal position. This treatment is rare. Follow these instructions at home: Managing pain, stiffness, and swelling   If directed, put ice on the painful area: ? Put ice in a plastic bag. ? Place a towel between your skin and the bag. ? Leave the ice on for 20 minutes, 2-3 times a day. Activity   If directed, apply heat to the affected area before you exercise. Use the heat source that your health care  provider recommends, such as a moist heat pack or a heating pad. ? Place a towel between your skin and the heat source. ? Leave the heat on for 20-30 minutes. ? Remove the heat if your skin turns bright red. This is especially important if you are unable to feel pain, heat, or cold. You may have a greater risk of getting burned.  Do exercises as told by your health care provider. General instructions  Support your toe joint with proper footwear, shoe padding, or taping as told by your health care provider.  Take over-the-counter and prescription medicines only as told by your health care provider.  Keep all follow-up visits as told by your health care provider. This is important. Contact a health care provider if your symptoms:  Get worse.  Do not improve in 2 weeks. Get help right away if you have:  Severe pain and trouble with walking. Summary  A bunion is a bump on the base of the big toe that forms when the bones of the big toe joint move out of position.  Bunions can make walking painful.  Treatment depends on the severity of your symptoms.  Support your toe joint with proper footwear, shoe padding, or taping as told by your health care provider. This information is not intended to replace advice given to you by your health care provider. Make sure you discuss any questions you have with your health care provider. Document Released: 01/11/2005 Document Revised: 05/24/2017 Document Reviewed: 05/24/2017 [employer] Interactive Patient Education  2019 [employer]  

## 2018-03-22 NOTE — Progress Notes (Signed)
Subjective:   Patient ID: Charlotte Davidson, female   DOB: 58 y.o.   MRN: [id number]   HPI Patient presents stating she has been having a lot of pain in the bottom of her heels and is hard for her to work all day and stand and her orthotics have worn out at the current time   Review of Systems  All other systems reviewed and are negative.       Objective:  Physical Exam Vitals signs and nursing note reviewed.  Constitutional:      Appearance: She is well-developed.  Pulmonary:     Effort: Pulmonary effort is normal.  Musculoskeletal: Normal range of motion.  Skin:    General: Skin is warm.  Neurological:     Mental Status: She is alert.     Neurovascular status intact with patient found to have significant flatfoot deformity bilateral moderate discomfort in the plantar heel region bilateral with inflammation fluid noted within the medial band of the heel.  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  Patient is casted for functional type orthotics to provide for good support and also patient will not do a job that requires consistent constant weightbearing  X-rays indicate that there is depression of the arch with multiple signs of arthritic conditions

## 2018-04-12 ENCOUNTER — Other Ambulatory Visit: Payer: POS | Admitting: Orthotics

## 2018-04-13 ENCOUNTER — Telehealth: Payer: Self-pay | Admitting: Hematology and Oncology

## 2018-04-13 ENCOUNTER — Telehealth: Payer: Self-pay

## 2018-04-13 NOTE — Telephone Encounter (Signed)
Called back with below message. She is good and is agreeable to move out appt 1 month. Scheduling message sent. She has a red/ white bump on her inner thigh that started a couple of days ago. She has had boils in the past. Instructed to call the office for questions or concerns. She verbalized understanding.

## 2018-04-13 NOTE — Telephone Encounter (Signed)
Called and left the below message. Ask her to call the office back.

## 2018-04-13 NOTE — Telephone Encounter (Signed)
-----   Message from Heath Lark, MD sent at 04/13/2018  7:52 AM EDT ----- Regarding: reschedule 1 month out If she has no symptoms, move her appt 1 month out

## 2018-04-13 NOTE — Telephone Encounter (Signed)
R/s appt per 3/19 sch message - pt aware of new date and time

## 2018-04-19 ENCOUNTER — Other Ambulatory Visit: Payer: POS

## 2018-04-19 ENCOUNTER — Ambulatory Visit: Payer: POS | Admitting: Hematology and Oncology

## 2018-05-03 ENCOUNTER — Telehealth: Payer: Self-pay | Admitting: Podiatry

## 2018-05-03 NOTE — Telephone Encounter (Signed)
Spoke to pt and cxled appt for 4.15 to [place].  Pt ok with [place] mailing them to her with the instructions and if any issues is to call to schedule an appt with Liliane Channel.

## 2018-05-10 ENCOUNTER — Other Ambulatory Visit: Payer: POS | Admitting: Orthotics

## 2018-05-23 ENCOUNTER — Telehealth: Payer: Self-pay | Admitting: *Deleted

## 2018-05-23 ENCOUNTER — Inpatient Hospital Stay: Payer: POS | Attending: Hematology and Oncology

## 2018-05-23 ENCOUNTER — Encounter: Payer: Self-pay | Admitting: Hematology and Oncology

## 2018-05-23 ENCOUNTER — Other Ambulatory Visit: Payer: Self-pay | Admitting: Hematology and Oncology

## 2018-05-23 ENCOUNTER — Inpatient Hospital Stay (HOSPITAL_BASED_OUTPATIENT_CLINIC_OR_DEPARTMENT_OTHER): Payer: POS | Admitting: Hematology and Oncology

## 2018-05-23 ENCOUNTER — Other Ambulatory Visit: Payer: Self-pay

## 2018-05-23 DIAGNOSIS — Z79899 Other long term (current) drug therapy: Secondary | ICD-10-CM | POA: Insufficient documentation

## 2018-05-23 DIAGNOSIS — Z8572 Personal history of non-Hodgkin lymphomas: Secondary | ICD-10-CM | POA: Insufficient documentation

## 2018-05-23 DIAGNOSIS — E669 Obesity, unspecified: Secondary | ICD-10-CM | POA: Diagnosis not present

## 2018-05-23 DIAGNOSIS — C8331 Diffuse large B-cell lymphoma, lymph nodes of head, face, and neck: Secondary | ICD-10-CM

## 2018-05-23 DIAGNOSIS — Z923 Personal history of irradiation: Secondary | ICD-10-CM | POA: Insufficient documentation

## 2018-05-23 DIAGNOSIS — I700 Atherosclerosis of aorta: Secondary | ICD-10-CM | POA: Diagnosis not present

## 2018-05-23 DIAGNOSIS — Z9221 Personal history of antineoplastic chemotherapy: Secondary | ICD-10-CM

## 2018-05-23 LAB — COMPREHENSIVE METABOLIC PANEL
ALT: 20 U/L (ref 0–44)
AST: 18 U/L (ref 15–41)
Albumin: 3.5 g/dL (ref 3.5–5.0)
Alkaline Phosphatase: 67 U/L (ref 38–126)
Anion gap: 7 (ref 5–15)
BUN: 15 mg/dL (ref 6–20)
CO2: 29 mmol/L (ref 22–32)
Calcium: 8.6 mg/dL — ABNORMAL LOW (ref 8.9–10.3)
Chloride: 104 mmol/L (ref 98–111)
Creatinine, Ser: 0.78 mg/dL (ref 0.44–1.00)
GFR calc Af Amer: >60 mL/min (ref >60)
GFR calc non Af Amer: >60 mL/min (ref >60)
Glucose, Bld: 75 mg/dL (ref 70–99)
Potassium: 4.2 mmol/L (ref 3.5–5.1)
Sodium: 140 mmol/L (ref 135–145)
Total Bilirubin: 0.3 mg/dL (ref 0.3–1.2)
Total Protein: 7.2 g/dL (ref 6.5–8.1)

## 2018-05-23 LAB — CBC WITH DIFFERENTIAL/PLATELET
Abs Immature Granulocytes: 0.02 10*3/uL (ref 0.00–0.07)
Basophils Absolute: 0 10*3/uL (ref 0.0–0.1)
Basophils Relative: 0 %
Eosinophils Absolute: 0.1 10*3/uL (ref 0.0–0.5)
Eosinophils Relative: 3 %
HCT: 38.7 % (ref 36.0–46.0)
Hemoglobin: 12.1 g/dL (ref 12.0–15.0)
Immature Granulocytes: 0 %
Lymphocytes Relative: 40 %
Lymphs Abs: 2 10*3/uL (ref 0.7–4.0)
MCH: 28.4 pg (ref 26.0–34.0)
MCHC: 31.3 g/dL (ref 30.0–36.0)
MCV: 90.8 fL (ref 80.0–100.0)
Monocytes Absolute: 0.6 10*3/uL (ref 0.1–1.0)
Monocytes Relative: 11 %
Neutro Abs: 2.3 10*3/uL (ref 1.7–7.7)
Neutrophils Relative %: 46 %
Platelets: 245 10*3/uL (ref 150–400)
RBC: 4.26 MIL/uL (ref 3.87–5.11)
RDW: 13.2 % (ref 11.5–15.5)
WBC: 5.1 10*3/uL (ref 4.0–10.5)
nRBC: 0 % (ref 0.0–0.2)

## 2018-05-23 LAB — LACTATE DEHYDROGENASE: LDH: 196 U/L — ABNORMAL HIGH (ref 98–192)

## 2018-05-23 NOTE — Assessment & Plan Note (Signed)
The patient has no clinical signs or symptoms to suggest disease recurrence I recommend follow-up without surveillance imaging

## 2018-05-23 NOTE — Telephone Encounter (Signed)
Called and spoke with the patient regarding her appts in October. Gave her the appt date/times

## 2018-05-23 NOTE — Progress Notes (Signed)
[place] OFFICE PROGRESS NOTE  Patient Care Team: Tisovec, Fransico Him, MD as PCP - General (Internal Medicine) Heath Lark, MD as Consulting Physician (Hematology and Oncology) Jodi Marble, MD as Consulting Physician (Otolaryngology) Eppie Gibson, MD as Attending Physician (Radiation Oncology)  ASSESSMENT & PLAN:  Diffuse large B-cell lymphoma of lymph nodes of neck [facility]) The patient has no clinical signs or symptoms to suggest disease recurrence I recommend follow-up without surveillance imaging   Obesity (BMI 35.0-39.9 without comorbidity) We discussed dietary modification and weight loss strategy   No orders of the defined types were placed in this encounter.   INTERVAL HISTORY: Please see below for problem oriented charting. She returns for further follow-up She denies new lymphadenopathy No recent infection, fever or chills Appetite is stable  SUMMARY OF ONCOLOGIC HISTORY:   Diffuse large B-cell lymphoma of lymph nodes of neck ([place])   09/24/2015 Pathology Results    Final Cytologic Interpretation [id number]: Right level II neck mass, Fine Needle Aspiration I (smears and Thinprep): Malignant cells present, most consistent with large cell lymphoma.     09/24/2015 Pathology Results    [phone] The nasopharyngeal biopsy shows lymphoid tissue with diffuse effacement of the normal nodal architecture by large cells with vesicular nuclei, prominent nucleoli, and scant cytoplasm. The large cells are positive for CD20, BCL-6, BCL-2, CD5, and MUM1 by immunohistochemistry. They are negative for CD30, CD3, CD10, cyclinD1 and SOX11. In situ hybridization for EBV mRNA is negative. Ki-67 reveals a proliferation rate of approximately 60%. By flow cytometry, the atypical cells express CD19, CD5, and CD23 (see below). The expression of CD5 and CD23 by flow cytometry is suggestive of small lymphocytic lymphoma/ chronic lymphocytic leukemia (SLL/CLL) transforming to a  diffuse large B-cell lymphoma (DLBCL); however, there was no evidence of SLL/CLL in the flow sample. Correlation with peripheral blood/ marrow findings would be useful to determine if this lymphoma arose from background SLL/CLL or is a de novo CD5+ DLBCL. De novo CD5+ DLBCL is frequently associated with an aggressive clinical course and poor response to chemotherapy. A pleomorphic mantle cell lymphoma is less likely since the cells are negative for Cyclin D1 and SOX11. FISH studies are pending and will be reported as an addendum.     09/30/2015 Imaging    CT scan of neck showed bulky RIGHT neck lymphadenopathy concerning for metastatic disease. 8 mm submucosal RIGHT base of tongue mass, considering ipsilateral lymphadenopathy, this is suspicious for primary head and neck cancer. Isodense LEFT palatine tonsillar mass versus focal lymphoid hyperplasia resulting in LEFT postobstructive middle ear/mastoid effusion.     10/03/2015 Imaging    ECHO showed normal EF    10/08/2015 PET scan    Hypermetabolic left nasopharyngeal and tongue base masses with hypermetabolic bilateral neck adenopathy. Right level 2 adenopathy extends inferiorly into the low right internal jugular station. No additional evidence of metastatic disease in the chest, abdomen or pelvis.    10/09/2015 Procedure    Successful placement of a left internal jugular approach power injectable Port-A-Cath. The catheter is ready for immediate use.    10/13/2015 - 11/24/2015 Chemotherapy    She received 3 cycles of chemotherapy with R-CHOP    12/15/2015 PET scan    Interval complete resolution of hypermetabolic activity associated with the left tonsillar mass and multiple cervical lymph nodes consistent with complete remission. No residual hypermetabolic activity demonstrated.    01/01/2016 - 01/27/2016 Radiation Therapy    Received IMRT to oropharynx / 30.6 Gy in 17  fractions to involved sites of Head and Neck.      05/13/2016 PET scan    1.  No recurrent abnormal hypermetabolic activity or adenopathy to suggest recurrent active lymphoma. 2. 2 by 3 mm left lower lobe pulmonary nodule, no change from earliest available comparison of 10/25/2015, not visibly hypermetabolic but below sensitive PET-CT size thresholds, very likely to be benign but merits surveillance on any follow up. 3. Tiny focus of hypermetabolic activity along the cutaneous surface of the right perineum without CT correlate, likely from contamination from minimal urinary incontinence. 4. Aortic Atherosclerosis (ICD10-I70.0).    05/31/2016 Procedure    Technically successful tunneled Port catheter removal.     REVIEW OF SYSTEMS:   Constitutional: Denies fevers, chills or abnormal weight loss Eyes: Denies blurriness of vision Ears, nose, mouth, throat, and face: Denies mucositis or sore throat Respiratory: Denies cough, dyspnea or wheezes Cardiovascular: Denies palpitation, chest discomfort or lower extremity swelling Gastrointestinal:  Denies nausea, heartburn or change in bowel habits Skin: Denies abnormal skin rashes Lymphatics: Denies new lymphadenopathy or easy bruising Neurological:Denies numbness, tingling or new weaknesses Behavioral/Psych: Mood is stable, no new changes  All other systems were reviewed with the patient and are negative.  I have reviewed the past medical history, past surgical history, social history and family history with the patient and they are unchanged from previous note.  ALLERGIES:  is allergic to latex; penicillins; and sulfa antibiotics.  MEDICATIONS:  Current Outpatient Medications  Medication Sig Dispense Refill  . Ascorbic Acid (VITAMIN C PO) Take 1 tablet by mouth daily.    . Cholecalciferol (VITAMIN D PO) Take 1 tablet by mouth daily.     No current facility-administered medications for this visit.     PHYSICAL EXAMINATION: ECOG PERFORMANCE STATUS: 0 - Asymptomatic  Vitals:   05/23/18 0946  BP: 119/82  Pulse: 60   Resp: 18  Temp: 97.9 F (36.6 C)  SpO2: 100%   Filed Weights   05/23/18 0946  Weight: 219 lb 4.8 oz (99.5 kg)    GENERAL:alert, no distress and comfortable SKIN: skin color, texture, turgor are normal, no rashes or significant lesions EYES: normal, Conjunctiva are pink and non-injected, sclera clear OROPHARYNX:no exudate, no erythema and lips, buccal mucosa, and tongue normal  NECK: supple, thyroid normal size, non-tender, without nodularity LYMPH:  no palpable lymphadenopathy in the cervical, axillary or inguinal LUNGS: clear to auscultation and percussion with normal breathing effort HEART: regular rate & rhythm and no murmurs and no lower extremity edema ABDOMEN:abdomen soft, non-tender and normal bowel sounds Musculoskeletal:no cyanosis of digits and no clubbing  NEURO: alert & oriented x 3 with fluent speech, no focal motor/sensory deficits  LABORATORY DATA:  I have reviewed the data as listed    Component Value Date/Time   NA 140 05/23/2018 0854   NA 138 08/13/2016 0955   K 4.2 05/23/2018 0854   K 4.1 08/13/2016 0955   CL 104 05/23/2018 0854   CO2 29 05/23/2018 0854   CO2 27 08/13/2016 0955   GLUCOSE 75 05/23/2018 0854   GLUCOSE 97 08/13/2016 0955   BUN 15 05/23/2018 0854   BUN 14.9 08/13/2016 0955   CREATININE 0.78 05/23/2018 0854   CREATININE 0.8 08/13/2016 0955   CALCIUM 8.6 (L) 05/23/2018 0854   CALCIUM 9.2 08/13/2016 0955   PROT 7.2 05/23/2018 0854   PROT 6.9 08/13/2016 0955   ALBUMIN 3.5 05/23/2018 0854   ALBUMIN 3.4 (L) 08/13/2016 0955   AST 18 05/23/2018 0854  AST 18 08/13/2016 0955   ALT 20 05/23/2018 0854   ALT 19 08/13/2016 0955   ALKPHOS 67 05/23/2018 0854   ALKPHOS 69 08/13/2016 0955   BILITOT 0.3 05/23/2018 0854   BILITOT 0.41 08/13/2016 0955   GFRNONAA >60 05/23/2018 0854   GFRAA >60 05/23/2018 0854    No results found for: SPEP, UPEP  Lab Results  Component Value Date   WBC 5.1 05/23/2018   NEUTROABS 2.3 05/23/2018   HGB 12.1  05/23/2018   HCT 38.7 05/23/2018   MCV 90.8 05/23/2018   PLT 245 05/23/2018      Chemistry      Component Value Date/Time   NA 140 05/23/2018 0854   NA 138 08/13/2016 0955   K 4.2 05/23/2018 0854   K 4.1 08/13/2016 0955   CL 104 05/23/2018 0854   CO2 29 05/23/2018 0854   CO2 27 08/13/2016 0955   BUN 15 05/23/2018 0854   BUN 14.9 08/13/2016 0955   CREATININE 0.78 05/23/2018 0854   CREATININE 0.8 08/13/2016 0955      Component Value Date/Time   CALCIUM 8.6 (L) 05/23/2018 0854   CALCIUM 9.2 08/13/2016 0955   ALKPHOS 67 05/23/2018 0854   ALKPHOS 69 08/13/2016 0955   AST 18 05/23/2018 0854   AST 18 08/13/2016 0955   ALT 20 05/23/2018 0854   ALT 19 08/13/2016 0955   BILITOT 0.3 05/23/2018 0854   BILITOT 0.41 08/13/2016 0955       All questions were answered. The patient knows to call the clinic with any problems, questions or concerns. No barriers to learning was detected.  I spent 10 minutes counseling the patient face to face. The total time spent in the appointment was 15 minutes and more than 50% was on counseling and review of test results  Heath Lark, MD 05/23/2018 10:38 AM

## 2018-05-23 NOTE — Assessment & Plan Note (Signed)
We discussed dietary modification and weight loss strategy

## 2018-07-03 ENCOUNTER — Other Ambulatory Visit: Payer: Self-pay | Admitting: Internal Medicine

## 2018-07-03 DIAGNOSIS — Z1231 Encounter for screening mammogram for malignant neoplasm of breast: Secondary | ICD-10-CM

## 2018-08-18 ENCOUNTER — Ambulatory Visit
Admission: RE | Admit: 2018-08-18 | Discharge: 2018-08-18 | Disposition: A | Discharge status: Home / Self Care | Payer: POS | Source: Ambulatory Visit | Attending: Internal Medicine | Admitting: Internal Medicine

## 2018-08-18 ENCOUNTER — Other Ambulatory Visit: Payer: Self-pay

## 2018-08-18 DIAGNOSIS — Z1231 Encounter for screening mammogram for malignant neoplasm of breast: Secondary | ICD-10-CM

## 2018-11-14 ENCOUNTER — Other Ambulatory Visit: Payer: Self-pay | Admitting: Hematology and Oncology

## 2018-11-14 DIAGNOSIS — C8331 Diffuse large B-cell lymphoma, lymph nodes of head, face, and neck: Secondary | ICD-10-CM

## 2018-11-21 ENCOUNTER — Encounter: Payer: Self-pay | Admitting: Hematology and Oncology

## 2018-11-21 ENCOUNTER — Inpatient Hospital Stay: Payer: POS | Attending: Hematology and Oncology

## 2018-11-21 ENCOUNTER — Other Ambulatory Visit: Payer: Self-pay

## 2018-11-21 ENCOUNTER — Inpatient Hospital Stay: Payer: POS | Admitting: Hematology and Oncology

## 2018-11-21 DIAGNOSIS — Z8572 Personal history of non-Hodgkin lymphomas: Secondary | ICD-10-CM | POA: Insufficient documentation

## 2018-11-21 DIAGNOSIS — C8331 Diffuse large B-cell lymphoma, lymph nodes of head, face, and neck: Secondary | ICD-10-CM

## 2018-11-21 DIAGNOSIS — I700 Atherosclerosis of aorta: Secondary | ICD-10-CM | POA: Diagnosis not present

## 2018-11-21 DIAGNOSIS — Z79899 Other long term (current) drug therapy: Secondary | ICD-10-CM | POA: Diagnosis not present

## 2018-11-21 DIAGNOSIS — Z9221 Personal history of antineoplastic chemotherapy: Secondary | ICD-10-CM | POA: Diagnosis not present

## 2018-11-21 DIAGNOSIS — R635 Abnormal weight gain: Secondary | ICD-10-CM | POA: Insufficient documentation

## 2018-11-21 DIAGNOSIS — Z923 Personal history of irradiation: Secondary | ICD-10-CM | POA: Diagnosis not present

## 2018-11-21 LAB — CBC WITH DIFFERENTIAL/PLATELET
Abs Immature Granulocytes: 0.01 10*3/uL (ref 0.00–0.07)
Basophils Absolute: 0 10*3/uL (ref 0.0–0.1)
Basophils Relative: 0 %
Eosinophils Absolute: 0.1 10*3/uL (ref 0.0–0.5)
Eosinophils Relative: 3 %
HCT: 37 % (ref 36.0–46.0)
Hemoglobin: 11.9 g/dL — ABNORMAL LOW (ref 12.0–15.0)
Immature Granulocytes: 0 %
Lymphocytes Relative: 35 %
Lymphs Abs: 1.8 10*3/uL (ref 0.7–4.0)
MCH: 28.1 pg (ref 26.0–34.0)
MCHC: 32.2 g/dL (ref 30.0–36.0)
MCV: 87.3 fL (ref 80.0–100.0)
Monocytes Absolute: 0.5 10*3/uL (ref 0.1–1.0)
Monocytes Relative: 10 %
Neutro Abs: 2.7 10*3/uL (ref 1.7–7.7)
Neutrophils Relative %: 52 %
Platelets: 217 10*3/uL (ref 150–400)
RBC: 4.24 MIL/uL (ref 3.87–5.11)
RDW: 13.2 % (ref 11.5–15.5)
WBC: 5.1 10*3/uL (ref 4.0–10.5)
nRBC: 0 % (ref 0.0–0.2)

## 2018-11-21 LAB — COMPREHENSIVE METABOLIC PANEL
ALT: 22 U/L (ref 0–44)
AST: 19 U/L (ref 15–41)
Albumin: 3.4 g/dL — ABNORMAL LOW (ref 3.5–5.0)
Alkaline Phosphatase: 67 U/L (ref 38–126)
Anion gap: 9 (ref 5–15)
BUN: 12 mg/dL (ref 6–20)
CO2: 27 mmol/L (ref 22–32)
Calcium: 8.9 mg/dL (ref 8.9–10.3)
Chloride: 104 mmol/L (ref 98–111)
Creatinine, Ser: 0.93 mg/dL (ref 0.44–1.00)
GFR calc Af Amer: >60 mL/min (ref >60)
GFR calc non Af Amer: >60 mL/min (ref >60)
Glucose, Bld: 84 mg/dL (ref 70–99)
Potassium: 3.9 mmol/L (ref 3.5–5.1)
Sodium: 140 mmol/L (ref 135–145)
Total Bilirubin: 0.4 mg/dL (ref 0.3–1.2)
Total Protein: 6.8 g/dL (ref 6.5–8.1)

## 2018-11-21 NOTE — Assessment & Plan Note (Signed)
The patient has no clinical signs or symptoms to suggest disease recurrence I recommend follow-up without surveillance imaging I will see her on a yearly basis for further follow-up She is up-to-date with her influenza vaccination 

## 2018-11-21 NOTE — Assessment & Plan Note (Signed)
She continues to have excessive weight gain I wonder if she could have developed acquired hypothyroidism secondary to radiation exposure I recommend she follows with her primary care doctor and to consider her thyroid function checked

## 2018-11-21 NOTE — Progress Notes (Signed)
[place] OFFICE PROGRESS NOTE  Patient Care Team: Tisovec, Fransico Him, MD as PCP - General (Internal Medicine) Heath Lark, MD as Consulting Physician (Hematology and Oncology) Jodi Marble, MD as Consulting Physician (Otolaryngology) Eppie Gibson, MD as Attending Physician (Radiation Oncology)  ASSESSMENT & PLAN:  Diffuse large B-cell lymphoma of lymph nodes of neck [facility]) The patient has no clinical signs or symptoms to suggest disease recurrence I recommend follow-up without surveillance imaging I will see her on a yearly basis for further follow-up She is up-to-date with her influenza vaccination  Weight gain She continues to have excessive weight gain I wonder if she could have developed acquired hypothyroidism secondary to radiation exposure I recommend she follows with her primary care doctor and to consider her thyroid function checked   No orders of the defined types were placed in this encounter.   INTERVAL HISTORY: Please see below for problem oriented charting. She returns for further follow-up She is doing well She continues to have excessive weight gain but she attributed that to lack of exercise and poor dietary choices She denies new lymphadenopathy Denies abnormal night sweats  SUMMARY OF ONCOLOGIC HISTORY: Oncology History  Diffuse large B-cell lymphoma of lymph nodes of neck ([place])  09/24/2015 Pathology Results   Final Cytologic Interpretation [id number]: Right level II neck mass, Fine Needle Aspiration I (smears and Thinprep): Malignant cells present, most consistent with large cell lymphoma.    09/24/2015 Pathology Results   [phone] The nasopharyngeal biopsy shows lymphoid tissue with diffuse effacement of the normal nodal architecture by large cells with vesicular nuclei, prominent nucleoli, and scant cytoplasm. The large cells are positive for CD20, BCL-6, BCL-2, CD5, and MUM1 by immunohistochemistry. They are negative for CD30, CD3,  CD10, cyclinD1 and SOX11. In situ hybridization for EBV mRNA is negative. Ki-67 reveals a proliferation rate of approximately 60%. By flow cytometry, the atypical cells express CD19, CD5, and CD23 (see below). The expression of CD5 and CD23 by flow cytometry is suggestive of small lymphocytic lymphoma/ chronic lymphocytic leukemia (SLL/CLL) transforming to a diffuse large B-cell lymphoma (DLBCL); however, there was no evidence of SLL/CLL in the flow sample. Correlation with peripheral blood/ marrow findings would be useful to determine if this lymphoma arose from background SLL/CLL or is a de novo CD5+ DLBCL. De novo CD5+ DLBCL is frequently associated with an aggressive clinical course and poor response to chemotherapy. A pleomorphic mantle cell lymphoma is less likely since the cells are negative for Cyclin D1 and SOX11. FISH studies are pending and will be reported as an addendum.    09/30/2015 Imaging   CT scan of neck showed bulky RIGHT neck lymphadenopathy concerning for metastatic disease. 8 mm submucosal RIGHT base of tongue mass, considering ipsilateral lymphadenopathy, this is suspicious for primary head and neck cancer. Isodense LEFT palatine tonsillar mass versus focal lymphoid hyperplasia resulting in LEFT postobstructive middle ear/mastoid effusion.    10/03/2015 Imaging   ECHO showed normal EF   10/08/2015 PET scan   Hypermetabolic left nasopharyngeal and tongue base masses with hypermetabolic bilateral neck adenopathy. Right level 2 adenopathy extends inferiorly into the low right internal jugular station. No additional evidence of metastatic disease in the chest, abdomen or pelvis.   10/09/2015 Procedure   Successful placement of a left internal jugular approach power injectable Port-A-Cath. The catheter is ready for immediate use.   10/13/2015 - 11/24/2015 Chemotherapy   She received 3 cycles of chemotherapy with R-CHOP   12/15/2015 PET scan   Interval  complete resolution of  hypermetabolic activity associated with the left tonsillar mass and multiple cervical lymph nodes consistent with complete remission. No residual hypermetabolic activity demonstrated.   01/01/2016 - 01/27/2016 Radiation Therapy   Received IMRT to oropharynx / 30.6 Gy in 17 fractions to involved sites of Head and Neck.     05/13/2016 PET scan   1. No recurrent abnormal hypermetabolic activity or adenopathy to suggest recurrent active lymphoma. 2. 2 by 3 mm left lower lobe pulmonary nodule, no change from earliest available comparison of 10/25/2015, not visibly hypermetabolic but below sensitive PET-CT size thresholds, very likely to be benign but merits surveillance on any follow up. 3. Tiny focus of hypermetabolic activity along the cutaneous surface of the right perineum without CT correlate, likely from contamination from minimal urinary incontinence. 4. Aortic Atherosclerosis (ICD10-I70.0).   05/31/2016 Procedure   Technically successful tunneled Port catheter removal.     REVIEW OF SYSTEMS:   Constitutional: Denies fevers, chills or abnormal weight loss Eyes: Denies blurriness of vision Ears, nose, mouth, throat, and face: Denies mucositis or sore throat Respiratory: Denies cough, dyspnea or wheezes Cardiovascular: Denies palpitation, chest discomfort or lower extremity swelling Gastrointestinal:  Denies nausea, heartburn or change in bowel habits Skin: Denies abnormal skin rashes Lymphatics: Denies new lymphadenopathy or easy bruising Neurological:Denies numbness, tingling or new weaknesses Behavioral/Psych: Mood is stable, no new changes  All other systems were reviewed with the patient and are negative.  I have reviewed the past medical history, past surgical history, social history and family history with the patient and they are unchanged from previous note.  ALLERGIES:  is allergic to latex; penicillins; and sulfa antibiotics.  MEDICATIONS:  Current Outpatient Medications   Medication Sig Dispense Refill  . Ascorbic Acid (VITAMIN C PO) Take 1 tablet by mouth daily.    . Cholecalciferol (VITAMIN D PO) Take 1 tablet by mouth daily.     No current facility-administered medications for this visit.     PHYSICAL EXAMINATION: ECOG PERFORMANCE STATUS: 0 - Asymptomatic  Vitals:   11/21/18 0900  BP: 132/88  Pulse: 73  Resp: 18  Temp: 98.2 F (36.8 C)  SpO2: 99%   Filed Weights   11/21/18 0900  Weight: 229 lb 8 oz (104.1 kg)    GENERAL:alert, no distress and comfortable SKIN: skin color, texture, turgor are normal, no rashes or significant lesions EYES: normal, Conjunctiva are pink and non-injected, sclera clear OROPHARYNX:no exudate, no erythema and lips, buccal mucosa, and tongue normal  NECK: supple, thyroid normal size, non-tender, without nodularity LYMPH:  no palpable lymphadenopathy in the cervical, axillary or inguinal LUNGS: clear to auscultation and percussion with normal breathing effort HEART: regular rate & rhythm and no murmurs and no lower extremity edema ABDOMEN:abdomen soft, non-tender and normal bowel sounds Musculoskeletal:no cyanosis of digits and no clubbing  NEURO: alert & oriented x 3 with fluent speech, no focal motor/sensory deficits  LABORATORY DATA:  I have reviewed the data as listed    Component Value Date/Time   NA 140 11/21/2018 0834   NA 138 08/13/2016 0955   K 3.9 11/21/2018 0834   K 4.1 08/13/2016 0955   CL 104 11/21/2018 0834   CO2 27 11/21/2018 0834   CO2 27 08/13/2016 0955   GLUCOSE 84 11/21/2018 0834   GLUCOSE 97 08/13/2016 0955   BUN 12 11/21/2018 0834   BUN 14.9 08/13/2016 0955   CREATININE 0.93 11/21/2018 0834   CREATININE 0.8 08/13/2016 0955   CALCIUM 8.9 11/21/2018 0834  CALCIUM 9.2 08/13/2016 0955   PROT 6.8 11/21/2018 0834   PROT 6.9 08/13/2016 0955   ALBUMIN 3.4 (L) 11/21/2018 0834   ALBUMIN 3.4 (L) 08/13/2016 0955   AST 19 11/21/2018 0834   AST 18 08/13/2016 0955   ALT 22 11/21/2018 0834    ALT 19 08/13/2016 0955   ALKPHOS 67 11/21/2018 0834   ALKPHOS 69 08/13/2016 0955   BILITOT 0.4 11/21/2018 0834   BILITOT 0.41 08/13/2016 0955   GFRNONAA >60 11/21/2018 0834   GFRAA >60 11/21/2018 0834    No results found for: SPEP, UPEP  Lab Results  Component Value Date   WBC 5.1 11/21/2018   NEUTROABS 2.7 11/21/2018   HGB 11.9 (L) 11/21/2018   HCT 37.0 11/21/2018   MCV 87.3 11/21/2018   PLT 217 11/21/2018      Chemistry      Component Value Date/Time   NA 140 11/21/2018 0834   NA 138 08/13/2016 0955   K 3.9 11/21/2018 0834   K 4.1 08/13/2016 0955   CL 104 11/21/2018 0834   CO2 27 11/21/2018 0834   CO2 27 08/13/2016 0955   BUN 12 11/21/2018 0834   BUN 14.9 08/13/2016 0955   CREATININE 0.93 11/21/2018 0834   CREATININE 0.8 08/13/2016 0955      Component Value Date/Time   CALCIUM 8.9 11/21/2018 0834   CALCIUM 9.2 08/13/2016 0955   ALKPHOS 67 11/21/2018 0834   ALKPHOS 69 08/13/2016 0955   AST 19 11/21/2018 0834   AST 18 08/13/2016 0955   ALT 22 11/21/2018 0834   ALT 19 08/13/2016 0955   BILITOT 0.4 11/21/2018 0834   BILITOT 0.41 08/13/2016 0955      All questions were answered. The patient knows to call the clinic with any problems, questions or concerns. No barriers to learning was detected.  I spent 10 minutes counseling the patient face to face. The total time spent in the appointment was 15 minutes and more than 50% was on counseling and review of test results  Heath Lark, MD 11/21/2018 4:18 PM

## 2018-11-22 ENCOUNTER — Telehealth: Payer: Self-pay | Admitting: Hematology and Oncology

## 2018-11-22 NOTE — Telephone Encounter (Signed)
Scheduled appt per 10/27 sch message - mailed reminder letter with appt date and time   

## 2019-05-07 DIAGNOSIS — B0052 Herpesviral keratitis: Secondary | ICD-10-CM | POA: Diagnosis not present

## 2019-05-08 DIAGNOSIS — B0052 Herpesviral keratitis: Secondary | ICD-10-CM | POA: Diagnosis not present

## 2019-05-09 DIAGNOSIS — Z0000 Encounter for general adult medical examination without abnormal findings: Secondary | ICD-10-CM | POA: Diagnosis not present

## 2019-05-09 DIAGNOSIS — E7800 Pure hypercholesterolemia, unspecified: Secondary | ICD-10-CM | POA: Diagnosis not present

## 2019-05-16 DIAGNOSIS — R739 Hyperglycemia, unspecified: Secondary | ICD-10-CM | POA: Diagnosis not present

## 2019-05-16 DIAGNOSIS — Z0000 Encounter for general adult medical examination without abnormal findings: Secondary | ICD-10-CM | POA: Diagnosis not present

## 2019-05-16 DIAGNOSIS — Z1212 Encounter for screening for malignant neoplasm of rectum: Secondary | ICD-10-CM | POA: Diagnosis not present

## 2019-05-16 DIAGNOSIS — L309 Dermatitis, unspecified: Secondary | ICD-10-CM | POA: Diagnosis not present

## 2019-05-16 DIAGNOSIS — M130 Polyarthritis, unspecified: Secondary | ICD-10-CM | POA: Diagnosis not present

## 2019-05-16 DIAGNOSIS — R82998 Other abnormal findings in urine: Secondary | ICD-10-CM | POA: Diagnosis not present

## 2019-07-11 ENCOUNTER — Other Ambulatory Visit: Payer: Self-pay | Admitting: Internal Medicine

## 2019-07-11 DIAGNOSIS — Z1231 Encounter for screening mammogram for malignant neoplasm of breast: Secondary | ICD-10-CM

## 2019-08-28 ENCOUNTER — Ambulatory Visit
Admission: RE | Admit: 2019-08-28 | Discharge: 2019-08-28 | Disposition: A | Discharge status: Home / Self Care | Payer: POS | Source: Ambulatory Visit | Attending: Internal Medicine | Admitting: Internal Medicine

## 2019-08-28 ENCOUNTER — Other Ambulatory Visit: Payer: Self-pay

## 2019-08-28 DIAGNOSIS — Z1231 Encounter for screening mammogram for malignant neoplasm of breast: Secondary | ICD-10-CM | POA: Diagnosis not present

## 2019-11-01 DIAGNOSIS — Z6841 Body Mass Index (BMI) 40.0 and over, adult: Secondary | ICD-10-CM | POA: Diagnosis not present

## 2019-11-01 DIAGNOSIS — Z01419 Encounter for gynecological examination (general) (routine) without abnormal findings: Secondary | ICD-10-CM | POA: Diagnosis not present

## 2019-11-01 DIAGNOSIS — Z124 Encounter for screening for malignant neoplasm of cervix: Secondary | ICD-10-CM | POA: Diagnosis not present

## 2019-11-10 DIAGNOSIS — Z23 Encounter for immunization: Secondary | ICD-10-CM | POA: Diagnosis not present

## 2019-11-15 ENCOUNTER — Other Ambulatory Visit: Payer: Self-pay | Admitting: Hematology and Oncology

## 2019-11-15 DIAGNOSIS — C8331 Diffuse large B-cell lymphoma, lymph nodes of head, face, and neck: Secondary | ICD-10-CM

## 2019-11-15 DIAGNOSIS — R739 Hyperglycemia, unspecified: Secondary | ICD-10-CM | POA: Diagnosis not present

## 2019-11-20 ENCOUNTER — Other Ambulatory Visit: Payer: Self-pay

## 2019-11-20 ENCOUNTER — Encounter: Payer: Self-pay | Admitting: Hematology and Oncology

## 2019-11-20 ENCOUNTER — Inpatient Hospital Stay: Payer: Federal, State, Local not specified - PPO

## 2019-11-20 ENCOUNTER — Inpatient Hospital Stay
Payer: Federal, State, Local not specified - PPO | Attending: Hematology and Oncology | Admitting: Hematology and Oncology

## 2019-11-20 VITALS — BP 137/75 | HR 65 | Temp 98.9°F | Resp 18 | Ht 64.0 in | Wt 237.0 lb

## 2019-11-20 DIAGNOSIS — C8331 Diffuse large B-cell lymphoma, lymph nodes of head, face, and neck: Secondary | ICD-10-CM | POA: Insufficient documentation

## 2019-11-20 DIAGNOSIS — Z923 Personal history of irradiation: Secondary | ICD-10-CM | POA: Diagnosis not present

## 2019-11-20 DIAGNOSIS — I700 Atherosclerosis of aorta: Secondary | ICD-10-CM | POA: Diagnosis not present

## 2019-11-20 DIAGNOSIS — Z9221 Personal history of antineoplastic chemotherapy: Secondary | ICD-10-CM | POA: Insufficient documentation

## 2019-11-20 DIAGNOSIS — E669 Obesity, unspecified: Secondary | ICD-10-CM | POA: Insufficient documentation

## 2019-11-20 LAB — CBC WITH DIFFERENTIAL/PLATELET
Abs Immature Granulocytes: 0.02 10*3/uL (ref 0.00–0.07)
Basophils Absolute: 0 10*3/uL (ref 0.0–0.1)
Basophils Relative: 0 %
Eosinophils Absolute: 0.1 10*3/uL (ref 0.0–0.5)
Eosinophils Relative: 2 %
HCT: 36.8 % (ref 36.0–46.0)
Hemoglobin: 11.9 g/dL — ABNORMAL LOW (ref 12.0–15.0)
Immature Granulocytes: 0 %
Lymphocytes Relative: 38 %
Lymphs Abs: 2.2 10*3/uL (ref 0.7–4.0)
MCH: 28.6 pg (ref 26.0–34.0)
MCHC: 32.3 g/dL (ref 30.0–36.0)
MCV: 88.5 fL (ref 80.0–100.0)
Monocytes Absolute: 0.5 10*3/uL (ref 0.1–1.0)
Monocytes Relative: 8 %
Neutro Abs: 3 10*3/uL (ref 1.7–7.7)
Neutrophils Relative %: 52 %
Platelets: 254 10*3/uL (ref 150–400)
RBC: 4.16 MIL/uL (ref 3.87–5.11)
RDW: 13.8 % (ref 11.5–15.5)
WBC: 5.8 10*3/uL (ref 4.0–10.5)
nRBC: 0 % (ref 0.0–0.2)

## 2019-11-20 LAB — COMPREHENSIVE METABOLIC PANEL
ALT: 26 U/L (ref 0–44)
AST: 21 U/L (ref 15–41)
Albumin: 3.6 g/dL (ref 3.5–5.0)
Alkaline Phosphatase: 67 U/L (ref 38–126)
Anion gap: 8 (ref 5–15)
BUN: 13 mg/dL (ref 6–20)
CO2: 29 mmol/L (ref 22–32)
Calcium: 9.1 mg/dL (ref 8.9–10.3)
Chloride: 102 mmol/L (ref 98–111)
Creatinine, Ser: 0.74 mg/dL (ref 0.44–1.00)
GFR, Estimated: >60 mL/min (ref >60)
Glucose, Bld: 86 mg/dL (ref 70–99)
Potassium: 3.8 mmol/L (ref 3.5–5.1)
Sodium: 139 mmol/L (ref 135–145)
Total Bilirubin: 0.2 mg/dL — ABNORMAL LOW (ref 0.3–1.2)
Total Protein: 7 g/dL (ref 6.5–8.1)

## 2019-11-20 NOTE — Progress Notes (Signed)
[facility] OFFICE PROGRESS NOTE  Patient Care Team: Tisovec, Fransico Him, MD as PCP - General (Internal Medicine) Heath Lark, MD as Consulting Physician (Hematology and Oncology) Jodi Marble, MD as Consulting Physician (Otolaryngology) Eppie Gibson, MD as Attending Physician (Radiation Oncology)  ASSESSMENT & PLAN:  Diffuse large B-cell lymphoma of lymph nodes of neck [facility]) The patient has no clinical signs or symptoms to suggest disease recurrence I recommend follow-up without surveillance imaging I will see her on a yearly basis for further follow-up She is up-to-date with her influenza vaccination  Obesity, Class III, BMI 40-49.9 (morbid obesity) ([place]) She is noted to have progressive weight gain We discussed the importance of weight loss and the risk of lymphoma recurrence with obesity She will try to sign up for weight loss program She declined help or referral at this point   Orders Placed This Encounter  Procedures   CBC with Differential/Platelet    Standing Status:   Standing    Number of Occurrences:   22    Standing Expiration Date:   11/19/2020   Comprehensive metabolic panel    Standing Status:   Standing    Number of Occurrences:   33    Standing Expiration Date:   11/19/2020    All questions were answered. The patient knows to call the clinic with any problems, questions or concerns. The total time spent in the appointment was 20 minutes encounter with patients including review of chart and various tests results, discussions about plan of care and coordination of care plan   Heath Lark, MD 11/20/2019 4:25 PM  INTERVAL HISTORY: Please see below for problem oriented charting. She returns for lymphoma follow-up Since last time I saw her, she feels well No new lymphadenopathy No recent infection, fever or chills She is up-to-date with her vaccination program Unfortunately, she continues to gain weight She has tried many different ways to  lose weight but was unsuccessful  SUMMARY OF ONCOLOGIC HISTORY: Oncology History  Diffuse large B-cell lymphoma of lymph nodes of neck ([place])  09/24/2015 Pathology Results   Final Cytologic Interpretation [id number]: Right level II neck mass, Fine Needle Aspiration I (smears and Thinprep): Malignant cells present, most consistent with large cell lymphoma.    09/24/2015 Pathology Results   [phone] The nasopharyngeal biopsy shows lymphoid tissue with diffuse effacement of the normal nodal architecture by large cells with vesicular nuclei, prominent nucleoli, and scant cytoplasm. The large cells are positive for CD20, BCL-6, BCL-2, CD5, and MUM1 by immunohistochemistry. They are negative for CD30, CD3, CD10, cyclinD1 and SOX11. In situ hybridization for EBV mRNA is negative. Ki-67 reveals a proliferation rate of approximately 60%. By flow cytometry, the atypical cells express CD19, CD5, and CD23 (see below). The expression of CD5 and CD23 by flow cytometry is suggestive of small lymphocytic lymphoma/ chronic lymphocytic leukemia (SLL/CLL) transforming to a diffuse large B-cell lymphoma (DLBCL); however, there was no evidence of SLL/CLL in the flow sample. Correlation with peripheral blood/ marrow findings would be useful to determine if this lymphoma arose from background SLL/CLL or is a de novo CD5+ DLBCL. De novo CD5+ DLBCL is frequently associated with an aggressive clinical course and poor response to chemotherapy. A pleomorphic mantle cell lymphoma is less likely since the cells are negative for Cyclin D1 and SOX11. FISH studies are pending and will be reported as an addendum.    09/30/2015 Imaging   CT scan of neck showed bulky RIGHT neck lymphadenopathy concerning for metastatic disease. 8  mm submucosal RIGHT base of tongue mass, considering ipsilateral lymphadenopathy, this is suspicious for primary head and neck cancer. Isodense LEFT palatine tonsillar mass versus focal lymphoid hyperplasia  resulting in LEFT postobstructive middle ear/mastoid effusion.    10/03/2015 Imaging   ECHO showed normal EF   10/08/2015 PET scan   Hypermetabolic left nasopharyngeal and tongue base masses with hypermetabolic bilateral neck adenopathy. Right level 2 adenopathy extends inferiorly into the low right internal jugular station. No additional evidence of metastatic disease in the chest, abdomen or pelvis.   10/09/2015 Procedure   Successful placement of a left internal jugular approach power injectable Port-A-Cath. The catheter is ready for immediate use.   10/13/2015 - 11/24/2015 Chemotherapy   She received 3 cycles of chemotherapy with R-CHOP   12/15/2015 PET scan   Interval complete resolution of hypermetabolic activity associated with the left tonsillar mass and multiple cervical lymph nodes consistent with complete remission. No residual hypermetabolic activity demonstrated.   01/01/2016 - 01/27/2016 Radiation Therapy   Received IMRT to oropharynx / 30.6 Gy in 17 fractions to involved sites of Head and Neck.     05/13/2016 PET scan   1. No recurrent abnormal hypermetabolic activity or adenopathy to suggest recurrent active lymphoma. 2. 2 by 3 mm left lower lobe pulmonary nodule, no change from earliest available comparison of 10/25/2015, not visibly hypermetabolic but below sensitive PET-CT size thresholds, very likely to be benign but merits surveillance on any follow up. 3. Tiny focus of hypermetabolic activity along the cutaneous surface of the right perineum without CT correlate, likely from contamination from minimal urinary incontinence. 4. Aortic Atherosclerosis (ICD10-I70.0).   05/31/2016 Procedure   Technically successful tunneled Port catheter removal.     REVIEW OF SYSTEMS:   Constitutional: Denies fevers, chills or abnormal weight loss Eyes: Denies blurriness of vision Ears, nose, mouth, throat, and face: Denies mucositis or sore throat Respiratory: Denies cough, dyspnea  or wheezes Cardiovascular: Denies palpitation, chest discomfort or lower extremity swelling Gastrointestinal:  Denies nausea, heartburn or change in bowel habits Skin: Denies abnormal skin rashes Lymphatics: Denies new lymphadenopathy or easy bruising Neurological:Denies numbness, tingling or new weaknesses Behavioral/Psych: Mood is stable, no new changes  All other systems were reviewed with the patient and are negative.  I have reviewed the past medical history, past surgical history, social history and family history with the patient and they are unchanged from previous note.  ALLERGIES:  is allergic to latex, penicillins, and sulfa antibiotics.  MEDICATIONS:  Current Outpatient Medications  Medication Sig Dispense Refill   Multiple Vitamin (MULTIVITAMIN) tablet Take 1 tablet by mouth daily.     Ascorbic Acid (VITAMIN C PO) Take 1 tablet by mouth daily.     Cholecalciferol (VITAMIN D PO) Take 1 tablet by mouth daily.     No current facility-administered medications for this visit.    PHYSICAL EXAMINATION: ECOG PERFORMANCE STATUS: 0 - Asymptomatic  Vitals:   11/20/19 1310  BP: 137/75  Pulse: 65  Resp: 18  Temp: 98.9 F (37.2 C)  SpO2: 100%   Filed Weights   11/20/19 1310  Weight: 237 lb (107.5 kg)    GENERAL:alert, no distress and comfortable SKIN: skin color, texture, turgor are normal, no rashes or significant lesions EYES: normal, Conjunctiva are pink and non-injected, sclera clear OROPHARYNX:no exudate, no erythema and lips, buccal mucosa, and tongue normal  NECK: supple, thyroid normal size, non-tender, without nodularity LYMPH:  no palpable lymphadenopathy in the cervical, axillary or inguinal LUNGS: clear to auscultation  and percussion with normal breathing effort HEART: regular rate & rhythm and no murmurs and no lower extremity edema ABDOMEN:abdomen soft, non-tender and normal bowel sounds Musculoskeletal:no cyanosis of digits and no clubbing  NEURO:  alert & oriented x 3 with fluent speech, no focal motor/sensory deficits  LABORATORY DATA:  I have reviewed the data as listed    Component Value Date/Time   NA 139 11/20/2019 1224   NA 138 08/13/2016 0955   K 3.8 11/20/2019 1224   K 4.1 08/13/2016 0955   CL 102 11/20/2019 1224   CO2 29 11/20/2019 1224   CO2 27 08/13/2016 0955   GLUCOSE 86 11/20/2019 1224   GLUCOSE 97 08/13/2016 0955   BUN 13 11/20/2019 1224   BUN 14.9 08/13/2016 0955   CREATININE 0.74 11/20/2019 1224   CREATININE 0.8 08/13/2016 0955   CALCIUM 9.1 11/20/2019 1224   CALCIUM 9.2 08/13/2016 0955   PROT 7.0 11/20/2019 1224   PROT 6.9 08/13/2016 0955   ALBUMIN 3.6 11/20/2019 1224   ALBUMIN 3.4 (L) 08/13/2016 0955   AST 21 11/20/2019 1224   AST 18 08/13/2016 0955   ALT 26 11/20/2019 1224   ALT 19 08/13/2016 0955   ALKPHOS 67 11/20/2019 1224   ALKPHOS 69 08/13/2016 0955   BILITOT 0.2 (L) 11/20/2019 1224   BILITOT 0.41 08/13/2016 0955   GFRNONAA >60 11/20/2019 1224   GFRAA >60 11/21/2018 0834    No results found for: SPEP, UPEP  Lab Results  Component Value Date   WBC 5.8 11/20/2019   NEUTROABS 3.0 11/20/2019   HGB 11.9 (L) 11/20/2019   HCT 36.8 11/20/2019   MCV 88.5 11/20/2019   PLT 254 11/20/2019      Chemistry      Component Value Date/Time   NA 139 11/20/2019 1224   NA 138 08/13/2016 0955   K 3.8 11/20/2019 1224   K 4.1 08/13/2016 0955   CL 102 11/20/2019 1224   CO2 29 11/20/2019 1224   CO2 27 08/13/2016 0955   BUN 13 11/20/2019 1224   BUN 14.9 08/13/2016 0955   CREATININE 0.74 11/20/2019 1224   CREATININE 0.8 08/13/2016 0955      Component Value Date/Time   CALCIUM 9.1 11/20/2019 1224   CALCIUM 9.2 08/13/2016 0955   ALKPHOS 67 11/20/2019 1224   ALKPHOS 69 08/13/2016 0955   AST 21 11/20/2019 1224   AST 18 08/13/2016 0955   ALT 26 11/20/2019 1224   ALT 19 08/13/2016 0955   BILITOT 0.2 (L) 11/20/2019 1224   BILITOT 0.41 08/13/2016 0955

## 2019-11-20 NOTE — Assessment & Plan Note (Signed)
The patient has no clinical signs or symptoms to suggest disease recurrence I recommend follow-up without surveillance imaging I will see her on a yearly basis for further follow-up She is up-to-date with her influenza vaccination

## 2019-11-20 NOTE — Assessment & Plan Note (Signed)
She is noted to have progressive weight gain We discussed the importance of weight loss and the risk of lymphoma recurrence with obesity She will try to sign up for weight loss program She declined help or referral at this point

## 2019-11-22 ENCOUNTER — Telehealth: Payer: Self-pay | Admitting: Hematology and Oncology

## 2019-11-22 NOTE — Telephone Encounter (Signed)
Scheduled appt per 10/26 sch msg - mailed reminder letter with appt date and time

## 2020-02-28 ENCOUNTER — Other Ambulatory Visit: Payer: Self-pay

## 2020-02-28 ENCOUNTER — Encounter: Payer: Self-pay | Admitting: Podiatry

## 2020-02-28 ENCOUNTER — Ambulatory Visit (INDEPENDENT_AMBULATORY_CARE_PROVIDER_SITE_OTHER): Payer: Federal, State, Local not specified - PPO

## 2020-02-28 ENCOUNTER — Ambulatory Visit: Payer: Federal, State, Local not specified - PPO | Admitting: Podiatry

## 2020-02-28 DIAGNOSIS — M722 Plantar fascial fibromatosis: Secondary | ICD-10-CM | POA: Diagnosis not present

## 2020-02-28 DIAGNOSIS — M76822 Posterior tibial tendinitis, left leg: Secondary | ICD-10-CM | POA: Diagnosis not present

## 2020-02-28 MED ORDER — TRIAMCINOLONE ACETONIDE 10 MG/ML IJ SUSP
10.0000 mg | Freq: Once | INTRAMUSCULAR | Status: AC
  Administered 2020-02-28: 10 mg

## 2020-02-29 NOTE — Progress Notes (Signed)
Subjective:   Patient ID: Charlotte Davidson, female   DOB: 60 y.o.   MRN: [id number]   HPI Patient has had a reoccurrence of exquisite discomfort plantar heel left and states that her orthotics are worn down and not working for her properly   ROS      Objective:  Physical Exam  Neurovascular status intact with patient's left heel quite sore when pressed with discomfort still upon deep palpation but improved from previous      Assessment:  Acute plantar fasciitis left     Plan:  Reviewed her foot structure in the flattening of her arch and I went ahead today and discussed long-term improvement in orthotics and she is casted by ped orthotist for new orthotics.  I did do sterile prep and injected the plantar fascial left 3 mg Kenalog 5 mg Xylocaine and advised on the continuation of anti-inflammatory treatment.  Patient will be seen back when orthotics are ready or earlier if needed

## 2020-04-08 ENCOUNTER — Telehealth: Payer: Self-pay

## 2020-04-08 NOTE — Telephone Encounter (Signed)
Decision is up to her I typically do not have strong opinion to recommend that I heard it could be very painful but if her PCP recommend she can proceed

## 2020-04-08 NOTE — Telephone Encounter (Signed)
Called and left below message. Ask her to call the office back for questions. 

## 2020-04-08 NOTE — Telephone Encounter (Signed)
She called and left a message to call her.  Called back. She is asking if Dr. Alvy Bimler thinks she should get the shingles vaccine? She has never had one.

## 2020-05-15 DIAGNOSIS — R739 Hyperglycemia, unspecified: Secondary | ICD-10-CM | POA: Diagnosis not present

## 2020-05-15 DIAGNOSIS — E7800 Pure hypercholesterolemia, unspecified: Secondary | ICD-10-CM | POA: Diagnosis not present

## 2020-05-22 DIAGNOSIS — C8331 Diffuse large B-cell lymphoma, lymph nodes of head, face, and neck: Secondary | ICD-10-CM | POA: Diagnosis not present

## 2020-05-22 DIAGNOSIS — Z1339 Encounter for screening examination for other mental health and behavioral disorders: Secondary | ICD-10-CM | POA: Diagnosis not present

## 2020-05-22 DIAGNOSIS — Z1331 Encounter for screening for depression: Secondary | ICD-10-CM | POA: Diagnosis not present

## 2020-05-22 DIAGNOSIS — Z0000 Encounter for general adult medical examination without abnormal findings: Secondary | ICD-10-CM | POA: Diagnosis not present

## 2020-05-22 DIAGNOSIS — R82998 Other abnormal findings in urine: Secondary | ICD-10-CM | POA: Diagnosis not present

## 2020-05-26 DIAGNOSIS — Z20822 Contact with and (suspected) exposure to covid-19: Secondary | ICD-10-CM | POA: Diagnosis not present

## 2020-08-13 ENCOUNTER — Other Ambulatory Visit: Payer: Self-pay | Admitting: Internal Medicine

## 2020-08-13 DIAGNOSIS — Z1231 Encounter for screening mammogram for malignant neoplasm of breast: Secondary | ICD-10-CM

## 2020-08-28 ENCOUNTER — Other Ambulatory Visit: Payer: Self-pay

## 2020-08-28 ENCOUNTER — Ambulatory Visit
Admission: RE | Admit: 2020-08-28 | Discharge: 2020-08-28 | Disposition: A | Discharge status: Home / Self Care | Payer: Federal, State, Local not specified - PPO | Source: Ambulatory Visit | Attending: Internal Medicine | Admitting: Internal Medicine

## 2020-08-28 DIAGNOSIS — Z1231 Encounter for screening mammogram for malignant neoplasm of breast: Secondary | ICD-10-CM | POA: Diagnosis not present

## 2020-11-18 ENCOUNTER — Inpatient Hospital Stay: Payer: Federal, State, Local not specified - PPO

## 2020-11-18 ENCOUNTER — Inpatient Hospital Stay
Payer: Federal, State, Local not specified - PPO | Attending: Hematology and Oncology | Admitting: Hematology and Oncology

## 2020-11-18 ENCOUNTER — Other Ambulatory Visit: Payer: Self-pay

## 2020-11-18 DIAGNOSIS — I700 Atherosclerosis of aorta: Secondary | ICD-10-CM | POA: Diagnosis not present

## 2020-11-18 DIAGNOSIS — Z9221 Personal history of antineoplastic chemotherapy: Secondary | ICD-10-CM | POA: Diagnosis not present

## 2020-11-18 DIAGNOSIS — F419 Anxiety disorder, unspecified: Secondary | ICD-10-CM | POA: Insufficient documentation

## 2020-11-18 DIAGNOSIS — E669 Obesity, unspecified: Secondary | ICD-10-CM | POA: Insufficient documentation

## 2020-11-18 DIAGNOSIS — C8331 Diffuse large B-cell lymphoma, lymph nodes of head, face, and neck: Secondary | ICD-10-CM

## 2020-11-18 DIAGNOSIS — I10 Essential (primary) hypertension: Secondary | ICD-10-CM | POA: Insufficient documentation

## 2020-11-18 DIAGNOSIS — Z923 Personal history of irradiation: Secondary | ICD-10-CM | POA: Insufficient documentation

## 2020-11-18 DIAGNOSIS — C8338 Diffuse large B-cell lymphoma, lymph nodes of multiple sites: Secondary | ICD-10-CM | POA: Insufficient documentation

## 2020-11-18 DIAGNOSIS — R030 Elevated blood-pressure reading, without diagnosis of hypertension: Secondary | ICD-10-CM

## 2020-11-18 LAB — COMPREHENSIVE METABOLIC PANEL
ALT: 22 U/L (ref 0–44)
AST: 22 U/L (ref 15–41)
Albumin: 3.5 g/dL (ref 3.5–5.0)
Alkaline Phosphatase: 68 U/L (ref 38–126)
Anion gap: 10 (ref 5–15)
BUN: 14 mg/dL (ref 8–23)
CO2: 24 mmol/L (ref 22–32)
Calcium: 9.1 mg/dL (ref 8.9–10.3)
Chloride: 102 mmol/L (ref 98–111)
Creatinine, Ser: 0.75 mg/dL (ref 0.44–1.00)
GFR, Estimated: >60 mL/min (ref >60)
Glucose, Bld: 108 mg/dL — ABNORMAL HIGH (ref 70–99)
Potassium: 3.9 mmol/L (ref 3.5–5.1)
Sodium: 136 mmol/L (ref 135–145)
Total Bilirubin: 0.3 mg/dL (ref 0.3–1.2)
Total Protein: 7 g/dL (ref 6.5–8.1)

## 2020-11-18 LAB — CBC WITH DIFFERENTIAL/PLATELET
Abs Immature Granulocytes: 0.01 10*3/uL (ref 0.00–0.07)
Basophils Absolute: 0 10*3/uL (ref 0.0–0.1)
Basophils Relative: 0 %
Eosinophils Absolute: 0.1 10*3/uL (ref 0.0–0.5)
Eosinophils Relative: 3 %
HCT: 36.4 % (ref 36.0–46.0)
Hemoglobin: 11.8 g/dL — ABNORMAL LOW (ref 12.0–15.0)
Immature Granulocytes: 0 %
Lymphocytes Relative: 40 %
Lymphs Abs: 2 10*3/uL (ref 0.7–4.0)
MCH: 28.5 pg (ref 26.0–34.0)
MCHC: 32.4 g/dL (ref 30.0–36.0)
MCV: 87.9 fL (ref 80.0–100.0)
Monocytes Absolute: 0.5 10*3/uL (ref 0.1–1.0)
Monocytes Relative: 9 %
Neutro Abs: 2.4 10*3/uL (ref 1.7–7.7)
Neutrophils Relative %: 48 %
Platelets: 238 10*3/uL (ref 150–400)
RBC: 4.14 MIL/uL (ref 3.87–5.11)
RDW: 13.5 % (ref 11.5–15.5)
WBC: 5 10*3/uL (ref 4.0–10.5)
nRBC: 0 % (ref 0.0–0.2)

## 2020-11-19 ENCOUNTER — Encounter: Payer: Self-pay | Admitting: Hematology and Oncology

## 2020-11-19 DIAGNOSIS — R030 Elevated blood-pressure reading, without diagnosis of hypertension: Secondary | ICD-10-CM | POA: Insufficient documentation

## 2020-11-19 NOTE — Progress Notes (Signed)
[place] OFFICE PROGRESS NOTE  Patient Care Team: Tisovec, Fransico Him, MD as PCP - General (Internal Medicine) Heath Lark, MD as Consulting Physician (Hematology and Oncology) Jodi Marble, MD as Consulting Physician (Otolaryngology) Eppie Gibson, MD as Attending Physician (Radiation Oncology)  ASSESSMENT & PLAN:  Diffuse large B-cell lymphoma of lymph nodes of neck [facility]) She has no signs of recurrence The patient is considered cured, 5 years out from last treatment The patient is educated to watch out for signs and symptoms of cancer recurrence She is comfortable to just follow-up with primary care doctor for future follow-up  Obesity, Class III, BMI 40-49.9 (morbid obesity) ([place]) We discussed importance of weight loss and association between obesity and lymphoma relapse She appears motivated to start working on dietary change and exercise  Elevated BP without diagnosis of hypertension Likely due to anxiety We discussed the importance of regular follow-up with primary care doctor for other risk factor modification  No orders of the defined types were placed in this encounter.   All questions were answered. The patient knows to call the clinic with any problems, questions or concerns. The total time spent in the appointment was 20 minutes encounter with patients including review of chart and various tests results, discussions about plan of care and coordination of care plan   Heath Lark, MD 11/19/2020 10:25 AM  INTERVAL HISTORY: Please see below for problem oriented charting. she returns for surveillance follow-up for history of diffuse large B-cell lymphoma Since last time I saw her, she is doing well Unfortunately, due to sedentary lifestyle, she was not able to lose weight No recent infection, fever or chills She is up-to-date with her screening programs and vaccination  REVIEW OF SYSTEMS:   Constitutional: Denies fevers, chills or abnormal weight  loss Eyes: Denies blurriness of vision Ears, nose, mouth, throat, and face: Denies mucositis or sore throat Respiratory: Denies cough, dyspnea or wheezes Cardiovascular: Denies palpitation, chest discomfort or lower extremity swelling Gastrointestinal:  Denies nausea, heartburn or change in bowel habits Skin: Denies abnormal skin rashes Lymphatics: Denies new lymphadenopathy or easy bruising Neurological:Denies numbness, tingling or new weaknesses Behavioral/Psych: Mood is stable, no new changes  All other systems were reviewed with the patient and are negative.  I have reviewed the past medical history, past surgical history, social history and family history with the patient and they are unchanged from previous note.  ALLERGIES:  is allergic to latex, penicillins, and sulfa antibiotics.  MEDICATIONS:  Current Outpatient Medications  Medication Sig Dispense Refill   Ascorbic Acid (VITAMIN C PO) Take 1 tablet by mouth daily.     Cholecalciferol (VITAMIN D PO) Take 1 tablet by mouth daily.     Multiple Vitamin (MULTIVITAMIN) tablet Take 1 tablet by mouth daily.     No current facility-administered medications for this visit.    SUMMARY OF ONCOLOGIC HISTORY: Oncology History  Diffuse large B-cell lymphoma of lymph nodes of neck ([place])  09/24/2015 Pathology Results   Final Cytologic Interpretation [id number]: Right level II neck mass, Fine Needle Aspiration I (smears and Thinprep): Malignant cells present, most consistent with large cell lymphoma.     09/24/2015 Pathology Results   [phone] The nasopharyngeal biopsy shows lymphoid tissue with diffuse effacement of the normal nodal architecture by large cells with vesicular nuclei, prominent nucleoli, and scant cytoplasm. The large cells are positive for CD20, BCL-6, BCL-2, CD5, and MUM1 by immunohistochemistry. They are negative for CD30, CD3, CD10, cyclinD1 and SOX11. In situ hybridization for  EBV mRNA is negative. Ki-67 reveals a  proliferation rate of approximately 60%. By flow cytometry, the atypical cells express CD19, CD5, and CD23 (see below). The expression of CD5 and CD23 by flow cytometry is suggestive of small lymphocytic lymphoma/ chronic lymphocytic leukemia (SLL/CLL) transforming to a diffuse large B-cell lymphoma (DLBCL); however, there was no evidence of SLL/CLL in the flow sample. Correlation with peripheral blood/ marrow findings would be useful to determine if this lymphoma arose from background SLL/CLL or is a de novo CD5+ DLBCL. De novo CD5+ DLBCL is frequently associated with an aggressive clinical course and poor response to chemotherapy. A pleomorphic mantle cell lymphoma is less likely since the cells are negative for Cyclin D1 and SOX11. FISH studies are pending and will be reported as an addendum.    09/30/2015 Imaging   CT scan of neck showed bulky RIGHT neck lymphadenopathy concerning for metastatic disease. 8 mm submucosal RIGHT base of tongue mass, considering ipsilateral lymphadenopathy, this is suspicious for primary head and neck cancer. Isodense LEFT palatine tonsillar mass versus focal lymphoid hyperplasia resulting in LEFT postobstructive middle ear/mastoid effusion.     10/03/2015 Imaging   ECHO showed normal EF   10/08/2015 PET scan   Hypermetabolic left nasopharyngeal and tongue base masses with hypermetabolic bilateral neck adenopathy. Right level 2 adenopathy extends inferiorly into the low right internal jugular station. No additional evidence of metastatic disease in the chest, abdomen or pelvis.   10/09/2015 Procedure   Successful placement of a left internal jugular approach power injectable Port-A-Cath. The catheter is ready for immediate use.   10/13/2015 - 11/24/2015 Chemotherapy   She received 3 cycles of chemotherapy with R-CHOP   12/15/2015 PET scan   Interval complete resolution of hypermetabolic activity associated with the left tonsillar mass and multiple cervical lymph nodes  consistent with complete remission. No residual hypermetabolic activity demonstrated.   01/01/2016 - 01/27/2016 Radiation Therapy   Received IMRT to oropharynx / 30.6 Gy in 17 fractions to involved sites of Head and Neck.       05/13/2016 PET scan   1. No recurrent abnormal hypermetabolic activity or adenopathy to suggest recurrent active lymphoma. 2. 2 by 3 mm left lower lobe pulmonary nodule, no change from earliest available comparison of 10/25/2015, not visibly hypermetabolic but below sensitive PET-CT size thresholds, very likely to be benign but merits surveillance on any follow up. 3. Tiny focus of hypermetabolic activity along the cutaneous surface of the right perineum without CT correlate, likely from contamination from minimal urinary incontinence. 4.  Aortic Atherosclerosis (ICD10-I70.0).   05/31/2016 Procedure   Technically successful tunneled Port catheter removal.     PHYSICAL EXAMINATION: ECOG PERFORMANCE STATUS: 0 - Asymptomatic  Vitals:   11/18/20 1310  BP: (!) 146/85  Pulse: 70  Resp: 18  Temp: (!) 97.5 F (36.4 C)  SpO2: 100%   Filed Weights   11/18/20 1310  Weight: 229 lb (103.9 kg)    GENERAL:alert, no distress and comfortable SKIN: skin color, texture, turgor are normal, no rashes or significant lesions EYES: normal, Conjunctiva are pink and non-injected, sclera clear OROPHARYNX:no exudate, no erythema and lips, buccal mucosa, and tongue normal  NECK: supple, thyroid normal size, non-tender, without nodularity LYMPH:  no palpable lymphadenopathy in the cervical, axillary or inguinal LUNGS: clear to auscultation and percussion with normal breathing effort HEART: regular rate & rhythm and no murmurs and no lower extremity edema ABDOMEN:abdomen soft, non-tender and normal bowel sounds Musculoskeletal:no cyanosis of digits and no clubbing  NEURO: alert & oriented x 3 with fluent speech, no focal motor/sensory deficits  LABORATORY DATA:  I have reviewed the  data as listed    Component Value Date/Time   NA 136 11/18/2020 1252   NA 138 08/13/2016 0955   K 3.9 11/18/2020 1252   K 4.1 08/13/2016 0955   CL 102 11/18/2020 1252   CO2 24 11/18/2020 1252   CO2 27 08/13/2016 0955   GLUCOSE 108 (H) 11/18/2020 1252   GLUCOSE 97 08/13/2016 0955   BUN 14 11/18/2020 1252   BUN 14.9 08/13/2016 0955   CREATININE 0.75 11/18/2020 1252   CREATININE 0.8 08/13/2016 0955   CALCIUM 9.1 11/18/2020 1252   CALCIUM 9.2 08/13/2016 0955   PROT 7.0 11/18/2020 1252   PROT 6.9 08/13/2016 0955   ALBUMIN 3.5 11/18/2020 1252   ALBUMIN 3.4 (L) 08/13/2016 0955   AST 22 11/18/2020 1252   AST 18 08/13/2016 0955   ALT 22 11/18/2020 1252   ALT 19 08/13/2016 0955   ALKPHOS 68 11/18/2020 1252   ALKPHOS 69 08/13/2016 0955   BILITOT 0.3 11/18/2020 1252   BILITOT 0.41 08/13/2016 0955   GFRNONAA >60 11/18/2020 1252   GFRAA >60 11/21/2018 0834    No results found for: SPEP, UPEP  Lab Results  Component Value Date   WBC 5.0 11/18/2020   NEUTROABS 2.4 11/18/2020   HGB 11.8 (L) 11/18/2020   HCT 36.4 11/18/2020   MCV 87.9 11/18/2020   PLT 238 11/18/2020      Chemistry      Component Value Date/Time   NA 136 11/18/2020 1252   NA 138 08/13/2016 0955   K 3.9 11/18/2020 1252   K 4.1 08/13/2016 0955   CL 102 11/18/2020 1252   CO2 24 11/18/2020 1252   CO2 27 08/13/2016 0955   BUN 14 11/18/2020 1252   BUN 14.9 08/13/2016 0955   CREATININE 0.75 11/18/2020 1252   CREATININE 0.8 08/13/2016 0955      Component Value Date/Time   CALCIUM 9.1 11/18/2020 1252   CALCIUM 9.2 08/13/2016 0955   ALKPHOS 68 11/18/2020 1252   ALKPHOS 69 08/13/2016 0955   AST 22 11/18/2020 1252   AST 18 08/13/2016 0955   ALT 22 11/18/2020 1252   ALT 19 08/13/2016 0955   BILITOT 0.3 11/18/2020 1252   BILITOT 0.41 08/13/2016 0955

## 2020-11-19 NOTE — Assessment & Plan Note (Signed)
Likely due to anxiety We discussed the importance of regular follow-up with primary care doctor for other risk factor modification

## 2020-11-19 NOTE — Assessment & Plan Note (Signed)
We discussed importance of weight loss and association between obesity and lymphoma relapse She appears motivated to start working on dietary change and exercise

## 2020-11-19 NOTE — Assessment & Plan Note (Signed)
She has no signs of recurrence The patient is considered cured, 5 years out from last treatment The patient is educated to watch out for signs and symptoms of cancer recurrence She is comfortable to just follow-up with primary care doctor for future follow-up

## 2020-11-25 DIAGNOSIS — R739 Hyperglycemia, unspecified: Secondary | ICD-10-CM | POA: Diagnosis not present

## 2020-11-25 DIAGNOSIS — C8331 Diffuse large B-cell lymphoma, lymph nodes of head, face, and neck: Secondary | ICD-10-CM | POA: Diagnosis not present

## 2020-11-25 DIAGNOSIS — E7800 Pure hypercholesterolemia, unspecified: Secondary | ICD-10-CM | POA: Diagnosis not present

## 2020-12-05 DIAGNOSIS — Z01419 Encounter for gynecological examination (general) (routine) without abnormal findings: Secondary | ICD-10-CM | POA: Diagnosis not present

## 2020-12-05 DIAGNOSIS — Z6841 Body Mass Index (BMI) 40.0 and over, adult: Secondary | ICD-10-CM | POA: Diagnosis not present

## 2021-05-25 DIAGNOSIS — R739 Hyperglycemia, unspecified: Secondary | ICD-10-CM | POA: Diagnosis not present

## 2021-05-25 DIAGNOSIS — E7800 Pure hypercholesterolemia, unspecified: Secondary | ICD-10-CM | POA: Diagnosis not present

## 2021-05-28 DIAGNOSIS — C8331 Diffuse large B-cell lymphoma, lymph nodes of head, face, and neck: Secondary | ICD-10-CM | POA: Diagnosis not present

## 2021-05-28 DIAGNOSIS — R82998 Other abnormal findings in urine: Secondary | ICD-10-CM | POA: Diagnosis not present

## 2021-05-28 DIAGNOSIS — Z1331 Encounter for screening for depression: Secondary | ICD-10-CM | POA: Diagnosis not present

## 2021-05-28 DIAGNOSIS — Z0000 Encounter for general adult medical examination without abnormal findings: Secondary | ICD-10-CM | POA: Diagnosis not present

## 2021-05-28 DIAGNOSIS — Z1339 Encounter for screening examination for other mental health and behavioral disorders: Secondary | ICD-10-CM | POA: Diagnosis not present

## 2021-06-15 DIAGNOSIS — K648 Other hemorrhoids: Secondary | ICD-10-CM | POA: Diagnosis not present

## 2021-06-15 DIAGNOSIS — Z1211 Encounter for screening for malignant neoplasm of colon: Secondary | ICD-10-CM | POA: Diagnosis not present

## 2021-06-15 DIAGNOSIS — K5730 Diverticulosis of large intestine without perforation or abscess without bleeding: Secondary | ICD-10-CM | POA: Diagnosis not present

## 2021-07-21 ENCOUNTER — Other Ambulatory Visit: Payer: Self-pay | Admitting: Internal Medicine

## 2021-07-21 DIAGNOSIS — Z1231 Encounter for screening mammogram for malignant neoplasm of breast: Secondary | ICD-10-CM

## 2021-09-04 ENCOUNTER — Ambulatory Visit: Payer: Federal, State, Local not specified - PPO

## 2021-09-11 ENCOUNTER — Ambulatory Visit
Admission: RE | Admit: 2021-09-11 | Discharge: 2021-09-11 | Disposition: A | Discharge status: Home / Self Care | Payer: Federal, State, Local not specified - PPO | Source: Ambulatory Visit | Attending: Internal Medicine | Admitting: Internal Medicine

## 2021-09-11 DIAGNOSIS — Z1231 Encounter for screening mammogram for malignant neoplasm of breast: Secondary | ICD-10-CM

## 2021-12-04 DIAGNOSIS — Z01419 Encounter for gynecological examination (general) (routine) without abnormal findings: Secondary | ICD-10-CM | POA: Diagnosis not present

## 2021-12-04 DIAGNOSIS — Z6839 Body mass index (BMI) 39.0-39.9, adult: Secondary | ICD-10-CM | POA: Diagnosis not present

## 2021-12-04 DIAGNOSIS — Z1211 Encounter for screening for malignant neoplasm of colon: Secondary | ICD-10-CM | POA: Diagnosis not present

## 2021-12-04 DIAGNOSIS — Z1239 Encounter for other screening for malignant neoplasm of breast: Secondary | ICD-10-CM | POA: Diagnosis not present

## 2021-12-07 DIAGNOSIS — C8331 Diffuse large B-cell lymphoma, lymph nodes of head, face, and neck: Secondary | ICD-10-CM | POA: Diagnosis not present

## 2021-12-07 DIAGNOSIS — R739 Hyperglycemia, unspecified: Secondary | ICD-10-CM | POA: Diagnosis not present

## 2021-12-07 DIAGNOSIS — I8390 Asymptomatic varicose veins of unspecified lower extremity: Secondary | ICD-10-CM | POA: Diagnosis not present

## 2021-12-30 DIAGNOSIS — L723 Sebaceous cyst: Secondary | ICD-10-CM | POA: Diagnosis not present

## 2022-05-04 DIAGNOSIS — H938X2 Other specified disorders of left ear: Secondary | ICD-10-CM | POA: Diagnosis not present

## 2022-05-04 DIAGNOSIS — J329 Chronic sinusitis, unspecified: Secondary | ICD-10-CM | POA: Diagnosis not present

## 2022-05-20 DIAGNOSIS — H93292 Other abnormal auditory perceptions, left ear: Secondary | ICD-10-CM | POA: Diagnosis not present

## 2022-05-21 DIAGNOSIS — J3489 Other specified disorders of nose and nasal sinuses: Secondary | ICD-10-CM | POA: Diagnosis not present

## 2022-05-21 DIAGNOSIS — R059 Cough, unspecified: Secondary | ICD-10-CM | POA: Diagnosis not present

## 2022-06-08 DIAGNOSIS — D72819 Decreased white blood cell count, unspecified: Secondary | ICD-10-CM | POA: Diagnosis not present

## 2022-06-08 DIAGNOSIS — R7989 Other specified abnormal findings of blood chemistry: Secondary | ICD-10-CM | POA: Diagnosis not present

## 2022-06-08 DIAGNOSIS — R739 Hyperglycemia, unspecified: Secondary | ICD-10-CM | POA: Diagnosis not present

## 2022-06-08 DIAGNOSIS — E7800 Pure hypercholesterolemia, unspecified: Secondary | ICD-10-CM | POA: Diagnosis not present

## 2022-06-08 DIAGNOSIS — D649 Anemia, unspecified: Secondary | ICD-10-CM | POA: Diagnosis not present

## 2022-06-15 DIAGNOSIS — Z1339 Encounter for screening examination for other mental health and behavioral disorders: Secondary | ICD-10-CM | POA: Diagnosis not present

## 2022-06-15 DIAGNOSIS — R82998 Other abnormal findings in urine: Secondary | ICD-10-CM | POA: Diagnosis not present

## 2022-06-15 DIAGNOSIS — Z1331 Encounter for screening for depression: Secondary | ICD-10-CM | POA: Diagnosis not present

## 2022-06-15 DIAGNOSIS — C8331 Diffuse large B-cell lymphoma, lymph nodes of head, face, and neck: Secondary | ICD-10-CM | POA: Diagnosis not present

## 2022-06-15 DIAGNOSIS — Z0000 Encounter for general adult medical examination without abnormal findings: Secondary | ICD-10-CM | POA: Diagnosis not present

## 2022-08-05 DIAGNOSIS — N644 Mastodynia: Secondary | ICD-10-CM | POA: Diagnosis not present

## 2022-08-09 ENCOUNTER — Other Ambulatory Visit: Payer: Self-pay | Admitting: Obstetrics and Gynecology

## 2022-08-09 DIAGNOSIS — N644 Mastodynia: Secondary | ICD-10-CM

## 2022-08-24 ENCOUNTER — Ambulatory Visit: Admission: RE | Admit: 2022-08-24 | Payer: Federal, State, Local not specified - PPO | Source: Ambulatory Visit

## 2022-08-24 ENCOUNTER — Ambulatory Visit
Admission: RE | Admit: 2022-08-24 | Discharge: 2022-08-24 | Disposition: A | Discharge status: Home / Self Care | Payer: Federal, State, Local not specified - PPO | Source: Ambulatory Visit | Attending: Obstetrics and Gynecology | Admitting: Obstetrics and Gynecology

## 2022-08-24 DIAGNOSIS — N644 Mastodynia: Secondary | ICD-10-CM

## 2022-09-02 ENCOUNTER — Other Ambulatory Visit: Payer: Self-pay | Admitting: Internal Medicine

## 2022-09-02 DIAGNOSIS — Z1231 Encounter for screening mammogram for malignant neoplasm of breast: Secondary | ICD-10-CM

## 2022-09-17 ENCOUNTER — Ambulatory Visit
Admission: RE | Admit: 2022-09-17 | Discharge: 2022-09-17 | Disposition: A | Discharge status: Home / Self Care | Payer: Federal, State, Local not specified - PPO | Source: Ambulatory Visit | Attending: Internal Medicine | Admitting: Internal Medicine

## 2022-09-17 DIAGNOSIS — Z1231 Encounter for screening mammogram for malignant neoplasm of breast: Secondary | ICD-10-CM

## 2022-11-02 DIAGNOSIS — J323 Chronic sphenoidal sinusitis: Secondary | ICD-10-CM | POA: Diagnosis not present

## 2022-11-17 ENCOUNTER — Other Ambulatory Visit (HOSPITAL_COMMUNITY): Payer: Self-pay | Admitting: Family Medicine

## 2022-11-17 ENCOUNTER — Ambulatory Visit (HOSPITAL_COMMUNITY)
Admission: RE | Admit: 2022-11-17 | Discharge: 2022-11-17 | Disposition: A | Discharge status: Home / Self Care | Payer: Federal, State, Local not specified - PPO | Source: Ambulatory Visit | Attending: Cardiovascular Disease | Admitting: Cardiovascular Disease

## 2022-11-17 DIAGNOSIS — M79605 Pain in left leg: Secondary | ICD-10-CM

## 2022-12-08 DIAGNOSIS — Z124 Encounter for screening for malignant neoplasm of cervix: Secondary | ICD-10-CM | POA: Diagnosis not present

## 2022-12-08 DIAGNOSIS — R87810 Cervical high risk human papillomavirus (HPV) DNA test positive: Secondary | ICD-10-CM | POA: Diagnosis not present

## 2022-12-08 DIAGNOSIS — Z1339 Encounter for screening examination for other mental health and behavioral disorders: Secondary | ICD-10-CM | POA: Diagnosis not present

## 2022-12-08 DIAGNOSIS — Z01419 Encounter for gynecological examination (general) (routine) without abnormal findings: Secondary | ICD-10-CM | POA: Diagnosis not present

## 2022-12-21 DIAGNOSIS — C8331 Diffuse large B-cell lymphoma, lymph nodes of head, face, and neck: Secondary | ICD-10-CM | POA: Diagnosis not present

## 2022-12-21 DIAGNOSIS — R739 Hyperglycemia, unspecified: Secondary | ICD-10-CM | POA: Diagnosis not present

## 2022-12-28 DIAGNOSIS — M25562 Pain in left knee: Secondary | ICD-10-CM | POA: Diagnosis not present

## 2022-12-28 DIAGNOSIS — M1712 Unilateral primary osteoarthritis, left knee: Secondary | ICD-10-CM | POA: Diagnosis not present

## 2023-01-24 DIAGNOSIS — R896 Abnormal cytological findings in specimens from other organs, systems and tissues: Secondary | ICD-10-CM | POA: Diagnosis not present

## 2023-02-04 ENCOUNTER — Other Ambulatory Visit: Payer: Self-pay | Admitting: Podiatry

## 2023-02-04 ENCOUNTER — Ambulatory Visit (INDEPENDENT_AMBULATORY_CARE_PROVIDER_SITE_OTHER): Payer: BC Managed Care – PPO | Admitting: Podiatry

## 2023-02-04 ENCOUNTER — Encounter: Payer: Self-pay | Admitting: Podiatry

## 2023-02-04 ENCOUNTER — Ambulatory Visit (INDEPENDENT_AMBULATORY_CARE_PROVIDER_SITE_OTHER): Payer: BC Managed Care – PPO

## 2023-02-04 VITALS — Ht 63.0 in | Wt 225.0 lb

## 2023-02-04 DIAGNOSIS — M76821 Posterior tibial tendinitis, right leg: Secondary | ICD-10-CM

## 2023-02-04 DIAGNOSIS — M2142 Flat foot [pes planus] (acquired), left foot: Secondary | ICD-10-CM | POA: Diagnosis not present

## 2023-02-04 DIAGNOSIS — M778 Other enthesopathies, not elsewhere classified: Secondary | ICD-10-CM

## 2023-02-04 DIAGNOSIS — M21612 Bunion of left foot: Secondary | ICD-10-CM

## 2023-02-04 DIAGNOSIS — M76822 Posterior tibial tendinitis, left leg: Secondary | ICD-10-CM

## 2023-02-04 DIAGNOSIS — M2141 Flat foot [pes planus] (acquired), right foot: Secondary | ICD-10-CM | POA: Diagnosis not present

## 2023-02-04 DIAGNOSIS — M21611 Bunion of right foot: Secondary | ICD-10-CM

## 2023-02-04 MED ORDER — METHYLPREDNISOLONE 4 MG PO TBPK
ORAL_TABLET | ORAL | 0 refills | Status: AC
Start: 2023-02-04 — End: ?

## 2023-02-04 MED ORDER — MELOXICAM 15 MG PO TABS
15.0000 mg | ORAL_TABLET | Freq: Every day | ORAL | 0 refills | Status: AC
Start: 2023-02-04 — End: ?

## 2023-02-04 NOTE — Progress Notes (Signed)
 Chief Complaint  Patient presents with   Foot Pain    She is here to establish care and she had a cortisone injection in November at her orthopedic doctor and has noticed pain since the last injection .    HPI: 63 y.o. female presents today with aggravation of bilateral flatfoot pain.  Right worse than left.  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 She has been treated in this office several years prior for plantar fasciitis. Also has history of knee arthritis and recently had injection for this in November.  Past Medical History:  Diagnosis Date   Dry mouth    History of chemotherapy    History of radiation therapy 01/01/2016- 01/27/2016   Oropharynx   Lymphoma (HCC)     Past Surgical History:  Procedure Laterality Date   BREAST CYST ASPIRATION Right 01/24/2015   BREAST EXCISIONAL BIOPSY Right    BREAST SURGERY  01.17.2017   incise and drain right breast abscess   CESAREAN SECTION  09.10.1996   CHOLECYSTECTOMY     INCISION AND DRAINAGE ABSCESS Right 02/11/2015   Procedure: INCISION AND DRAINAGE BREAST ABSCESS, RIGHT  BREAST BIOPSY;  Surgeon: Elon Pacini, MD;  Location: [facility];  Service: General;  Laterality: Right;   IR GENERIC HISTORICAL  10/09/2015   IR FLUORO GUIDE PORT INSERTION LEFT 10/09/2015 Norleen Roulette, MD [facility]   IR GENERIC HISTORICAL  10/09/2015   IR US  GUIDE VASC ACCESS LEFT 10/09/2015 Norleen Roulette, MD [facility]   IR REMOVAL TUN ACCESS W/ PORT W/O FL MOD SED  05/31/2016   TONSILLECTOMY      Allergies  Allergen Reactions   Latex Rash   Penicillins Rash    Has patient had a PCN reaction causing immediate rash, facial/tongue/throat swelling, SOB or lightheadedness with hypotension: no Has patient had a PCN reaction causing severe rash involving mucus membranes or skin necrosis: unknown Has patient had a PCN reaction that required hospitalization : no Has patient had a PCN reaction occurring within the last 10 years: no If all of  the above answers are NO, then may proceed with Cephalosporin use.    Sulfa Antibiotics Rash    ROS denies any nausea, vomiting fever, chills, chest pain, shortness of breath   Physical Exam: There were no vitals filed for this visit.  General: The patient is alert and oriented x3 in no acute distress.  Dermatology: Skin is warm, dry and supple bilateral lower extremities. Interspaces are clear of maceration and debris.    Vascular: Palpable pedal pulses bilaterally. Capillary refill within normal limits.  No appreciable edema.  No erythema or calor.  Neurological: Light touch sensation grossly intact bilateral feet.   Musculoskeletal Exam: Bilateral pes plano valgus with bunion deformities.  Pain on palpation of PT tendon at the level of navicular tuberosity right greater than left.  Tenderness on palpation of the spring ligament right greater than left.  Equinus noted with improvement of ankle joint dorsiflexion with knee flexed.  Pes planus does appear flexible.  Pain to the right foot with double heel rise.  Increased forefoot abduction in stance. Good range of motion of STJ and midfoot with increased eversion and pronation.  Radiographic Exam: Left and right foot 02/04/2023 Normal osseous mineralization. Joint spaces preserved.  No fractures appreciated.  Significant pes planovalgus.  Near negative calcaneal inclination angle bilaterally. 50% talar head uncoverage bilaterally.  Negative Meary's angle bilaterally. Increased cuboid abduction angle.  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  Deviation of the left first MPJ.  Assessment/Plan of Care: 1. Capsulitis of foot   2. Pes planus of both feet   3. Posterior tibial tendon dysfunction (PTTD) of both lower extremities      Meds ordered this encounter  Medications   methylPREDNISolone  (MEDROL  DOSEPAK) 4 MG TBPK tablet    Sig: 6 Day Tapering Dose    Dispense:  21  tablet    Refill:  0   meloxicam  (MOBIC ) 15 MG tablet    Sig: Take 1 tablet (15 mg total) by mouth daily.    Dispense:  30 tablet    Refill:  0   FOR HOME USE ONLY DME OTHER SEE COMMENT  Discussed clinical findings with patient today.  -Discussed the extent of the patient's deformity.  She is flexible at this point, could potentially tolerate bracing.  Did state that otherwise she would potentially need flatfoot reconstructive surgery which may require joint fusions - She would like to defer this.  Wishes to proceed with conservative treatment - Emphasized importance of good supportive shoe gear.  Recommend the use of over-the-counter power step inserts for now -Placing order for custom Richie PTTD brace. May try over the counter options if cost prohibitive. -Starting patient on 6-day Medrol  Dosepak followed by 3-week course of meloxicam  -Return to clinic in approximately 4 weeks to monitor progression.   Eva Griffo L. Lamount MAUL, AACFAS [facility]     [address] [place], [place] [address]                Office [phone]  Fax [phone]

## 2023-02-08 DIAGNOSIS — M25562 Pain in left knee: Secondary | ICD-10-CM | POA: Diagnosis not present

## 2023-03-02 ENCOUNTER — Ambulatory Visit (INDEPENDENT_AMBULATORY_CARE_PROVIDER_SITE_OTHER): Payer: Federal, State, Local not specified - PPO

## 2023-03-02 DIAGNOSIS — M2142 Flat foot [pes planus] (acquired), left foot: Secondary | ICD-10-CM

## 2023-03-02 DIAGNOSIS — M76821 Posterior tibial tendinitis, right leg: Secondary | ICD-10-CM | POA: Diagnosis not present

## 2023-03-02 NOTE — Progress Notes (Signed)
 Patient was present and evaluated for Right Richie brace, Patient reports buying the DJO richie style online and states she was not able to tolerate and soul not fit into her shoes  I showed her the custom richie and she does not believe she will like it much more.  I then went and got a DJO lace up figure 8 ankle support and fit her with that  She was very happy with this Provides ankle and M-L support decreases pain and fit nicely inside shoe   Patient will wear and will call office if any problems arise  Patient was given wear and care instructions as well  Charlotte Davidson Cped, CFo, CFm

## 2023-03-04 ENCOUNTER — Ambulatory Visit: Payer: BC Managed Care – PPO | Admitting: Podiatry

## 2023-03-25 ENCOUNTER — Ambulatory Visit (INDEPENDENT_AMBULATORY_CARE_PROVIDER_SITE_OTHER): Payer: Federal, State, Local not specified - PPO | Admitting: Podiatry

## 2023-03-25 ENCOUNTER — Encounter: Payer: Self-pay | Admitting: Podiatry

## 2023-03-25 VITALS — Ht 63.0 in | Wt 225.0 lb

## 2023-03-25 DIAGNOSIS — M2141 Flat foot [pes planus] (acquired), right foot: Secondary | ICD-10-CM

## 2023-03-25 DIAGNOSIS — M76822 Posterior tibial tendinitis, left leg: Secondary | ICD-10-CM

## 2023-03-25 DIAGNOSIS — M2142 Flat foot [pes planus] (acquired), left foot: Secondary | ICD-10-CM

## 2023-03-25 DIAGNOSIS — M76821 Posterior tibial tendinitis, right leg: Secondary | ICD-10-CM | POA: Diagnosis not present

## 2023-03-25 NOTE — Progress Notes (Signed)
 Chief Complaint  Patient presents with   Foot Pain    Pt is here to f/u she states the pain is better continues to wear tri lock brace on right foot.    HPI: 63 y.o. female presents today for follow-up evaluation of bilateral PTTD.  She has been using the braces.  She has had significant improvement.  She has been doing well at this time with use of the power steps.  She did have evaluation for custom brace, after trying DJO Richie style brace.  She is decided to continue with DJO lace up ankle brace and has been doing well with this.  Past Medical History:  Diagnosis Date   Dry mouth    History of chemotherapy    History of radiation therapy 01/01/2016- 01/27/2016   Oropharynx   Lymphoma (HCC)     Past Surgical History:  Procedure Laterality Date   BREAST CYST ASPIRATION Right 01/24/2015   BREAST EXCISIONAL BIOPSY Right    BREAST SURGERY  01.17.2017   incise and drain right breast abscess   CESAREAN SECTION  09.10.1996   CHOLECYSTECTOMY     INCISION AND DRAINAGE ABSCESS Right 02/11/2015   Procedure: INCISION AND DRAINAGE BREAST ABSCESS, RIGHT  BREAST BIOPSY;  Surgeon: Claud Kelp, MD;  Location: [facility];  Service: General;  Laterality: Right;   IR GENERIC HISTORICAL  10/09/2015   IR FLUORO GUIDE PORT INSERTION LEFT 10/09/2015 Simonne Come, MD [facility]   IR GENERIC HISTORICAL  10/09/2015   IR US GUIDE VASC ACCESS LEFT 10/09/2015 Simonne Come, MD [facility]   IR REMOVAL TUN ACCESS W/ PORT W/O FL MOD SED  05/31/2016   TONSILLECTOMY      Allergies  Allergen Reactions   Latex Rash   Penicillins Rash    Has patient had a PCN reaction causing immediate rash, facial/tongue/throat swelling, SOB or lightheadedness with hypotension: no Has patient had a PCN reaction causing severe rash involving mucus membranes or skin necrosis: unknown Has patient had a PCN reaction that required hospitalization : no Has patient had a PCN reaction occurring within the last 10 years: no If all  of the above answers are "NO", then may proceed with Cephalosporin use.    Sulfa Antibiotics Rash    ROS denies any nausea, vomiting fever, chills, chest pain, shortness of breath   Physical Exam: There were no vitals filed for this visit.  General: The patient is alert and oriented x3 in no acute distress.  Dermatology: Skin is warm, dry and supple bilateral lower extremities. Interspaces are clear of maceration and debris.    Vascular: Palpable pedal pulses bilaterally. Capillary refill within normal limits.  No appreciable edema.  No erythema or calor.  Neurological: Light touch sensation grossly intact bilateral feet.   Musculoskeletal Exam: Bilateral pes plano valgus with bunion deformities.  Pain on palpation of PT tendon at the level of navicular tuberosity right greater than left.  Decreased tenderness on palpation of the spring ligament right greater than left.  Equinus noted with improvement of ankle joint dorsiflexion with knee flexed.  Pes planus does appear flexible.  Increased forefoot abduction in stance. Good range of motion of STJ and midfoot with increased eversion and pronation.  Radiographic Exam: Left and right foot 02/04/2023 Normal osseous mineralization. Joint spaces preserved.  No fractures appreciated.  Significant pes planovalgus.  Near negative calcaneal inclination angle bilaterally. 50% talar head uncoverage bilaterally.  Negative Meary's angle bilaterally. Increased cuboid abduction angle. Bilateral  HAV deformities with increased IM angle with sesamoid rotation and valgus drift of first toe.  Deviation of the left first MPJ.  Assessment/Plan of Care: 1. Pes planus of both feet   2. Posterior tibial tendon dysfunction (PTTD) of both lower extremities      No orders of the defined types were placed in this encounter.  None  Discussed clinical findings with patient today.  - Doing very well with braces for this at this point in time - Advised her to  continue wearing the braces for another 3 to 4 weeks.  After this can wean into power steps with good supportive shoes with power steps -We will continue with conservative care at this point in time -No further Rx for oral medication at this point in time - Can follow-up as needed at this point if symptoms recur, worsen or fail to improve.   Avantika Shere L. Marchia Bond, AACFAS [facility]     [address] [place], [place] [address]                Office [phone]  Fax [phone]

## 2023-06-22 DIAGNOSIS — R739 Hyperglycemia, unspecified: Secondary | ICD-10-CM | POA: Diagnosis not present

## 2023-06-22 DIAGNOSIS — E7800 Pure hypercholesterolemia, unspecified: Secondary | ICD-10-CM | POA: Diagnosis not present

## 2023-06-28 DIAGNOSIS — Z1331 Encounter for screening for depression: Secondary | ICD-10-CM | POA: Diagnosis not present

## 2023-06-28 DIAGNOSIS — Z1339 Encounter for screening examination for other mental health and behavioral disorders: Secondary | ICD-10-CM | POA: Diagnosis not present

## 2023-06-28 DIAGNOSIS — R82998 Other abnormal findings in urine: Secondary | ICD-10-CM | POA: Diagnosis not present

## 2023-06-28 DIAGNOSIS — Z0000 Encounter for general adult medical examination without abnormal findings: Secondary | ICD-10-CM | POA: Diagnosis not present

## 2023-06-28 DIAGNOSIS — C8331 Diffuse large B-cell lymphoma, lymph nodes of head, face, and neck: Secondary | ICD-10-CM | POA: Diagnosis not present

## 2023-07-07 DIAGNOSIS — R52 Pain, unspecified: Secondary | ICD-10-CM | POA: Diagnosis not present

## 2023-08-03 DIAGNOSIS — M67971 Unspecified disorder of synovium and tendon, right ankle and foot: Secondary | ICD-10-CM | POA: Diagnosis not present

## 2023-08-03 DIAGNOSIS — M25571 Pain in right ankle and joints of right foot: Secondary | ICD-10-CM | POA: Diagnosis not present

## 2023-08-03 DIAGNOSIS — M79671 Pain in right foot: Secondary | ICD-10-CM | POA: Diagnosis not present

## 2023-08-16 DIAGNOSIS — M25562 Pain in left knee: Secondary | ICD-10-CM | POA: Diagnosis not present

## 2023-08-22 DIAGNOSIS — M76821 Posterior tibial tendinitis, right leg: Secondary | ICD-10-CM | POA: Diagnosis not present

## 2023-08-22 DIAGNOSIS — M25571 Pain in right ankle and joints of right foot: Secondary | ICD-10-CM | POA: Diagnosis not present

## 2023-09-06 ENCOUNTER — Other Ambulatory Visit: Payer: Self-pay | Admitting: Internal Medicine

## 2023-09-06 DIAGNOSIS — Z1231 Encounter for screening mammogram for malignant neoplasm of breast: Secondary | ICD-10-CM

## 2023-09-22 ENCOUNTER — Ambulatory Visit
Admission: RE | Admit: 2023-09-22 | Discharge: 2023-09-22 | Disposition: A | Discharge status: Home / Self Care | Source: Ambulatory Visit | Attending: Internal Medicine | Admitting: Internal Medicine

## 2023-09-22 DIAGNOSIS — Z1231 Encounter for screening mammogram for malignant neoplasm of breast: Secondary | ICD-10-CM

## 2023-10-03 DIAGNOSIS — R051 Acute cough: Secondary | ICD-10-CM | POA: Diagnosis not present

## 2023-10-03 DIAGNOSIS — U071 COVID-19: Secondary | ICD-10-CM | POA: Diagnosis not present

## 2023-10-03 DIAGNOSIS — R52 Pain, unspecified: Secondary | ICD-10-CM | POA: Diagnosis not present

## 2023-10-14 ENCOUNTER — Other Ambulatory Visit: Payer: Self-pay | Admitting: Obstetrics and Gynecology

## 2023-10-14 DIAGNOSIS — Z1231 Encounter for screening mammogram for malignant neoplasm of breast: Secondary | ICD-10-CM

## 2023-12-09 DIAGNOSIS — J323 Chronic sphenoidal sinusitis: Secondary | ICD-10-CM | POA: Diagnosis not present

## 2023-12-19 DIAGNOSIS — R87810 Cervical high risk human papillomavirus (HPV) DNA test positive: Secondary | ICD-10-CM | POA: Diagnosis not present

## 2023-12-19 DIAGNOSIS — Z01419 Encounter for gynecological examination (general) (routine) without abnormal findings: Secondary | ICD-10-CM | POA: Diagnosis not present

## 2023-12-19 DIAGNOSIS — Z1330 Encounter for screening examination for mental health and behavioral disorders, unspecified: Secondary | ICD-10-CM | POA: Diagnosis not present

## 2023-12-21 DIAGNOSIS — M25562 Pain in left knee: Secondary | ICD-10-CM | POA: Diagnosis not present

## 2024-01-03 DIAGNOSIS — C8331 Diffuse large B-cell lymphoma, lymph nodes of head, face, and neck: Secondary | ICD-10-CM | POA: Diagnosis not present

## 2024-01-07 DIAGNOSIS — M25562 Pain in left knee: Secondary | ICD-10-CM | POA: Diagnosis not present
